# Patient Record
Sex: Female | Born: 1960 | Race: Black or African American | Hispanic: No | Smoking: Never smoker
Health system: Southern US, Community
[De-identification: ages and names within clinical notes are randomized; demographics above are authoritative.]

## PROBLEM LIST (undated history)

## (undated) DIAGNOSIS — Z8489 Family history of other specified conditions: Secondary | ICD-10-CM

## (undated) DIAGNOSIS — D649 Anemia, unspecified: Secondary | ICD-10-CM

## (undated) DIAGNOSIS — M5430 Sciatica, unspecified side: Secondary | ICD-10-CM

## (undated) DIAGNOSIS — M1990 Unspecified osteoarthritis, unspecified site: Secondary | ICD-10-CM

## (undated) DIAGNOSIS — R519 Headache, unspecified: Secondary | ICD-10-CM

## (undated) DIAGNOSIS — E119 Type 2 diabetes mellitus without complications: Secondary | ICD-10-CM

## (undated) DIAGNOSIS — K219 Gastro-esophageal reflux disease without esophagitis: Secondary | ICD-10-CM

## (undated) DIAGNOSIS — M5416 Radiculopathy, lumbar region: Secondary | ICD-10-CM

## (undated) DIAGNOSIS — G894 Chronic pain syndrome: Secondary | ICD-10-CM

## (undated) HISTORY — PX: OTHER SURGICAL HISTORY: SHX169

## (undated) HISTORY — DX: Sciatica, unspecified side: M54.30

## (undated) HISTORY — DX: Radiculopathy, lumbar region: M54.16

---

## 2011-04-17 HISTORY — PX: COLONOSCOPY: SHX174

## 2014-09-20 ENCOUNTER — Encounter: Payer: Self-pay | Admitting: General Practice

## 2014-09-20 ENCOUNTER — Emergency Department
Admission: EM | Admit: 2014-09-20 | Discharge: 2014-09-20 | Disposition: A | Discharge status: Home / Self Care | Payer: Worker's Compensation | Attending: Emergency Medicine | Admitting: Emergency Medicine

## 2014-09-20 DIAGNOSIS — M5442 Lumbago with sciatica, left side: Secondary | ICD-10-CM | POA: Insufficient documentation

## 2014-09-20 DIAGNOSIS — G8929 Other chronic pain: Secondary | ICD-10-CM | POA: Diagnosis not present

## 2014-09-20 DIAGNOSIS — E119 Type 2 diabetes mellitus without complications: Secondary | ICD-10-CM | POA: Diagnosis not present

## 2014-09-20 DIAGNOSIS — M545 Low back pain: Secondary | ICD-10-CM | POA: Diagnosis present

## 2014-09-20 DIAGNOSIS — M549 Dorsalgia, unspecified: Secondary | ICD-10-CM

## 2014-09-20 HISTORY — DX: Type 2 diabetes mellitus without complications: E11.9

## 2014-09-20 MED ORDER — KETOROLAC TROMETHAMINE 60 MG/2ML IM SOLN
INTRAMUSCULAR | Status: AC
  Filled 2014-09-20: qty 2

## 2014-09-20 MED ORDER — ORPHENADRINE CITRATE 30 MG/ML IJ SOLN
INTRAMUSCULAR | Status: AC
  Filled 2014-09-20: qty 2

## 2014-09-20 MED ORDER — KETOROLAC TROMETHAMINE 60 MG/2ML IM SOLN
60.0000 mg | Freq: Once | INTRAMUSCULAR | Status: AC
Start: 2014-09-20 — End: 2014-09-20
  Administered 2014-09-20: 60 mg via INTRAMUSCULAR

## 2014-09-20 MED ORDER — ORPHENADRINE CITRATE 30 MG/ML IJ SOLN
60.0000 mg | Freq: Two times a day (BID) | INTRAMUSCULAR | Status: DC
  Administered 2014-09-20: 60 mg via INTRAMUSCULAR

## 2014-09-20 MED ORDER — CYCLOBENZAPRINE HCL 10 MG PO TABS: 10.0000 mg | ORAL_TABLET | Freq: Three times a day (TID) | ORAL | Status: DC | PRN

## 2014-09-20 MED ORDER — MELOXICAM 15 MG PO TABS: 15.0000 mg | ORAL_TABLET | Freq: Every day | ORAL | Status: DC | PRN

## 2014-09-20 NOTE — ED Notes (Signed)
Pt states that she has back pain since she got injured at the job while lifting heavy stuff. It started in jan 2016 . She  also states today her left leg has a lot of pain with numbness and tingling.  She states that she probably needs a walker.  Pt has seen a chiropractor who told her that she needs an MRI.  Patient  voice comes and goes and states that it started when the accident happened.

## 2014-09-20 NOTE — ED Provider Notes (Signed)
[facility] Emergency Department Provider Note  ____________________________________________  Time seen: Approximately 2:54 PM  I have reviewed the triage vital signs and the nursing notes.   HISTORY  Chief Complaint Back Pain and Sciatica   HPI Meghan Vasquez is a 54 y.o. female presents to ER for complaints of low back pain with intermittent radiation down left lower leg for one day.Patient states history of chronic low back pain which intermittently flares up once or twice per month. Patient states the flareups are usually caused by sitting to long, twisting an awkward position, or too much activity. Patient states that the pain she is currently having onset was this morning. Patient states that while rolling to get out of bed she twisted her leg and lower back which causes increase of pain. Denies fall or direct trauma.  States in January of this year she was at work and lifting a heavy object and had onset of low back pain. Patient states that since that time she has had intermittent flareups of low back pain with intermittent radiation down left leg. Denies other fall or injury. States that this is related to a [employer] injury. Patient states that she has been followed by her primary care physician and chiropractor in [place] but recently moved to [facility] and has not yet established a primary care.  Patient states that she normally takes baclofen and diclofenac for pain however out of his medications.  Patient states pain is primarily with movement. Very mild at rest. Denies chest pain, abdominal pain or shortness of breath. Denies numbness or tingling. Denies urinary incontinence or retention, bowel changes, decreased sensation. Reports continues to ambulate well.   Past Medical History  Diagnosis Date  . Diabetes mellitus without complication     There are no active problems to display for this patient.   History reviewed. No pertinent past  surgical history.  Current Outpatient Rx  Name  Route  Sig  Dispense  Refill  . cyclobenzaprine (FLEXERIL) 10 MG tablet   Oral   Take 1 tablet (10 mg total) by mouth every 8 (eight) hours as needed for muscle spasms (PRN pain. Do not drive or operate heavy machinery while taking as can cause drowsiness.).   12 tablet   0   . meloxicam (MOBIC) 15 MG tablet   Oral   Take 1 tablet (15 mg total) by mouth daily as needed for pain.   10 tablet   0     Allergies Review of patient's allergies indicates no known allergies.  No family history on file.  Social History History  Substance Use Topics  . Smoking status: Never Smoker   . Smokeless tobacco: Not on file  . Alcohol Use: No    Review of Systems Constitutional: No fever/chills Eyes: No visual changes. ENT: No sore throat. Cardiovascular: Denies chest pain. Respiratory: Denies shortness of breath. Gastrointestinal: No abdominal pain.  No nausea, no vomiting.  No diarrhea.  No constipation. Genitourinary: Negative for dysuria. Musculoskeletal: positive for back pain. Skin: Negative for rash. Neurological: Negative for headaches, focal weakness or numbness.  10-point ROS otherwise negative.  ____________________________________________   PHYSICAL EXAM:  VITAL SIGNS: ED Triage Vitals  Enc Vitals Group     BP 09/20/14 1304 135/80 mmHg     Pulse Rate 09/20/14 1304 103     Resp 09/20/14 1304 18     Temp 09/20/14 1304 98.2 F (36.8 C)     Temp Source 09/20/14 1304 Oral  SpO2 09/20/14 1304 98 %     Weight 09/20/14 1304 215 lb (97.523 kg)     Height 09/20/14 1304 5\' 3"  (1.6 m)     Head Cir --      Peak Flow --      Pain Score 09/20/14 1304 6     Pain Loc --      Pain Edu? --      Excl. in [place]? --    Filed Vitals:   09/20/14 1531  BP: 123/88  Pulse: 81  Temp: 98.4 F (36.9 C)  Resp: 18     Constitutional: Alert and oriented. Well appearing and in no acute distress. Eyes: Conjunctivae are normal.  PERRL. EOMI. Head: Atraumatic. Nose: No congestion/rhinnorhea. Mouth/Throat: Mucous membranes are moist.  Oropharynx non-erythematous. Neck: No stridor.  No cervical spine tenderness to palpation. Hematological/Lymphatic/Immunilogical: No cervical lymphadenopathy. Cardiovascular: Normal rate, regular rhythm. Grossly normal heart sounds.  Good peripheral circulation. Respiratory: Normal respiratory effort.  No retractions. Lungs CTAB. Gastrointestinal: Soft and nontender. No distention. No abdominal bruits. No CVA tenderness. Musculoskeletal: No lower extremity tenderness nor edema.  No joint effusions. Bilateral pedal pulses equal and easily found.  No cervical or thoracic tenderness. No CVA tenderness. Mild to moderate mid lumbar and left paralumbar tenderness to palpation. No swelling or ecchymosis. Right straight leg test negative, left straight leg test positive for pain at approximately 30. Sensation to bilateral lower extremities intact and equal. No saddle anesthesia. Full range of motion to the lower extremities and back. Lower back pain increased with rotation and flexion. Ambulatory in room with steady gait.  Neurologic:  Normal speech and language. No gross focal neurologic deficits are appreciated. Speech is normal. No gait instability. Skin:  Skin is warm, dry and intact. No rash noted. Psychiatric: Mood and affect are normal. Speech and behavior are normal.   ____________________________________________   INITIAL IMPRESSION / ASSESSMENT AND PLAN / ED COURSE  Pertinent labs & imaging results that were available during my care of the patient were reviewed by me and considered in my medical decision making (see chart for details).  No acute distress. Very well-appearing patient. Presents to the ER for a complaint of acute on chronic low back pain. Patient states original injury in January of this year, after heavy lifting incident. Patient states intermittent pain since. States  flared up this morning when twisting to get out of bed. Denies fall or other injury. States pain is primarily with movement. Lower mid lumbar and left paralumbar tenderness. Steady gait in room. Neuro intact.  Discussed need for patient to establish primary care in this area and for close follow-up. Discussed follow-up with orthopedic as well as pain management. Discussed chronic pain policy from ER. Will treat IM Toradol and Norflex in ER. Patient agreed to plan. Discussed follow-up and return parameters. ____________________________________________   FINAL CLINICAL IMPRESSION(S) / ED DIAGNOSES  Final diagnoses:  Low back pain with left-sided sciatica, unspecified back pain laterality  Chronic back pain  Acute on chronic low back pain    Marylene Land, NP 09/20/14 1555  Lavonia Drafts, MD 09/20/14 [phone]

## 2014-09-20 NOTE — ED Notes (Signed)
Pt. Arrived to ed from home with reports of work injury back in January. Pt reports she has recently moved from [place] and need to follow up due to pain. Pt reports being told is sciatica pain. Pt. Reports lower back pain that radiates down both leg. Pt ambulatory.

## 2014-09-20 NOTE — Discharge Instructions (Signed)
Take medication as prescribed. Alternate heat and ice for comfort. Avoid strenuous activity or lifting. Always use proper body mechanics when lifting.  Establish a primary care physician for regular follow-up and follow-up next week. See above for follow-up next week.  Return to the ER for new or worsening concerns.  Back Pain, Adult Back pain is very common. The pain often gets better over time. The cause of back pain is usually not dangerous. Most people can learn to manage their back pain on their own.  HOME CARE   Stay active. Start with short walks on flat ground if you can. Try to walk farther each day.  Do not sit, drive, or stand in one place for more than 30 minutes. Do not stay in bed.  Do not avoid exercise or work. Activity can help your back heal faster.  Be careful when you bend or lift an object. Bend at your knees, keep the object close to you, and do not twist.  Sleep on a firm mattress. Lie on your side, and bend your knees. If you lie on your back, put a pillow under your knees.  Only take medicines as told by your doctor.  Put ice on the injured area.  Put ice in a plastic bag.  Place a towel between your skin and the bag.  Leave the ice on for 15-20 minutes, 03-04 times a day for the first 2 to 3 days. After that, you can switch between ice and heat packs.  Ask your doctor about back exercises or massage.  Avoid feeling anxious or stressed. Find good ways to deal with stress, such as exercise. GET HELP RIGHT AWAY IF:   Your pain does not go away with rest or medicine.  Your pain does not go away in 1 week.  You have new problems.  You do not feel well.  The pain spreads into your legs.  You cannot control when you poop (bowel movement) or pee (urinate).  Your arms or legs feel weak or lose feeling (numbness).  You feel sick to your stomach (nauseous) or throw up (vomit).  You have belly (abdominal) pain.  You feel like you may pass out  (faint). MAKE SURE YOU:   Understand these instructions.  Will watch your condition.  Will get help right away if you are not doing well or get worse. Document Released: 09/19/2007 Document Revised: 06/25/2011 Document Reviewed: 08/04/2013 [facility] Patient Information 2015 [place], [place]. This information is not intended to replace advice given to you by your health care provider. Make sure you discuss any questions you have with your health care provider.  Chronic Back Pain  When back pain lasts longer than 3 months, it is called chronic back pain.People with chronic back pain often go through certain periods that are more intense (flare-ups).  CAUSES Chronic back pain can be caused by wear and tear (degeneration) on different structures in your back. These structures include:  The bones of your spine (vertebrae) and the joints surrounding your spinal cord and nerve roots (facets).  The strong, fibrous tissues that connect your vertebrae (ligaments). Degeneration of these structures may result in pressure on your nerves. This can lead to constant pain. HOME CARE INSTRUCTIONS  Avoid bending, heavy lifting, prolonged sitting, and activities which make the problem worse.  Take brief periods of rest throughout the day to reduce your pain. Lying down or standing usually is better than sitting while you are resting.  Take over-the-counter or prescription medicines only  as directed by your caregiver. SEEK IMMEDIATE MEDICAL CARE IF:   You have weakness or numbness in one of your legs or feet.  You have trouble controlling your bladder or bowels.  You have nausea, vomiting, abdominal pain, shortness of breath, or fainting. Document Released: 05/10/2004 Document Revised: 06/25/2011 Document Reviewed: 03/17/2011 [facility] Patient Information 2015 [place], [place]. This information is not intended to replace advice given to you by your health care provider. Make sure you discuss any questions  you have with your health care provider.

## 2014-10-05 ENCOUNTER — Other Ambulatory Visit: Payer: Self-pay | Admitting: Family Medicine

## 2014-10-05 DIAGNOSIS — M5416 Radiculopathy, lumbar region: Secondary | ICD-10-CM

## 2014-10-09 ENCOUNTER — Ambulatory Visit: Payer: BLUE CROSS/BLUE SHIELD

## 2014-10-25 ENCOUNTER — Ambulatory Visit
Admission: RE | Admit: 2014-10-25 | Discharge: 2014-10-25 | Disposition: A | Discharge status: Home / Self Care | Payer: Worker's Compensation | Source: Ambulatory Visit | Attending: Family Medicine | Admitting: Family Medicine

## 2014-10-25 DIAGNOSIS — X58XXXA Exposure to other specified factors, initial encounter: Secondary | ICD-10-CM | POA: Diagnosis not present

## 2014-10-25 DIAGNOSIS — M47896 Other spondylosis, lumbar region: Secondary | ICD-10-CM | POA: Diagnosis not present

## 2014-10-25 DIAGNOSIS — T1490 Injury, unspecified: Secondary | ICD-10-CM | POA: Insufficient documentation

## 2014-10-25 DIAGNOSIS — R269 Unspecified abnormalities of gait and mobility: Secondary | ICD-10-CM | POA: Diagnosis not present

## 2014-10-25 DIAGNOSIS — M5416 Radiculopathy, lumbar region: Secondary | ICD-10-CM | POA: Insufficient documentation

## 2014-10-25 DIAGNOSIS — M545 Low back pain: Secondary | ICD-10-CM | POA: Diagnosis present

## 2015-01-31 ENCOUNTER — Ambulatory Visit (INDEPENDENT_AMBULATORY_CARE_PROVIDER_SITE_OTHER): Payer: 59 | Admitting: Family Medicine

## 2015-01-31 ENCOUNTER — Encounter: Payer: Self-pay | Admitting: Family Medicine

## 2015-01-31 ENCOUNTER — Telehealth: Payer: Self-pay | Admitting: Family Medicine

## 2015-01-31 VITALS — BP 124/77 | HR 88 | Temp 98.3°F | Ht 63.0 in | Wt 212.0 lb

## 2015-01-31 DIAGNOSIS — Z23 Encounter for immunization: Secondary | ICD-10-CM | POA: Diagnosis not present

## 2015-01-31 DIAGNOSIS — R110 Nausea: Secondary | ICD-10-CM

## 2015-01-31 DIAGNOSIS — N951 Menopausal and female climacteric states: Secondary | ICD-10-CM | POA: Diagnosis not present

## 2015-01-31 DIAGNOSIS — N971 Female infertility of tubal origin: Secondary | ICD-10-CM | POA: Diagnosis not present

## 2015-01-31 DIAGNOSIS — E1122 Type 2 diabetes mellitus with diabetic chronic kidney disease: Secondary | ICD-10-CM | POA: Insufficient documentation

## 2015-01-31 DIAGNOSIS — N912 Amenorrhea, unspecified: Secondary | ICD-10-CM | POA: Diagnosis not present

## 2015-01-31 DIAGNOSIS — E119 Type 2 diabetes mellitus without complications: Secondary | ICD-10-CM

## 2015-01-31 DIAGNOSIS — Z113 Encounter for screening for infections with a predominantly sexual mode of transmission: Secondary | ICD-10-CM

## 2015-01-31 LAB — MICROALBUMIN, URINE WAIVED
Creatinine, Urine Waived: 200 mg/dL (ref 10–300)
Microalb, Ur Waived: 10 mg/L (ref 0–19)
Microalb/Creat Ratio: <30 mg/g (ref <30)

## 2015-01-31 LAB — BAYER DCA HB A1C WAIVED: HB A1C (BAYER DCA - WAIVED): 6.3 % (ref <7.0)

## 2015-01-31 LAB — PREGNANCY, URINE: Preg Test, Ur: NEGATIVE

## 2015-01-31 NOTE — Patient Instructions (Signed)

## 2015-01-31 NOTE — Assessment & Plan Note (Signed)
Likely the cause of her nausea and symptoms. Information given today. Await results of blood work. Referral to GYN made today as she would like to discuss her chances of getting pregnant with them.

## 2015-01-31 NOTE — Progress Notes (Signed)
BP 124/77 mmHg  Pulse 88  Temp(Src) 98.3 F (36.8 C)  Ht 5\' 3"  (1.6 m)  Wt 212 lb (96.163 kg)  BMI 37.56 kg/m2  SpO2 97%  LMP 01/18/2015   Subjective:    Patient ID: Meghan Vasquez, female    DOB: 21-Mar-1961, 54 y.o.   MRN: [id number]  HPI: Meghan Vasquez is a 54 y.o. female who presents today to establish care. She moved from [place] in April. She sees someone for health screening but hasn't seen anyone else since then.   Chief Complaint  Patient presents with  . Nausea    Patient states that she has been having pregnancy symptoms. Cravings,nausea, all symptoms. Patient states that she had a very heavy period last week.    1 week has been having nausea and strange symptoms. Has been having issues with nausea and increased sense of smell. Has a history of ectopic pregnancy, only has 1 tube  Nausea Duration: About a month Nature: No pain Frequency: intermittent in the morning and at night Alleviating factors: nothing Aggravating factors: smells Treatments attempted: none Constipation: intermittent Diarrhea: yes Mucous in the stool: no Heartburn: yes Bloating:no Flatulence: yes Nausea: yes Vomiting: no Melena or hematochezia: no Rash: no Jaundice: no Fever: no Weight loss: no   ABNORMAL MENSTRUAL PERIODS Average interval between menses: 28-31 days Length of menses: 3-5 days Flow: light Dysmenorrhea: no Intermenstrual bleeding:no Postcoital bleeding: no Sexual activity: In a Monogamous Relationship History GYN procedures: yes Abnormal pap smears: no   Dyspareunia: no Vaginal discharge: yes Abdominal pain: no Galactorrhea: no Frequent bruising/mucosal bleeding: no Double vision:no Hot flashes: yes- every night  DIABETES Hypoglycemic episodes:no Polydipsia/polyuria: no Visual disturbance: no Chest pain: no Paresthesias: no Glucose Monitoring: yes  Accucheck frequency: BID Taking Insulin?: no Blood Pressure Monitoring: a few times a day Retinal  Examination: Up to Date- just got new glasses Foot Exam: Up to Date Diabetic Education: Completed Pneumovax: Not up to Date Influenza: Not up to Date Aspirin: no  Relevant past medical, surgical, family and social history reviewed and updated as indicated. Interim medical history since our last visit reviewed. Allergies and medications reviewed and updated.  Review of Systems  Constitutional: Negative.   HENT: Negative.   Respiratory: Negative.   Cardiovascular: Negative.   Gastrointestinal: Positive for nausea. Negative for vomiting, abdominal pain, diarrhea, constipation, blood in stool, abdominal distention, anal bleeding and rectal pain.  Genitourinary: Negative.   Psychiatric/Behavioral: Negative.     Per HPI unless specifically indicated above     Objective:    BP 124/77 mmHg  Pulse 88  Temp(Src) 98.3 F (36.8 C)  Ht 5\' 3"  (1.6 m)  Wt 212 lb (96.163 kg)  BMI 37.56 kg/m2  SpO2 97%  LMP 01/18/2015  Wt Readings from Last 3 Encounters:  01/31/15 212 lb (96.163 kg)  10/25/14 219 lb (99.338 kg)  09/20/14 215 lb (97.523 kg)    Physical Exam  Constitutional: She is oriented to person, place, and time. She appears well-developed and well-nourished. No distress.  HENT:  Head: Normocephalic and atraumatic.  Right Ear: Hearing normal.  Left Ear: Hearing normal.  Nose: Nose normal.  Eyes: Conjunctivae and lids are normal. Right eye exhibits no discharge. Left eye exhibits no discharge. No scleral icterus.  Cardiovascular: Normal rate, regular rhythm, normal heart sounds and intact distal pulses.  Exam reveals no gallop and no friction rub.   No murmur heard. Pulmonary/Chest: Effort normal and breath sounds normal. No respiratory distress. She has no wheezes.  She has no rales. She exhibits no tenderness.  Abdominal: Soft. Bowel sounds are normal. She exhibits no distension and no mass. There is no tenderness. There is no rebound and no guarding.  Musculoskeletal: Normal  range of motion.  Neurological: She is alert and oriented to person, place, and time.  Skin: Skin is warm, dry and intact. No rash noted. No erythema. No pallor.  Psychiatric: She has a normal mood and affect. Her speech is normal and behavior is normal. Judgment and thought content normal. Cognition and memory are normal.  Nursing note and vitals reviewed.   Results for orders placed or performed in visit on 01/31/15  Pregnancy, urine  Result Value Ref Range   Preg Test, Ur Negative Negative  Bayer DCA Hb A1c Waived  Result Value Ref Range   Bayer DCA Hb A1c Waived 6.3 <7.0 %      Assessment & Plan:   Problem List Items Addressed This Visit      Endocrine   Diabetes mellitus without complication (HCC)    W6F 6.3 today. Under great control. Continue to monitor. Continue current regimen.       Relevant Medications   metFORMIN (GLUCOPHAGE) 500 MG tablet   Other Relevant Orders   Microalbumin, Urine Waived     Genitourinary   Infertility of tubal origin    Had tube removed due to ectopic in 1989. Would like to get pregnant, but knows at 51 it is unlikely. Would like to speak to GYN regarding her options.       Relevant Orders   Ambulatory referral to Gynecology     Other   Perimenopause    Likely the cause of her nausea and symptoms. Information given today. Await results of blood work. Referral to GYN made today as she would like to discuss her chances of getting pregnant with them.        Other Visit Diagnoses    Amenorrhea    -  Primary    Pregnancy negative. Likely due to perimenopause.     Relevant Orders    Pregnancy, urine (Completed)    TSH    CBC with Differential/Platelet    Comprehensive metabolic panel    Bayer DCA Hb A1c Waived    LH    FSH    Prolactin    Immunization due        Relevant Orders    Flu Vaccine QUAD 36+ mos PF IM (Fluarix & Fluzone Quad PF)    Nausea        Possibly picked up a bug, possibly hormonal. Gone now. Monitor. Call if not  getting better or getting worse.         Follow up plan: Return in about 3 months (around 05/03/2015) for Physical.

## 2015-01-31 NOTE — Telephone Encounter (Signed)
Patient came back on her own. Shots given.

## 2015-01-31 NOTE — Telephone Encounter (Signed)
She left before she got her shots- can we get her back in just for a shot visit?

## 2015-01-31 NOTE — Assessment & Plan Note (Signed)
Had tube removed due to ectopic in 1989. Would like to get pregnant, but knows at 32 it is unlikely. Would like to speak to GYN regarding her options.

## 2015-01-31 NOTE — Assessment & Plan Note (Signed)
A1c 6.3 today. Under great control. Continue to monitor. Continue current regimen.

## 2015-02-01 ENCOUNTER — Telehealth: Payer: Self-pay | Admitting: Family Medicine

## 2015-02-01 DIAGNOSIS — D649 Anemia, unspecified: Secondary | ICD-10-CM

## 2015-02-01 LAB — CBC WITH DIFFERENTIAL/PLATELET
BASOS: 0 %
Basophils Absolute: 0 10*3/uL (ref 0.0–0.2)
EOS (ABSOLUTE): 0.1 10*3/uL (ref 0.0–0.4)
Eos: 1 %
HEMOGLOBIN: 10.4 g/dL — AB (ref 11.1–15.9)
Hematocrit: 32.1 % — ABNORMAL LOW (ref 34.0–46.6)
IMMATURE GRANS (ABS): 0 10*3/uL (ref 0.0–0.1)
Immature Granulocytes: 0 %
LYMPHS ABS: 2.5 10*3/uL (ref 0.7–3.1)
Lymphs: 32 %
MCH: 29.5 pg (ref 26.6–33.0)
MCHC: 32.4 g/dL (ref 31.5–35.7)
MCV: 91 fL (ref 79–97)
Monocytes Absolute: 0.3 10*3/uL (ref 0.1–0.9)
Monocytes: 4 %
NEUTROS ABS: 4.9 10*3/uL (ref 1.4–7.0)
Neutrophils: 63 %
PLATELETS: 322 10*3/uL (ref 150–379)
RBC: 3.53 x10E6/uL — ABNORMAL LOW (ref 3.77–5.28)
RDW: 14.5 % (ref 12.3–15.4)
WBC: 7.8 10*3/uL (ref 3.4–10.8)

## 2015-02-01 LAB — COMPREHENSIVE METABOLIC PANEL
A/G RATIO: 1.7 (ref 1.1–2.5)
ALBUMIN: 4.3 g/dL (ref 3.5–5.5)
ALK PHOS: 60 IU/L (ref 39–117)
ALT: 15 IU/L (ref 0–32)
AST: 14 IU/L (ref 0–40)
BILIRUBIN TOTAL: 0.3 mg/dL (ref 0.0–1.2)
BUN / CREAT RATIO: 11 (ref 9–23)
BUN: 15 mg/dL (ref 6–24)
CHLORIDE: 102 mmol/L (ref 97–106)
CO2: 26 mmol/L (ref 18–29)
Calcium: 9.6 mg/dL (ref 8.7–10.2)
Creatinine, Ser: 1.33 mg/dL — ABNORMAL HIGH (ref 0.57–1.00)
GFR calc non Af Amer: 46 mL/min/{1.73_m2} — ABNORMAL LOW (ref >59)
GFR, EST AFRICAN AMERICAN: 53 mL/min/{1.73_m2} — AB (ref >59)
GLUCOSE: 88 mg/dL (ref 65–99)
Globulin, Total: 2.6 g/dL (ref 1.5–4.5)
POTASSIUM: 4.1 mmol/L (ref 3.5–5.2)
Sodium: 141 mmol/L (ref 136–144)
TOTAL PROTEIN: 6.9 g/dL (ref 6.0–8.5)

## 2015-02-01 LAB — PROLACTIN: Prolactin: 12.8 ng/mL (ref 4.8–23.3)

## 2015-02-01 LAB — FOLLICLE STIMULATING HORMONE: FSH: 45.7 m[IU]/mL

## 2015-02-01 LAB — HIV ANTIBODY (ROUTINE TESTING W REFLEX): HIV Screen 4th Generation wRfx: NONREACTIVE

## 2015-02-01 LAB — TSH: TSH: 0.736 u[IU]/mL (ref 0.450–4.500)

## 2015-02-01 LAB — LUTEINIZING HORMONE: LH: 14 m[IU]/mL

## 2015-02-01 LAB — HEPATITIS C ANTIBODY: Hep C Virus Ab: <0.1 s/co ratio (ref 0.0–0.9)

## 2015-02-01 MED ORDER — FERROUS SULFATE 325 (65 FE) MG PO TBEC: 325.0000 mg | DELAYED_RELEASE_TABLET | Freq: Every day | ORAL | Status: DC

## 2015-02-01 NOTE — Telephone Encounter (Signed)
Called and discussed results with Dekisha. Slightly anemic- had to take iron previously. Rx for iron sent to her pharmacy. Elevated creatinine. Will get records from [place] to see what her baseline is. Will work on increasing fluids and recheck next visit. Other labs normal. Perimenopausal.

## 2015-02-14 ENCOUNTER — Encounter: Payer: Self-pay | Admitting: Family Medicine

## 2015-02-14 ENCOUNTER — Ambulatory Visit (INDEPENDENT_AMBULATORY_CARE_PROVIDER_SITE_OTHER): Payer: 59 | Admitting: Family Medicine

## 2015-02-14 VITALS — BP 122/83 | HR 88 | Temp 98.6°F | Ht 63.0 in | Wt 213.6 lb

## 2015-02-14 DIAGNOSIS — L918 Other hypertrophic disorders of the skin: Secondary | ICD-10-CM

## 2015-02-14 NOTE — Progress Notes (Signed)
BP 122/83 mmHg  Pulse 88  Temp(Src) 98.6 F (37 C)  Ht 5\' 3"  (1.6 m)  Wt 213 lb 9.6 oz (96.888 kg)  BMI 37.85 kg/m2  SpO2 100%  LMP 01/31/2015 (Exact Date)   Subjective:    Patient ID: Meghan Vasquez, female    DOB: 1960/10/12, 54 y.o.   MRN: [id number]  HPI: Meghan Vasquez is a 54 y.o. female  Chief Complaint  Patient presents with  . Nevus    left inner thigh   SKIN LESION Duration: chronic, many years Location: inner thighs, bother her when bathing Painful: no Itching: no Onset: gradual Context: bigger Associated signs and symptoms: none History of skin cancer: no History of precancerous skin lesions: no Family history of skin cancer: no  Relevant past medical, surgical, family and social history reviewed and updated as indicated. Interim medical history since our last visit reviewed. Allergies and medications reviewed and updated.  Review of Systems  Constitutional: Negative.   Respiratory: Negative.   Cardiovascular: Negative.   Musculoskeletal: Negative.   Skin: Negative.   Psychiatric/Behavioral: Negative.     Per HPI unless specifically indicated above     Objective:    BP 122/83 mmHg  Pulse 88  Temp(Src) 98.6 F (37 C)  Ht 5\' 3"  (1.6 m)  Wt 213 lb 9.6 oz (96.888 kg)  BMI 37.85 kg/m2  SpO2 100%  LMP 01/31/2015 (Exact Date)  Wt Readings from Last 3 Encounters:  02/14/15 213 lb 9.6 oz (96.888 kg)  01/31/15 212 lb (96.163 kg)  10/25/14 219 lb (99.338 kg)    Physical Exam  Constitutional: She is oriented to person, place, and time. She appears well-developed and well-nourished. No distress.  HENT:  Head: Normocephalic and atraumatic.  Right Ear: Hearing normal.  Left Ear: Hearing normal.  Nose: Nose normal.  Eyes: Conjunctivae and lids are normal. Right eye exhibits no discharge. Left eye exhibits no discharge. No scleral icterus.  Pulmonary/Chest: Effort normal. No respiratory distress.  Musculoskeletal: Normal range of motion.  Neurological:  She is alert and oriented to person, place, and time.  Skin: Skin is warm, dry and intact. No rash noted. No erythema. No pallor.  2 large skin tags on inner L thigh  Psychiatric: She has a normal mood and affect. Her speech is normal and behavior is normal. Judgment and thought content normal. Cognition and memory are normal.    Results for orders placed or performed in visit on 01/31/15  Pregnancy, urine  Result Value Ref Range   Preg Test, Ur Negative Negative  TSH  Result Value Ref Range   TSH 0.736 0.450 - 4.500 uIU/mL  CBC with Differential/Platelet  Result Value Ref Range   WBC 7.8 3.4 - 10.8 x10E3/uL   RBC 3.53 (L) 3.77 - 5.28 x10E6/uL   Hemoglobin 10.4 (L) 11.1 - 15.9 g/dL   Hematocrit 32.1 (L) 34.0 - 46.6 %   MCV 91 79 - 97 fL   MCH 29.5 26.6 - 33.0 pg   MCHC 32.4 31.5 - 35.7 g/dL   RDW 14.5 12.3 - 15.4 %   Platelets 322 150 - 379 x10E3/uL   Neutrophils 63 %   Lymphs 32 %   Monocytes 4 %   Eos 1 %   Basos 0 %   Neutrophils Absolute 4.9 1.4 - 7.0 x10E3/uL   Lymphocytes Absolute 2.5 0.7 - 3.1 x10E3/uL   Monocytes Absolute 0.3 0.1 - 0.9 x10E3/uL   EOS (ABSOLUTE) 0.1 0.0 - 0.4 x10E3/uL   Basophils Absolute  0.0 0.0 - 0.2 x10E3/uL   Immature Granulocytes 0 %   Immature Grans (Abs) 0.0 0.0 - 0.1 x10E3/uL  Comprehensive metabolic panel  Result Value Ref Range   Glucose 88 65 - 99 mg/dL   BUN 15 6 - 24 mg/dL   Creatinine, Ser 1.33 (H) 0.57 - 1.00 mg/dL   GFR calc non Af Amer 46 (L) >59 mL/min/1.73   GFR calc Af Amer 53 (L) >59 mL/min/1.73   BUN/Creatinine Ratio 11 9 - 23   Sodium 141 136 - 144 mmol/L   Potassium 4.1 3.5 - 5.2 mmol/L   Chloride 102 97 - 106 mmol/L   CO2 26 18 - 29 mmol/L   Calcium 9.6 8.7 - 10.2 mg/dL   Total Protein 6.9 6.0 - 8.5 g/dL   Albumin 4.3 3.5 - 5.5 g/dL   Globulin, Total 2.6 1.5 - 4.5 g/dL   Albumin/Globulin Ratio 1.7 1.1 - 2.5   Bilirubin Total 0.3 0.0 - 1.2 mg/dL   Alkaline Phosphatase 60 39 - 117 IU/L   AST 14 0 - 40 IU/L   ALT 15  0 - 32 IU/L  LH  Result Value Ref Range   LH 14.0 mIU/mL  FSH  Result Value Ref Range   FSH 45.7 mIU/mL  Prolactin  Result Value Ref Range   Prolactin 12.8 4.8 - 23.3 ng/mL  Microalbumin, Urine Waived  Result Value Ref Range   Microalb, Ur Waived 10 0 - 19 mg/L   Creatinine, Urine Waived 200 10 - 300 mg/dL   Microalb/Creat Ratio <30 <30 mg/g  Bayer DCA Hb A1c Waived  Result Value Ref Range   Bayer DCA Hb A1c Waived 6.3 <7.0 %  HIV antibody  Result Value Ref Range   HIV Screen 4th Generation wRfx Non Reactive Non Reactive  Hepatitis C antibody  Result Value Ref Range   Hep C Virus Ab <0.1 0.0 - 0.9 s/co ratio      Assessment & Plan:   Problem List Items Addressed This Visit    None    Visit Diagnoses    Cutaneous skin tags    -  Primary    Irritating her. Will remove today.       Skin Procedure  Procedure: Informed consent given.  Sterile prep of the area.  Area infiltrated with lidocaine with epinephrine.  Using a surgical blade, part of the upper dermis shaved off and sent  for pathology.  Area cauterized. Pt ed on scarring.     Diagnosis:   ICD-9-CM ICD-10-CM   1. Cutaneous skin tags 701.9 L91.8    Irritating her. Will remove today.    Lesion Location/Size: 3cm 6 inches down from labia, 1.5 cm 2 inches down from labia, both on L inner thigh Physician: MJ Consent:  Risks, benefits, and alternative treatments discussed and all questions were answered.  Patient elected to proceed and verbal consent obtained.  Description: Area prepped and draped using semi-sterile technique. Areas locally anesthetized using 4.5 cc's of lidocaine 2% with epi. Skin tags removed with scissors.  Adequate hemostastis achieved using Silver Nitrate.  Post Procedure Instructions: Wound care instructions discussed and patient was instructed to keep area clean and dry.  Signs and symptoms of infection discussed, patient agrees to contact the office ASAP should they occur.  Dressing change  recommended daily.   Follow up plan: Return As scheduled.

## 2015-02-14 NOTE — Patient Instructions (Signed)
Skin Biopsy WHY AM I HAVING THIS TEST? This test examines skin tissue under a microscope. Your health care provider may perform this test to identify the source of a skin rash or lesion if an allergy is suspected. In this test, a tissue sample (biopsy) is taken to look at your skin's anatomy and to see if certain proteins and antibodies are present. WHAT KIND OF SAMPLE IS TAKEN? A small sample of skin is required for this test. It is usually collected by numbing an area of skin and removing a small circle of skin. HOW DO I PREPARE FOR THE TEST? There is no preparation required for this test. HOW ARE THE TEST RESULTS REPORTED? Your results may be reported in different ways and are dependent upon the kind of test that is performed. It is your responsibility to obtain your test results. Ask the lab or department performing the test when and how you will get your results. It is most common for your results to be reported as:  Normal skin anatomy (histology).  No evidence of IgG, IgA, or IgM antibody, complement C3, or fibrinogen. WHAT DO THE RESULTS MEAN? Abnormal findings may indicate certain autoimmune diseases. This test can produce many different results. It is important to talk with your health care provider to discuss your results, treatment options, and if necessary, the need for more tests. Talk with your health care provider if you have any questions about your results.   This information is not intended to replace advice given to you by your health care provider. Make sure you discuss any questions you have with your health care provider.   Document Released: 05/04/2004 Document Revised: 04/23/2014 Document Reviewed: 06/30/2014 Elsevier Interactive Patient Education [employer].

## 2015-03-14 ENCOUNTER — Encounter: Payer: Self-pay | Admitting: Family Medicine

## 2015-03-14 ENCOUNTER — Ambulatory Visit (INDEPENDENT_AMBULATORY_CARE_PROVIDER_SITE_OTHER): Payer: 59 | Admitting: Family Medicine

## 2015-03-14 VITALS — BP 120/82 | HR 85 | Temp 98.4°F | Ht 63.0 in | Wt 212.0 lb

## 2015-03-14 DIAGNOSIS — L720 Epidermal cyst: Secondary | ICD-10-CM | POA: Diagnosis not present

## 2015-03-14 DIAGNOSIS — Z1239 Encounter for other screening for malignant neoplasm of breast: Secondary | ICD-10-CM | POA: Diagnosis not present

## 2015-03-14 DIAGNOSIS — E162 Hypoglycemia, unspecified: Secondary | ICD-10-CM

## 2015-03-14 LAB — GLUCOSE HEMOCUE WAIVED: GLU HEMOCUE WAIVED: 120 mg/dL — AB (ref 65–99)

## 2015-03-14 NOTE — Patient Instructions (Signed)
Hypoglycemia Low blood sugar (hypoglycemia) means that the level of sugar in your blood is lower than it should be. Signs of low blood sugar include:  Getting sweaty.  Feeling hungry.  Feeling dizzy or weak.  Feeling sleepier than normal.  Feeling nervous.  Headaches.  Having a fast heartbeat. Low blood sugar can happen fast and can be an emergency. Your doctor can do tests to check your blood sugar level. You can have low blood sugar and not have diabetes. HOME CARE  Check your blood sugar as told by your doctor. If it is less than 70 mg/dl or as told by your doctor, take 1 of the following:  3 to 4 glucose tablets.   cup clear juice.   cup soda pop, not diet.  1 cup milk.  5 to 6 hard candies.  Recheck blood sugar after 15 minutes. Repeat until it is at the right level.  Eat a snack if it is more than 1 hour until the next meal.  Only take medicine as told by your doctor.  Do not skip meals. Eat on time.  Do not drink alcohol except with meals.  Check your blood glucose before driving.  Check your blood glucose before and after exercise.  Always carry treatment with you, such as glucose pills.  Always wear a medical alert bracelet if you have diabetes. GET HELP RIGHT AWAY IF:   Your blood glucose goes below 70 mg/dl or as told by your doctor, and you:  Are confused.  Are not able to swallow.  Pass out (faint).  You cannot treat yourself. You may need someone to help you.  You have low blood sugar problems often.  You have problems from your medicines.  You are not feeling better after 3 to 4 days.  You have vision changes. MAKE SURE YOU:   Understand these instructions.  Will watch this condition.  Will get help right away if you are not doing well or get worse.   This information is not intended to replace advice given to you by your health care provider. Make sure you discuss any questions you have with your health care provider.     Document Released: 06/27/2009 Document Revised: 04/23/2014 Document Reviewed: 12/07/2014 Elsevier Interactive Patient Education 2016 Elsevier Inc.  

## 2015-03-14 NOTE — Progress Notes (Signed)
BP 120/82 mmHg  Pulse 85  Temp(Src) 98.4 F (36.9 C)  Ht 5\' 3"  (1.6 m)  Wt 212 lb (96.163 kg)  BMI 37.56 kg/m2  SpO2 98%  LMP 01/31/2015 (Exact Date)   Subjective:    Patient ID: Meghan Vasquez, female    DOB: 09/23/1960, 54 y.o.   MRN: [id number]  HPI: Meghan Vasquez is a 54 y.o. female  Chief Complaint  Patient presents with  . Diabetes    Patient states that her blood sugar has been up and down, it has gotten as low as 59, she states that she has had some lightheadness and just has not felt well.   . skin lesion    left side of chest, patient states that she thought that it was a black head and has still not went away.   DIABETES- still feeling nauseous, off and on. Gets sweats and light headed. Checked her sugar and it was 59. Highest sugar she got was 130. Happens every day. Usually in the morning around 10PM. Eats breakfast between 8-10. Has oatmeal for breakfast at that time. Has been going on a couple of months. Has vomited a couple of times Hypoglycemic episodes:yes Polydipsia/polyuria: no Visual disturbance: no Chest pain: no Paresthesias: no Glucose Monitoring: yes  Accucheck frequency: Daily Blood Pressure Monitoring: daily  Relevant past medical, surgical, family and social history reviewed and updated as indicated. Interim medical history since our last visit reviewed. Allergies and medications reviewed and updated.  Review of Systems  Constitutional: Negative.   Respiratory: Negative.   Cardiovascular: Negative.   Neurological: Positive for dizziness and light-headedness. Negative for tremors, seizures, syncope, facial asymmetry, speech difficulty, weakness, numbness and headaches.  Psychiatric/Behavioral: Negative.     Per HPI unless specifically indicated above     Objective:    BP 120/82 mmHg  Pulse 85  Temp(Src) 98.4 F (36.9 C)  Ht 5\' 3"  (1.6 m)  Wt 212 lb (96.163 kg)  BMI 37.56 kg/m2  SpO2 98%  LMP 01/31/2015 (Exact Date)  Wt Readings from  Last 3 Encounters:  03/14/15 212 lb (96.163 kg)  02/14/15 213 lb 9.6 oz (96.888 kg)  01/31/15 212 lb (96.163 kg)    Physical Exam  Constitutional: She is oriented to person, place, and time. She appears well-developed and well-nourished. No distress.  HENT:  Head: Normocephalic and atraumatic.  Right Ear: Hearing normal.  Left Ear: Hearing normal.  Nose: Nose normal.  Eyes: Conjunctivae, EOM and lids are normal. Pupils are equal, round, and reactive to light. Right eye exhibits no discharge. Left eye exhibits no discharge. No scleral icterus. Right eye exhibits no nystagmus. Left eye exhibits no nystagmus.  Cardiovascular: Normal rate, regular rhythm, normal heart sounds and intact distal pulses.  Exam reveals no gallop and no friction rub.   No murmur heard. Pulmonary/Chest: Effort normal and breath sounds normal. No respiratory distress. She has no wheezes. She has no rales. She exhibits no tenderness.  Musculoskeletal: Normal range of motion. She exhibits no edema or tenderness.  Neurological: She is alert and oriented to person, place, and time. She has normal reflexes. She displays normal reflexes. No cranial nerve deficit. She exhibits normal muscle tone. Coordination normal.  Skin: Skin is warm, dry and intact. No rash noted. No erythema. No pallor.  Small inclusion cyst on R chest wall 3inches below sternoclavicular joint, 0.3 cm soft and round  Psychiatric: She has a normal mood and affect. Her speech is normal and behavior is normal. Judgment and thought  content normal. Cognition and memory are normal.  Nursing note and vitals reviewed.   Results for orders placed or performed in visit on 01/31/15  Pregnancy, urine  Result Value Ref Range   Preg Test, Ur Negative Negative  TSH  Result Value Ref Range   TSH 0.736 0.450 - 4.500 uIU/mL  CBC with Differential/Platelet  Result Value Ref Range   WBC 7.8 3.4 - 10.8 x10E3/uL   RBC 3.53 (L) 3.77 - 5.28 x10E6/uL   Hemoglobin 10.4  (L) 11.1 - 15.9 g/dL   Hematocrit 32.1 (L) 34.0 - 46.6 %   MCV 91 79 - 97 fL   MCH 29.5 26.6 - 33.0 pg   MCHC 32.4 31.5 - 35.7 g/dL   RDW 14.5 12.3 - 15.4 %   Platelets 322 150 - 379 x10E3/uL   Neutrophils 63 %   Lymphs 32 %   Monocytes 4 %   Eos 1 %   Basos 0 %   Neutrophils Absolute 4.9 1.4 - 7.0 x10E3/uL   Lymphocytes Absolute 2.5 0.7 - 3.1 x10E3/uL   Monocytes Absolute 0.3 0.1 - 0.9 x10E3/uL   EOS (ABSOLUTE) 0.1 0.0 - 0.4 x10E3/uL   Basophils Absolute 0.0 0.0 - 0.2 x10E3/uL   Immature Granulocytes 0 %   Immature Grans (Abs) 0.0 0.0 - 0.1 x10E3/uL  Comprehensive metabolic panel  Result Value Ref Range   Glucose 88 65 - 99 mg/dL   BUN 15 6 - 24 mg/dL   Creatinine, Ser 1.33 (H) 0.57 - 1.00 mg/dL   GFR calc non Af Amer 46 (L) >59 mL/min/1.73   GFR calc Af Amer 53 (L) >59 mL/min/1.73   BUN/Creatinine Ratio 11 9 - 23   Sodium 141 136 - 144 mmol/L   Potassium 4.1 3.5 - 5.2 mmol/L   Chloride 102 97 - 106 mmol/L   CO2 26 18 - 29 mmol/L   Calcium 9.6 8.7 - 10.2 mg/dL   Total Protein 6.9 6.0 - 8.5 g/dL   Albumin 4.3 3.5 - 5.5 g/dL   Globulin, Total 2.6 1.5 - 4.5 g/dL   Albumin/Globulin Ratio 1.7 1.1 - 2.5   Bilirubin Total 0.3 0.0 - 1.2 mg/dL   Alkaline Phosphatase 60 39 - 117 IU/L   AST 14 0 - 40 IU/L   ALT 15 0 - 32 IU/L  LH  Result Value Ref Range   LH 14.0 mIU/mL  FSH  Result Value Ref Range   FSH 45.7 mIU/mL  Prolactin  Result Value Ref Range   Prolactin 12.8 4.8 - 23.3 ng/mL  Microalbumin, Urine Waived  Result Value Ref Range   Microalb, Ur Waived 10 0 - 19 mg/L   Creatinine, Urine Waived 200 10 - 300 mg/dL   Microalb/Creat Ratio <30 <30 mg/g  Bayer DCA Hb A1c Waived  Result Value Ref Range   Bayer DCA Hb A1c Waived 6.3 <7.0 %  HIV antibody  Result Value Ref Range   HIV Screen 4th Generation wRfx Non Reactive Non Reactive  Hepatitis C antibody  Result Value Ref Range   Hep C Virus Ab <0.1 0.0 - 0.9 s/co ratio      Assessment & Plan:   Problem List  Items Addressed This Visit    None    Visit Diagnoses    Hypoglycemia    -  Primary    Will decrease her metformin to 500mg  daily and recheck glucose in 2 weeks. If still feeling this way, will stop metformin and monitor. Labs normal last visit.  Relevant Orders    Glucose Hemocue Waived    Screening for breast cancer        Mammogram ordered today.    Relevant Orders    MM DIGITAL SCREENING BILATERAL    Inclusion cyst        Reassured patient. Continue to monitor. She will let us know if getting bigger or worse.         Follow up plan: Return Before 12/11.

## 2015-03-24 ENCOUNTER — Ambulatory Visit (INDEPENDENT_AMBULATORY_CARE_PROVIDER_SITE_OTHER): Payer: 59 | Admitting: Family Medicine

## 2015-03-24 ENCOUNTER — Encounter: Payer: Self-pay | Admitting: Family Medicine

## 2015-03-24 VITALS — BP 125/84 | HR 81 | Temp 98.4°F | Ht 64.4 in | Wt 215.0 lb

## 2015-03-24 DIAGNOSIS — E162 Hypoglycemia, unspecified: Secondary | ICD-10-CM | POA: Diagnosis not present

## 2015-03-24 DIAGNOSIS — R42 Dizziness and giddiness: Secondary | ICD-10-CM

## 2015-03-24 LAB — GLUCOSE HEMOCUE WAIVED: Glu Hemocue Waived: 124 mg/dL — ABNORMAL HIGH (ref 65–99)

## 2015-03-24 MED ORDER — MECLIZINE HCL 12.5 MG PO TABS: ORAL_TABLET | ORAL | Status: DC

## 2015-03-24 NOTE — Patient Instructions (Signed)
Vertigo Vertigo means that you feel like you are moving when you are not. Vertigo can also make you feel like things around you are moving when they are not. This feeling can come and go at any time. Vertigo often goes away on its own. HOME CARE  Avoid making fast movements.  Avoid driving.  Avoid using heavy machinery.  Avoid doing any task or activity that might cause danger to you or other people if you would have a vertigo attack while you are doing it.  Sit down right away if you feel dizzy or have trouble with your balance.  Take over-the-counter and prescription medicines only as told by your doctor.  Follow instructions from your doctor about which positions or movements you should avoid.  Drink enough fluid to keep your pee (urine) clear or pale yellow.  Keep all follow-up visits as told by your doctor. This is important. GET HELP IF:  Medicine does not help your vertigo.  You have a fever.  Your problems get worse or you have new symptoms.  Your family or friends see changes in your behavior.  You feel sick to your stomach (nauseous) or you throw up (vomit).  You have a "pins and needles" feeling or you are numb in part of your body. GET HELP RIGHT AWAY IF:  You have trouble moving or talking.  You are always dizzy.  You pass out (faint).  You get very bad headaches.  You feel weak or have trouble using your hands, arms, or legs.  You have changes in your hearing.  You have changes in your seeing (vision).  You get a stiff neck.  Bright light starts to bother you.   This information is not intended to replace advice given to you by your health care provider. Make sure you discuss any questions you have with your health care provider.   Document Released: 01/10/2008 Document Revised: 12/22/2014 Document Reviewed: 07/26/2014 Elsevier Interactive Patient Education 2016 Elsevier Inc.  

## 2015-03-24 NOTE — Progress Notes (Signed)
BP 125/84 mmHg  Pulse 81  Temp(Src) 98.4 F (36.9 C)  Ht 5' 4.4" (1.636 m)  Wt 215 lb (97.523 kg)  BMI 36.44 kg/m2  SpO2 96%  LMP  (LMP Unknown)   Subjective:    Patient ID: Meghan Vasquez, female    DOB: 1960/07/15, 54 y.o.   MRN: [id number]  HPI: Meghan Vasquez is a 54 y.o. female  Chief Complaint  Patient presents with  . Hypoglycemia   DIABETES- Has been still feeling light headed and not better with eating Hypoglycemic episodes:yes Polydipsia/polyuria: no Visual disturbance: no Chest pain: no Paresthesias: yes in her legs occasionally  Glucose Monitoring: yes  Accucheck frequency: TID  Post prandial: 134-145  Before meals: 99, 97 Taking Insulin?: no Blood Pressure Monitoring: not checking  DIZZINESS Duration: daily, doesn't seem to go away with eating Description of symptoms: lightheaded, still getting hot flashes Duration of episode: minutes Dizziness frequency: 3+ times a day Provoking factors: none Aggravating factors:  none Triggered by rolling over in bed: no Triggered by bending over: no Aggravated by head movement: no Aggravated by exertion, coughing, loud noises: no Recent head injury: no Recent or current viral symptoms: no History of vasovagal episodes: no Nausea: yes Vomiting: no Tinnitus: yes Hearing loss: yes Aural fullness: no Headache: no Photophobia/phonophobia: no Unsteady gait: yes Postural instability: yes Diplopia, dysarthria, dysphagia or weakness: no Related to exertion: no Pallor: no Diaphoresis: no Dyspnea: no Chest pain: no  Relevant past medical, surgical, family and social history reviewed and updated as indicated. Interim medical history since our last visit reviewed. Allergies and medications reviewed and updated.  Review of Systems  Constitutional: Negative.   Respiratory: Negative.   Cardiovascular: Negative.   Neurological: Negative.   Psychiatric/Behavioral: Negative.    Per HPI unless specifically indicated  above     Objective:    BP 125/84 mmHg  Pulse 81  Temp(Src) 98.4 F (36.9 C)  Ht 5' 4.4" (1.636 m)  Wt 215 lb (97.523 kg)  BMI 36.44 kg/m2  SpO2 96%  LMP  (LMP Unknown)  Wt Readings from Last 3 Encounters:  03/24/15 215 lb (97.523 kg)  03/14/15 212 lb (96.163 kg)  02/14/15 213 lb 9.6 oz (96.888 kg)    Physical Exam  Constitutional: She is oriented to person, place, and time. She appears well-developed and well-nourished. No distress.  HENT:  Head: Normocephalic and atraumatic.  Right Ear: Hearing and external ear normal.  Left Ear: Hearing and external ear normal.  Nose: Nose normal.  Mouth/Throat: Oropharynx is clear and moist. No oropharyngeal exudate.  Eyes: Conjunctivae, EOM and lids are normal. Pupils are equal, round, and reactive to light. Right eye exhibits no discharge. Left eye exhibits no discharge. No scleral icterus. Right eye exhibits normal extraocular motion and no nystagmus. Left eye exhibits normal extraocular motion and no nystagmus.  Neck: Normal range of motion. Neck supple. No JVD present. No tracheal deviation present. No thyromegaly present.  Pulmonary/Chest: Effort normal. No stridor. No respiratory distress.  Musculoskeletal: Normal range of motion.  Lymphadenopathy:    She has no cervical adenopathy.  Neurological: She is alert and oriented to person, place, and time. She has normal reflexes. She displays normal reflexes. No cranial nerve deficit. She exhibits normal muscle tone. Coordination normal.  Skin: Skin is warm, dry and intact. No rash noted. She is not diaphoretic. No erythema. No pallor.  Psychiatric: She has a normal mood and affect. Her speech is normal and behavior is normal. Judgment and thought content  normal. Cognition and memory are normal.  Nursing note and vitals reviewed.   Results for orders placed or performed in visit on 03/24/15  Glucose Hemocue Waived  Result Value Ref Range   Glu Hemocue Waived 124 (H) 65 - 99 mg/dL       Assessment & Plan:   Problem List Items Addressed This Visit    None    Visit Diagnoses    Vertigo    -  Primary    Possibly the cause of her dizziness. Will start meclizine and epley's manuver's given. Call if not getting better or getting worse.    Hypoglycemia        Does not seem to be the cause of her dizziness at this time. Continue current dose of metformin and continue to monitor.     Relevant Orders    Glucose Piccolo, Waived    Glucose Hemocue Waived        Follow up plan: Return When she's back from [place].

## 2015-03-30 ENCOUNTER — Encounter: Payer: 59 | Admitting: Obstetrics and Gynecology

## 2015-05-03 ENCOUNTER — Encounter: Payer: 59 | Admitting: Obstetrics and Gynecology

## 2015-05-03 ENCOUNTER — Encounter: Payer: 59 | Admitting: Family Medicine

## 2015-05-13 ENCOUNTER — Encounter: Payer: Self-pay | Admitting: Family Medicine

## 2015-07-01 ENCOUNTER — Encounter: Payer: Self-pay | Admitting: Family Medicine

## 2015-07-01 ENCOUNTER — Other Ambulatory Visit: Payer: Self-pay | Admitting: Family Medicine

## 2015-07-01 ENCOUNTER — Ambulatory Visit (INDEPENDENT_AMBULATORY_CARE_PROVIDER_SITE_OTHER): Payer: BLUE CROSS/BLUE SHIELD | Admitting: Family Medicine

## 2015-07-01 VITALS — BP 129/83 | HR 81 | Temp 98.5°F | Ht 63.2 in | Wt 214.0 lb

## 2015-07-01 DIAGNOSIS — N183 Chronic kidney disease, stage 3 (moderate): Secondary | ICD-10-CM

## 2015-07-01 DIAGNOSIS — E1122 Type 2 diabetes mellitus with diabetic chronic kidney disease: Secondary | ICD-10-CM

## 2015-07-01 DIAGNOSIS — N760 Acute vaginitis: Secondary | ICD-10-CM

## 2015-07-01 DIAGNOSIS — Z13220 Encounter for screening for lipoid disorders: Secondary | ICD-10-CM

## 2015-07-01 DIAGNOSIS — N951 Menopausal and female climacteric states: Secondary | ICD-10-CM | POA: Diagnosis not present

## 2015-07-01 DIAGNOSIS — Z124 Encounter for screening for malignant neoplasm of cervix: Secondary | ICD-10-CM

## 2015-07-01 DIAGNOSIS — Z23 Encounter for immunization: Secondary | ICD-10-CM | POA: Diagnosis not present

## 2015-07-01 DIAGNOSIS — N898 Other specified noninflammatory disorders of vagina: Secondary | ICD-10-CM | POA: Diagnosis not present

## 2015-07-01 DIAGNOSIS — D649 Anemia, unspecified: Secondary | ICD-10-CM

## 2015-07-01 DIAGNOSIS — A499 Bacterial infection, unspecified: Secondary | ICD-10-CM | POA: Diagnosis not present

## 2015-07-01 DIAGNOSIS — Z0000 Encounter for general adult medical examination without abnormal findings: Secondary | ICD-10-CM

## 2015-07-01 DIAGNOSIS — B9689 Other specified bacterial agents as the cause of diseases classified elsewhere: Secondary | ICD-10-CM

## 2015-07-01 DIAGNOSIS — M5431 Sciatica, right side: Secondary | ICD-10-CM | POA: Diagnosis not present

## 2015-07-01 LAB — UA/M W/RFLX CULTURE, ROUTINE
BILIRUBIN UA: NEGATIVE
GLUCOSE, UA: NEGATIVE
KETONES UA: NEGATIVE
Leukocytes, UA: NEGATIVE
NITRITE UA: NEGATIVE
Protein, UA: NEGATIVE
Specific Gravity, UA: 1.02 (ref 1.005–1.030)
UUROB: 0.2 mg/dL (ref 0.2–1.0)
pH, UA: 5.5 (ref 5.0–7.5)

## 2015-07-01 LAB — MICROALBUMIN, URINE WAIVED
Creatinine, Urine Waived: 200 mg/dL (ref 10–300)
Microalb, Ur Waived: 30 mg/L — ABNORMAL HIGH (ref 0–19)

## 2015-07-01 LAB — MICROSCOPIC EXAMINATION

## 2015-07-01 LAB — HM PAP SMEAR: HM Pap smear: NEGATIVE

## 2015-07-01 LAB — WET PREP FOR TRICH, YEAST, CLUE
Clue Cell Exam: POSITIVE — AB
Trichomonas Exam: NEGATIVE
Yeast Exam: NEGATIVE

## 2015-07-01 LAB — BAYER DCA HB A1C WAIVED: HB A1C (BAYER DCA - WAIVED): 6.3 % (ref <7.0)

## 2015-07-01 MED ORDER — METRONIDAZOLE 500 MG PO TABS: 500.0000 mg | ORAL_TABLET | Freq: Two times a day (BID) | ORAL | Status: DC

## 2015-07-01 MED ORDER — FERROUS SULFATE 325 (65 FE) MG PO TBEC: 325.0000 mg | DELAYED_RELEASE_TABLET | Freq: Every day | ORAL | Status: DC

## 2015-07-01 NOTE — Assessment & Plan Note (Signed)
A1c back to 6.3 today. Continue diet and exercise. Will hold on metformin. Continue to monitor.

## 2015-07-01 NOTE — Assessment & Plan Note (Signed)
Discussed nature of perimenopause with patient today. Continue to monitor.

## 2015-07-01 NOTE — Assessment & Plan Note (Signed)
Checking labs again today. Await results.  

## 2015-07-01 NOTE — Assessment & Plan Note (Addendum)
Continue to follow with WC. Call with any concerns. Consider cymbalta. Information given today.

## 2015-07-01 NOTE — Patient Instructions (Signed)
Duloxetine delayed-release capsules  What is this medicine?  DULOXETINE (doo LOX e teen) is used to treat depression, anxiety, and different types of chronic pain.  This medicine may be used for other purposes; ask your health care provider or pharmacist if you have questions.  What should I tell my health care provider before I take this medicine?  They need to know if you have any of these conditions:  -bipolar disorder or a family history of bipolar disorder  -glaucoma  -kidney disease  -liver disease  -suicidal thoughts or a previous suicide attempt  -taken medicines called MAOIs like Carbex, Eldepryl, Marplan, Nardil, and Parnate within 14 days  -an unusual reaction to duloxetine, other medicines, foods, dyes, or preservatives  -pregnant or trying to get pregnant  -breast-feeding  How should I use this medicine?  Take this medicine by mouth with a glass of water. Follow the directions on the prescription label. Do not cut, crush or chew this medicine. You can take this medicine with or without food. Take your medicine at regular intervals. Do not take your medicine more often than directed. Do not stop taking this medicine suddenly except upon the advice of your doctor. Stopping this medicine too quickly may cause serious side effects or your condition may worsen.  A special MedGuide will be given to you by the pharmacist with each prescription and refill. Be sure to read this information carefully each time.  Talk to your pediatrician regarding the use of this medicine in children. While this drug may be prescribed for children as young as 7 years of age for selected conditions, precautions do apply.  Overdosage: If you think you have taken too much of this medicine contact a poison control center or emergency room at once.  NOTE: This medicine is only for you. Do not share this medicine with others.  What if I miss a dose?  If you miss a dose, take it as soon as you can. If it is almost time for your next  dose, take only that dose. Do not take double or extra doses.  What may interact with this medicine?  Do not take this medicine with any of the following medications:  -certain diet drugs like dexfenfluramine, fenfluramine  -desvenlafaxine  -linezolid  -MAOIs like Azilect, Carbex, Eldepryl, Marplan, Nardil, and Parnate  -methylene blue (intravenous)  -milnacipran  -thioridazine  -venlafaxine  This medicine may also interact with the following medications:  -alcohol  -aspirin and aspirin-like medicines  -certain antibiotics like ciprofloxacin and enoxacin  -certain medicines for blood pressure, heart disease, irregular heart beat  -certain medicines for depression, anxiety, or psychotic disturbances  -certain medicines for migraine headache like almotriptan, eletriptan, frovatriptan, naratriptan, rizatriptan, sumatriptan, zolmitriptan  -certain medicines that treat or prevent blood clots like warfarin, enoxaparin, and dalteparin  -cimetidine  -fentanyl  -lithium  -NSAIDS, medicines for pain and inflammation, like ibuprofen or naproxen  -phentermine  -procarbazine  -sibutramine  -St. John's wort  -theophylline  -tramadol  -tryptophan  This list may not describe all possible interactions. Give your health care provider a list of all the medicines, herbs, non-prescription drugs, or dietary supplements you use. Also tell them if you smoke, drink alcohol, or use illegal drugs. Some items may interact with your medicine.  What should I watch for while using this medicine?  Tell your doctor if your symptoms do not get better or if they get worse. Visit your doctor or health care professional for regular checks on your progress.   Because it may take several weeks to see the full effects of this medicine, it is important to continue your treatment as prescribed by your doctor.  Patients and their families should watch out for new or worsening thoughts of suicide or depression. Also watch out for sudden changes in feelings such  as feeling anxious, agitated, panicky, irritable, hostile, aggressive, impulsive, severely restless, overly excited and hyperactive, or not being able to sleep. If this happens, especially at the beginning of treatment or after a change in dose, call your health care professional.  You may get drowsy or dizzy. Do not drive, use machinery, or do anything that needs mental alertness until you know how this medicine affects you. Do not stand or sit up quickly, especially if you are an older patient. This reduces the risk of dizzy or fainting spells. Alcohol may interfere with the effect of this medicine. Avoid alcoholic drinks.  This medicine can cause an increase in blood pressure. This medicine can also cause a sudden drop in your blood pressure, which may make you feel faint and increase the chance of a fall. These effects are most common when you first start the medicine or when the dose is increased, or during use of other medicines that can cause a sudden drop in blood pressure. Check with your doctor for instructions on monitoring your blood pressure while taking this medicine.  Your mouth may get dry. Chewing sugarless gum or sucking hard candy, and drinking plenty of water may help. Contact your doctor if the problem does not go away or is severe.  What side effects may I notice from receiving this medicine?  Side effects that you should report to your doctor or health care professional as soon as possible:  -allergic reactions like skin rash, itching or hives, swelling of the face, lips, or tongue  -changes in blood pressure  -confusion  -dark urine  -dizziness  -fast talking and excited feelings or actions that are out of control  -fast, irregular heartbeat  -fever  -general ill feeling or flu-like symptoms  -hallucination, loss of contact with reality  -light-colored stools  -loss of balance or coordination  -redness, blistering, peeling or loosening of the skin, including inside the mouth  -right upper  belly pain  -seizures  -suicidal thoughts or other mood changes  -trouble concentrating  -trouble passing urine or change in the amount of urine  -unusual bleeding or bruising  -unusually weak or tired  -yellowing of the eyes or skin  Side effects that usually do not require medical attention (report to your doctor or health care professional if they continue or are bothersome):  -blurred vision  -change in appetite  -change in sex drive or performance  -headache  -increased sweating  -nausea  This list may not describe all possible side effects. Call your doctor for medical advice about side effects. You may report side effects to FDA at 1-800-FDA-1088.  Where should I keep my medicine?  Keep out of the reach of children.  Store at room temperature between 20 and 25 degrees C (68 to 77 degrees F). Throw away any unused medicine after the expiration date.  NOTE: This sheet is a summary. It may not cover all possible information. If you have questions about this medicine, talk to your doctor, pharmacist, or health care provider.     © 2016, Elsevier/[employer] Standard. (2013-03-24 16:28:32)

## 2015-07-01 NOTE — Progress Notes (Signed)
BP 129/83 mmHg  Pulse 81  Temp(Src) 98.5 F (36.9 C)  Ht 5' 3.2" (1.605 m)  Wt 214 lb (97.07 kg)  BMI 37.68 kg/m2  SpO2 98%  LMP 05/03/2015   Subjective:    Patient ID: Meghan Vasquez, female    DOB: 1960/07/10, 55 y.o.   MRN: [id number]  HPI: [name] is a 55 y.o. female presenting on 07/01/2015 for comprehensive medical examination. Current medical complaints include:  DIABETES Hypoglycemic episodes:no Polydipsia/polyuria: no Visual disturbance: no Chest pain: no Paresthesias: no Glucose Monitoring: yes  Accucheck frequency: Daily Taking Insulin?: no Blood Pressure Monitoring: not checking Retinal Examination: Not up to Date Foot Exam: Up to Date Diabetic Education: Completed Pneumovax: Given today Influenza: Up to Date Aspirin: no  Menopausal Symptoms: yes  Depression Screen done today and results listed below:  Depression screen [facility] 2/9 07/01/2015  Decreased Interest 2  Down, Depressed, Hopeless 3  PHQ - 2 Score 5  Altered sleeping 0  Tired, decreased energy 0  Change in appetite 0  Feeling bad or failure about yourself  2  Trouble concentrating 2  Moving slowly or fidgety/restless 1  Suicidal thoughts 0  PHQ-9 Score 10  Difficult doing work/chores Somewhat difficult     Past Medical History:  Past Medical History  Diagnosis Date  . Diabetes mellitus without complication ([place])   . Chronic sciatica   . Sciatica     Workers Comp    Surgical History:  Past Surgical History  Procedure Laterality Date  . Etopic pregnancy      Medications:  Current Outpatient Prescriptions on File Prior to Visit  Medication Sig  . amitriptyline (ELAVIL) 25 MG tablet Take 25 mg by mouth at bedtime.   . meclizine (ANTIVERT) 12.5 MG tablet 1-2 tabs TID PRN dizziness  . meloxicam (MOBIC) 15 MG tablet Take 1 tablet (15 mg total) by mouth daily as needed for pain.  . ONE TOUCH ULTRA TEST test strip   . ONETOUCH DELICA LANCETS [id number] MISC   . tiZANidine (ZANAFLEX) 4 MG  tablet Take 4 mg by mouth 3 (three) times daily.   . traMADol (ULTRAM) 50 MG tablet Take 100 mg by mouth every 6 (six) hours as needed for severe pain.   No current facility-administered medications on file prior to visit.    Allergies:  No Known Allergies  Social History:  Social History   Social History  . Marital Status: Single    Spouse Name: N/A  . Number of Children: N/A  . Years of Education: N/A   Occupational History  . Not on file.   Social History Main Topics  . Smoking status: Never Smoker   . Smokeless tobacco: Never Used  . Alcohol Use: No  . Drug Use: No  . Sexual Activity: Yes    Birth Control/ Protection: None   Other Topics Concern  . Not on file   Social History Narrative   History  Smoking status  . Never Smoker   Smokeless tobacco  . Never Used   History  Alcohol Use No    Family History:  Family History  Problem Relation Age of Onset  . Diabetes Mother   . Emphysema Brother   . Other Daughter     Scarcoidosis  . Aplastic anemia Daughter   . Cancer Daughter     Breast Bilateral Mastectom, kidney   . Kidney disease Daughter     Past medical history, surgical history, medications, allergies, family history and social history reviewed with  patient today and changes made to appropriate areas of the chart.   Review of Systems  Constitutional: Negative.   HENT: Negative.   Eyes: Negative.   Respiratory: Negative.   Cardiovascular: Negative.   Gastrointestinal: Negative.   Genitourinary: Negative.   Musculoskeletal: Positive for myalgias and back pain. Negative for joint pain, falls and neck pain.  Skin: Negative.   Neurological: Negative.   Endo/Heme/Allergies: Positive for environmental allergies. Negative for polydipsia. Does not bruise/bleed easily.  Psychiatric/Behavioral: Positive for depression. Negative for suicidal ideas, hallucinations, memory loss and substance abuse. The patient is not nervous/anxious and does not have  insomnia.     All other ROS negative except what is listed above and in the HPI.      Objective:    BP 129/83 mmHg  Pulse 81  Temp(Src) 98.5 F (36.9 C)  Ht 5' 3.2" (1.605 m)  Wt 214 lb (97.07 kg)  BMI 37.68 kg/m2  SpO2 98%  LMP 05/03/2015  Wt Readings from Last 3 Encounters:  07/01/15 214 lb (97.07 kg)  03/24/15 215 lb (97.523 kg)  03/14/15 212 lb (96.163 kg)    Physical Exam  Constitutional: She is oriented to person, place, and time. She appears well-developed and well-nourished. No distress.  HENT:  Head: Normocephalic and atraumatic.  Right Ear: Hearing, tympanic membrane, external ear and ear canal normal.  Left Ear: Hearing, tympanic membrane, external ear and ear canal normal.  Nose: Nose normal.  Mouth/Throat: Uvula is midline, oropharynx is clear and moist and mucous membranes are normal. No oropharyngeal exudate.  Eyes: Conjunctivae, EOM and lids are normal. Pupils are equal, round, and reactive to light. Right eye exhibits no discharge. Left eye exhibits no discharge. No scleral icterus.  Neck: Normal range of motion. Neck supple. No JVD present. No tracheal deviation present. No thyromegaly present.  Cardiovascular: Normal rate, regular rhythm, normal heart sounds and intact distal pulses.  Exam reveals no gallop and no friction rub.   No murmur heard. Pulmonary/Chest: Effort normal. No stridor. No respiratory distress. She has no decreased breath sounds. She has no wheezes. She has no rhonchi. She has no rales. She exhibits no tenderness. Right breast exhibits no inverted nipple, no mass, no nipple discharge, no skin change and no tenderness. Left breast exhibits inverted nipple. Left breast exhibits no mass, no nipple discharge, no skin change and no tenderness. Breasts are symmetrical.  Abdominal: Soft. Bowel sounds are normal. She exhibits no distension and no mass. There is no tenderness. There is no rebound and no guarding. Hernia confirmed negative in the right  inguinal area and confirmed negative in the left inguinal area.  Genitourinary: Uterus normal. No labial fusion. There is no rash, tenderness, lesion or injury on the right labia. There is no rash, tenderness, lesion or injury on the left labia. Uterus is not deviated, not enlarged, not fixed and not tender. Cervix exhibits discharge and friability. Cervix exhibits no motion tenderness. Right adnexum displays no mass, no tenderness and no fullness. Left adnexum displays no mass, no tenderness and no fullness. No erythema, tenderness or bleeding in the vagina. No foreign body around the vagina. No signs of injury around the vagina. Vaginal discharge found.    Musculoskeletal: Normal range of motion. She exhibits no edema or tenderness.  Antalgic gait with cane due to chronic sciatica  Lymphadenopathy:    She has no cervical adenopathy.       Right: No inguinal adenopathy present.       Left: No  inguinal adenopathy present.  Neurological: She is alert and oriented to person, place, and time. She has normal reflexes. She displays normal reflexes. No cranial nerve deficit. She exhibits normal muscle tone. Coordination normal.  Skin: Skin is warm, dry and intact. No rash noted. She is not diaphoretic. No erythema. No pallor.  Psychiatric: She has a normal mood and affect. Her speech is normal and behavior is normal. Judgment and thought content normal. Cognition and memory are normal.  Nursing note and vitals reviewed.   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 Continue diet and exercise. Will hold on metformin. Continue to monitor.         Nervous and Auditory   Chronic sciatica    Continue to follow with WC.  Call with any concerns. Consider cymbalta. Information given today.        Other   Perimenopause    Discussed nature of perimenopause with patient today. Continue to monitor.       Anemia    Checking labs again today. Await results.       Relevant Medications   ferrous sulfate 325 (65 FE) MG EC tablet    Other Visit Diagnoses    Routine general medical examination at a health care facility    -  Primary    Up to date on vaccines. Screening labs checked today. states that her colonoscopy is up to date. Pap done today. Mammogram ordered today. Work on diet& exercise    Screening for cholesterol level        Labs checked today, await results.     Screening for cervical cancer        Pap checked today, await results.     Need for pneumococcal vaccination        Given today.    Relevant Orders    Pneumococcal polysaccharide vaccine 23-valent greater than or equal to 2yo subcutaneous/IM (Completed)    Vaginal discharge        Wet prep checked today, + for clue cells.     Relevant Orders    WET PREP FOR [place], YEAST, CLUE    BV (bacterial vaginosis)        Will treat for BV with metronidazole. Call with any concerns.     Relevant Medications    metroNIDAZOLE (FLAGYL) 500 MG tablet        Follow up plan: Return in about 6 months (around 01/01/2016) for DM follow up.   LABORATORY TESTING:  - Pap smear: pap done  IMMUNIZATIONS:   - Tdap: Tetanus vaccination status reviewed: last tetanus booster within 10 years. - Influenza: Up to date - Pneumovax: Administered today  SCREENING: -Mammogram: Ordered, but not done  - Colonoscopy: Up to date- will bring in records   PATIENT COUNSELING:   Advised to take 1 mg of folate supplement per day if capable of pregnancy.   Sexuality: Discussed sexually transmitted diseases, partner selection, use of condoms, avoidance of unintended pregnancy  and contraceptive alternatives.   Advised to avoid cigarette smoking.  I discussed with  the patient that most people either abstain from alcohol or drink within safe  limits (<=14/week and <=4 drinks/occasion for males, <=7/weeks and <= 3 drinks/occasion for females) and that the risk for alcohol disorders and other health effects rises proportionally with the number of drinks per week and how often a drinker exceeds daily limits.  Discussed cessation/primary prevention of drug use and availability of treatment for abuse.   Diet: Encouraged to adjust caloric intake to maintain  or achieve ideal body weight, to reduce intake of dietary saturated fat and total fat, to limit sodium intake by avoiding high sodium foods and not adding table salt, and to maintain adequate dietary potassium and calcium preferably from fresh fruits, vegetables, and low-fat dairy products.    stressed the importance of regular exercise  Injury prevention: Discussed safety belts, safety helmets, smoke detector, smoking near bedding or upholstery.   Dental health: Discussed importance of regular tooth brushing, flossing, and dental visits.    NEXT PREVENTATIVE PHYSICAL DUE IN 1 YEAR. Return in about 6 months (around 01/01/2016) for DM follow up.

## 2015-07-02 LAB — CBC WITH DIFFERENTIAL/PLATELET
BASOS ABS: 0 10*3/uL (ref 0.0–0.2)
BASOS: 0 %
EOS (ABSOLUTE): 0.2 10*3/uL (ref 0.0–0.4)
Eos: 2 %
Hematocrit: 33.8 % — ABNORMAL LOW (ref 34.0–46.6)
Hemoglobin: 11.4 g/dL (ref 11.1–15.9)
IMMATURE GRANS (ABS): 0 10*3/uL (ref 0.0–0.1)
IMMATURE GRANULOCYTES: 0 %
LYMPHS: 37 %
Lymphocytes Absolute: 2.6 10*3/uL (ref 0.7–3.1)
MCH: 29.7 pg (ref 26.6–33.0)
MCHC: 33.7 g/dL (ref 31.5–35.7)
MCV: 88 fL (ref 79–97)
MONOS ABS: 0.4 10*3/uL (ref 0.1–0.9)
Monocytes: 6 %
NEUTROS PCT: 55 %
Neutrophils Absolute: 3.8 10*3/uL (ref 1.4–7.0)
PLATELETS: 270 10*3/uL (ref 150–379)
RBC: 3.84 x10E6/uL (ref 3.77–5.28)
RDW: 14.4 % (ref 12.3–15.4)
WBC: 6.9 10*3/uL (ref 3.4–10.8)

## 2015-07-02 LAB — COMPREHENSIVE METABOLIC PANEL
A/G RATIO: 1.6 (ref 1.2–2.2)
ALT: 18 IU/L (ref 0–32)
AST: 18 IU/L (ref 0–40)
Albumin: 4.2 g/dL (ref 3.5–5.5)
Alkaline Phosphatase: 66 IU/L (ref 39–117)
BUN/Creatinine Ratio: 14 (ref 9–23)
BUN: 16 mg/dL (ref 6–24)
Bilirubin Total: 0.3 mg/dL (ref 0.0–1.2)
CALCIUM: 9.6 mg/dL (ref 8.7–10.2)
CO2: 26 mmol/L (ref 18–29)
CREATININE: 1.11 mg/dL — AB (ref 0.57–1.00)
Chloride: 100 mmol/L (ref 96–106)
GFR, EST AFRICAN AMERICAN: 66 mL/min/{1.73_m2} (ref >59)
GFR, EST NON AFRICAN AMERICAN: 57 mL/min/{1.73_m2} — AB (ref >59)
GLUCOSE: 103 mg/dL — AB (ref 65–99)
Globulin, Total: 2.6 g/dL (ref 1.5–4.5)
POTASSIUM: 4.3 mmol/L (ref 3.5–5.2)
Sodium: 139 mmol/L (ref 134–144)
TOTAL PROTEIN: 6.8 g/dL (ref 6.0–8.5)

## 2015-07-02 LAB — TSH: TSH: 0.796 u[IU]/mL (ref 0.450–4.500)

## 2015-07-02 LAB — LIPID PANEL PICCOLO, WAIVED

## 2015-07-04 ENCOUNTER — Telehealth: Payer: Self-pay | Admitting: Family Medicine

## 2015-07-04 ENCOUNTER — Encounter: Payer: Self-pay | Admitting: Family Medicine

## 2015-07-04 NOTE — Telephone Encounter (Signed)
Patient called wanting to know if she can get a letter for her job stating even though she stopped taking her metformin she is doing well and doesn't need it. If you have any further questions you can call her at [phone].

## 2015-07-05 LAB — IGP, APTIMA HPV, RFX 16/18,45
HPV APTIMA: NEGATIVE
PAP Smear Comment: 0

## 2015-07-05 NOTE — Telephone Encounter (Signed)
Letter written and up front for her to pick up

## 2015-07-05 NOTE — Telephone Encounter (Signed)
Dr.Johnson, is this something that we can do?

## 2015-07-05 NOTE — Telephone Encounter (Signed)
Called and let patient know that the letter is ready to be picked up.

## 2015-07-12 ENCOUNTER — Ambulatory Visit: Payer: Self-pay | Admitting: Family Medicine

## 2015-07-12 ENCOUNTER — Ambulatory Visit
Admission: RE | Admit: 2015-07-12 | Discharge: 2015-07-12 | Disposition: A | Discharge status: Home / Self Care | Payer: BLUE CROSS/BLUE SHIELD | Source: Ambulatory Visit | Attending: Family Medicine | Admitting: Family Medicine

## 2015-07-12 DIAGNOSIS — Z1231 Encounter for screening mammogram for malignant neoplasm of breast: Secondary | ICD-10-CM | POA: Insufficient documentation

## 2015-07-12 DIAGNOSIS — Z1239 Encounter for other screening for malignant neoplasm of breast: Secondary | ICD-10-CM

## 2015-07-26 ENCOUNTER — Other Ambulatory Visit: Payer: Self-pay | Admitting: Family Medicine

## 2015-07-26 DIAGNOSIS — R928 Other abnormal and inconclusive findings on diagnostic imaging of breast: Secondary | ICD-10-CM

## 2015-07-27 ENCOUNTER — Other Ambulatory Visit: Payer: Self-pay | Admitting: Family Medicine

## 2015-07-27 DIAGNOSIS — R928 Other abnormal and inconclusive findings on diagnostic imaging of breast: Secondary | ICD-10-CM

## 2015-07-28 ENCOUNTER — Encounter: Payer: Self-pay | Admitting: Obstetrics and Gynecology

## 2015-08-05 ENCOUNTER — Ambulatory Visit
Admission: RE | Admit: 2015-08-05 | Discharge: 2015-08-05 | Disposition: A | Discharge status: Home / Self Care | Payer: BLUE CROSS/BLUE SHIELD | Source: Ambulatory Visit | Attending: Family Medicine | Admitting: Family Medicine

## 2015-08-05 ENCOUNTER — Encounter: Payer: Self-pay | Admitting: Family Medicine

## 2015-08-05 DIAGNOSIS — N6001 Solitary cyst of right breast: Secondary | ICD-10-CM | POA: Insufficient documentation

## 2015-08-05 DIAGNOSIS — R928 Other abnormal and inconclusive findings on diagnostic imaging of breast: Secondary | ICD-10-CM

## 2015-08-08 ENCOUNTER — Emergency Department
Admission: EM | Admit: 2015-08-08 | Discharge: 2015-08-08 | Disposition: A | Discharge status: Home / Self Care | Payer: BLUE CROSS/BLUE SHIELD | Attending: Emergency Medicine | Admitting: Emergency Medicine

## 2015-08-08 DIAGNOSIS — Z79899 Other long term (current) drug therapy: Secondary | ICD-10-CM | POA: Insufficient documentation

## 2015-08-08 DIAGNOSIS — M5441 Lumbago with sciatica, right side: Secondary | ICD-10-CM | POA: Insufficient documentation

## 2015-08-08 DIAGNOSIS — M5431 Sciatica, right side: Secondary | ICD-10-CM

## 2015-08-08 DIAGNOSIS — E1122 Type 2 diabetes mellitus with diabetic chronic kidney disease: Secondary | ICD-10-CM | POA: Insufficient documentation

## 2015-08-08 DIAGNOSIS — M545 Low back pain: Secondary | ICD-10-CM

## 2015-08-08 DIAGNOSIS — G8929 Other chronic pain: Secondary | ICD-10-CM | POA: Insufficient documentation

## 2015-08-08 DIAGNOSIS — N189 Chronic kidney disease, unspecified: Secondary | ICD-10-CM | POA: Insufficient documentation

## 2015-08-08 MED ORDER — KETOROLAC TROMETHAMINE 30 MG/ML IJ SOLN
30.0000 mg | Freq: Once | INTRAMUSCULAR | Status: AC
  Administered 2015-08-08: 30 mg via INTRAMUSCULAR
  Filled 2015-08-08: qty 1

## 2015-08-08 NOTE — ED Notes (Addendum)
Pt appears too sleepy to drive.  Pt sts that she took home medication prior to coming to ED including Zanaflex.  Pt instructed to call family/friends to see if someone can pick her up.

## 2015-08-08 NOTE — ED Notes (Signed)
Pt A/Ox4.  No sleepiness noted, pt talking on phone and no deficit noted.

## 2015-08-08 NOTE — Discharge Instructions (Signed)
Chronic Back Pain  When back pain lasts longer than 3 months, it is called chronic back pain.People with chronic back pain often go through certain periods that are more intense (flare-ups).  CAUSES Chronic back pain can be caused by wear and tear (degeneration) on different structures in your back. These structures include:  The bones of your spine (vertebrae) and the joints surrounding your spinal cord and nerve roots (facets).  The strong, fibrous tissues that connect your vertebrae (ligaments). Degeneration of these structures may result in pressure on your nerves. This can lead to constant pain. HOME CARE INSTRUCTIONS  Avoid bending, heavy lifting, prolonged sitting, and activities which make the problem worse.  Take brief periods of rest throughout the day to reduce your pain. Lying down or standing usually is better than sitting while you are resting.  Take over-the-counter or prescription medicines only as directed by your caregiver. SEEK IMMEDIATE MEDICAL CARE IF:   You have weakness or numbness in one of your legs or feet.  You have trouble controlling your bladder or bowels.  You have nausea, vomiting, abdominal pain, shortness of breath, or fainting.   This information is not intended to replace advice given to you by your health care provider. Make sure you discuss any questions you have with your health care provider.   Document Released: 05/10/2004 Document Revised: 06/25/2011 Document Reviewed: 09/20/2014 Elsevier Interactive Patient Education 2016 [employer]  Sciatica Sciatica is pain, weakness, numbness, or tingling along your sciatic nerve. The nerve starts in the lower back and runs down the back of each leg. Nerve damage or certain conditions pinch or put pressure on the sciatic nerve. This causes the pain, weakness, and other discomforts of sciatica. HOME CARE   Only take medicine as told by your doctor.  Apply ice to the affected area for 20 minutes. Do  this 3-4 times a day for the first 48-72 hours. Then try heat in the same way.  Exercise, stretch, or do your usual activities if these do not make your pain worse.  Go to physical therapy as told by your doctor.  Keep all doctor visits as told.  Do not wear high heels or shoes that are not supportive.  Get a firm mattress if your mattress is too soft to lessen pain and discomfort. GET HELP RIGHT AWAY IF:   You cannot control when you poop (bowel movement) or pee (urinate).  You have more weakness in your lower back, lower belly (pelvis), butt (buttocks), or legs.  You have redness or puffiness (swelling) of your back.  You have a burning feeling when you pee.  You have pain that gets worse when you lie down.  You have pain that wakes you from your sleep.  Your pain is worse than past pain.  Your pain lasts longer than 4 weeks.  You are suddenly losing weight without reason. MAKE SURE YOU:   Understand these instructions.  Will watch this condition.  Will get help right away if you are not doing well or get worse.   This information is not intended to replace advice given to you by your health care provider. Make sure you discuss any questions you have with your health care provider.   Document Released: 01/10/2008 Document Revised: 12/22/2014 Document Reviewed: 08/12/2011 Elsevier Interactive Patient Education [employer].

## 2015-08-08 NOTE — ED Provider Notes (Signed)
[facility] Emergency Department Provider Note  ____________________________________________  Time seen: Approximately 12:43 PM  I have reviewed the triage vital signs and the nursing notes.   HISTORY  Chief Complaint Back Pain    HPI Meghan Vasquez is a 55 y.o. female, NAD, presents to the emergency department with one day history of worsening lower back pain. She states that over a year ago she injured her back at work and the pain has been constant since then. Yesterday the pain began to radiate down her right leg. She states the pain is a 10/10 and she is unable to do her normal routine. She has taken mobic and zanaflex for the pain with no relief. Is treated for chronic pain by Dr Clide Deutscher in which the patient sees on a monthly basis. She denies saddle anesthesia, dysuria, or paresthesias. Denies any new injury. No new injuries, traumas, falls. No loss of bowel or bladder function.    Past Medical History  Diagnosis Date  . Diabetes mellitus without complication ([place])   . Chronic sciatica   . Sciatica     Workers Comp    Patient Active Problem List   Diagnosis Date Noted  . Anemia 02/01/2015  . 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 Perimenopause 01/31/2015  . 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 Chronic sciatica     Past Surgical History  Procedure Laterality Date  . Etopic pregnancy      Current Outpatient Rx  Name  Route  Sig  Dispense  Refill  . amitriptyline (ELAVIL) 25 MG tablet   Oral   Take 25 mg by mouth at bedtime.       0   . ferrous sulfate 325 (65 FE) MG EC tablet   Oral   Take 1 tablet (325 mg total) by mouth daily with breakfast.   30 tablet   6   . meclizine (ANTIVERT) 12.5 MG tablet      1-2 tabs TID PRN dizziness   90 tablet   0   . meloxicam (MOBIC) 15 MG tablet   Oral   Take 1 tablet (15 mg total) by mouth daily as needed for pain.   10 tablet   0   . metroNIDAZOLE (FLAGYL)  500 MG tablet   Oral   Take 1 tablet (500 mg total) by mouth 2 (two) times daily.   14 tablet   0   . ONE TOUCH ULTRA TEST test strip            1     Dispense as written.   Glory Rosebush DELICA LANCETS [id number] MISC            1   . tiZANidine (ZANAFLEX) 4 MG tablet   Oral   Take 4 mg by mouth 3 (three) times daily.       0   . traMADol (ULTRAM) 50 MG tablet   Oral   Take 100 mg by mouth every 6 (six) hours as needed for severe pain.           Allergies Review of patient's allergies indicates no known allergies.  Family History  Problem Relation Age of Onset  . Diabetes Mother   . Emphysema Brother   . Other Daughter     Scarcoidosis  . Aplastic anemia Daughter   . Kidney disease Daughter   . Cancer Daughter     Breast Bilateral Mastectom, kidney     Social History Social  History  Substance Use Topics  . Smoking status: Never Smoker   . Smokeless tobacco: Never Used  . Alcohol Use: No     Review of Systems  Constitutional: No fever/chills Eyes: No visual changes. No discharge ENT: No sore throat. Cardiovascular: No chest pain. Respiratory: No cough. No shortness of breath. No wheezing.  Gastrointestinal: No abdominal pain.  No nausea, vomiting.  No diarrhea.  No constipation. Genitourinary: Negative for dysuria. No hematuria. No urinary hesitancy, urgency or increased frequency. Musculoskeletal: Positive for back pain that radiates down the right leg. Skin: Negative for rash. Neurological: Negative for headaches, focal weakness or numbness. 10-point ROS otherwise negative.  ____________________________________________   PHYSICAL EXAM:  VITAL SIGNS: ED Triage Vitals  Enc Vitals Group     BP 08/08/15 1121 129/74 mmHg     Pulse Rate 08/08/15 1121 83     Resp 08/08/15 1121 18     Temp 08/08/15 1121 98 F (36.7 C)     Temp Source 08/08/15 1121 Oral     SpO2 08/08/15 1121 98 %     Weight 08/08/15 1121 215 lb (97.523 kg)     Height 08/08/15  1121 5\' 3"  (1.6 m)     Head Cir --      Peak Flow --      Pain Score 08/08/15 1123 6     Pain Loc --      Pain Edu? --      Excl. in [place]? --     Constitutional: Alert and oriented. Well appearing and in no acute distress. Eyes: Conjunctivae are normal. PERRLA. EOMI without pain.  Head: Atraumatic. Neck: No cervical spine tenderness to palpation. Supple with full range of motion. Hematological/Lymphatic/Immunilogical: No cervical lymphadenopathy. Cardiovascular: Regular rate and rhythm. Good S1, S2. No murmurs, rubs, gallops. Good peripheral circulation. Respiratory: Normal respiratory effort without tachypnea or retractions. Lungs CTAB with breath sounds noted in all lung fields. Musculoskeletal: Moderate low back pain that intensifies with movement and palpation diffusely. Pain > on the right as compared to left. Moderate right sided SI joint tenderness. Positive right sided straight leg raise. No lower extremity tenderness nor edema.  No joint effusions. Neurologic:  Normal speech and language. No gross focal neurologic deficits are appreciated.  Skin:  Skin is warm, dry and intact. No rash noted. Psychiatric: Mood and affect are normal. Speech and behavior are normal. Patient exhibits appropriate insight and judgement.    RADIOLOGY  None ____________________________________________    PROCEDURES  Procedure(s) performed: None   Medications  ketorolac (TORADOL) 30 MG/ML injection 30 mg (30 mg Intramuscular Given 08/08/15 1246)   ----------------------------------------- 1:19 PM on 08/08/2015 -----------------------------------------  I entered the exam room to check on the patient since her Toradol injection, patient was slightly sedated and fatigued. She has been able to recount her medical history without hesitation and showed mean medications of which she is treated for chronic pain. In her pocket she had prescriptions for tizanidine, Motrin, and amitriptyline. When asked  if she took any of these medications the patient were applied no. Patient informed that she would need someone to pick her up or stay in the ED until she was deemed safe to drive. Patient notes everyone is at work so she will need to wait.    ----------------------------------------- 2:07 PM on 08/08/2015 -----------------------------------------  I have rechecked on the patient and she is bright eyed, smiling and walking around the exam room. She is noting that she feels decreased pain since being given  Toradol and is ready for discharge. ____________________________________________   INITIAL IMPRESSION / ASSESSMENT AND PLAN / ED COURSE  Patient's diagnosis is consistent with chronic lower back pain with acute sciatica of the right side. Patient will be discharged home with instructions to follow-up with her primary care provider or Dr. Clide Deutscher for further evaluation and treatment of her chronic pain.  Patient is given ED precautions to return to the ED for any worsening or new symptoms.    ____________________________________________  FINAL CLINICAL IMPRESSION(S) / ED DIAGNOSES  Final diagnoses:  Chronic lower back pain  Sciatica of right side      NEW MEDICATIONS STARTED DURING THIS VISIT:  New Prescriptions   No medications on file         Braxton Feathers, PA-C 08/08/15 [place], MD 08/08/15 [phone]

## 2015-08-08 NOTE — ED Notes (Signed)
Chronic back pain from work related injury X 1 year ago; pt having lower back pain that radiates down bilateral legs since last night. PT takes meloxicam for pain, states it is not helping anymore. PT comes with cane, only uses cane when back is hurting. Pt alert and oriented X4, active, cooperative, pt in NAD. RR even and unlabored, color WNL.

## 2015-08-16 ENCOUNTER — Encounter: Payer: Self-pay | Admitting: Family Medicine

## 2015-08-16 ENCOUNTER — Ambulatory Visit (INDEPENDENT_AMBULATORY_CARE_PROVIDER_SITE_OTHER): Payer: BLUE CROSS/BLUE SHIELD | Admitting: Family Medicine

## 2015-08-16 VITALS — BP 115/78 | HR 86 | Temp 98.5°F | Ht 63.2 in | Wt 220.0 lb

## 2015-08-16 DIAGNOSIS — N6001 Solitary cyst of right breast: Secondary | ICD-10-CM | POA: Diagnosis not present

## 2015-08-16 MED ORDER — AMOXICILLIN 500 MG PO CAPS: 500.0000 mg | ORAL_CAPSULE | Freq: Two times a day (BID) | ORAL | Status: DC

## 2015-08-16 NOTE — Progress Notes (Signed)
BP 115/78 mmHg  Pulse 86  Temp(Src) 98.5 F (36.9 C)  Ht 5' 3.2" (1.605 m)  Wt 220 lb (99.791 kg)  BMI 38.74 kg/m2  SpO2 97%  LMP 08/12/2015 (Exact Date)   Subjective:    Patient ID: Meghan Vasquez, female    DOB: July 18, 1960, 55 y.o.   MRN: [id number]  HPI: Meghan Vasquez is a 55 y.o. female  Chief Complaint  Patient presents with  . Mass    Patient has a lump on her right upper chest, it is getting larger   BREAST PAIN/SKIN ISSUE- has been more irritated since having having ultrasound Duration : 1.5 weeks Location: right Onset: sudden Severity: mild Quality: tender Frequency: constant Redness: yes Swelling: yes Trauma: no trauma Breastfeeding: no Associated with menstral cycle: no Nipple discharge: no Breast lump: no Status: worse Treatments attempted: none Previous mammogram: yes with ultrasound 4/21  Relevant past medical, surgical, family and social history reviewed and updated as indicated. Interim medical history since our last visit reviewed. Allergies and medications reviewed and updated.  Review of Systems  Constitutional: Negative.   Respiratory: Negative.   Cardiovascular: Negative.   Skin: Negative.   Psychiatric/Behavioral: Negative.     Per HPI unless specifically indicated above     Objective:    BP 115/78 mmHg  Pulse 86  Temp(Src) 98.5 F (36.9 C)  Ht 5' 3.2" (1.605 m)  Wt 220 lb (99.791 kg)  BMI 38.74 kg/m2  SpO2 97%  LMP 08/12/2015 (Exact Date)  Wt Readings from Last 3 Encounters:  08/16/15 220 lb (99.791 kg)  08/08/15 215 lb (97.523 kg)  07/01/15 214 lb (97.07 kg)    Physical Exam  Constitutional: She is oriented to person, place, and time. She appears well-developed and well-nourished. No distress.  HENT:  Head: Normocephalic and atraumatic.  Right Ear: Hearing normal.  Left Ear: Hearing normal.  Nose: Nose normal.  Eyes: Conjunctivae and lids are normal. Right eye exhibits no discharge. Left eye exhibits no discharge. No  scleral icterus.  Pulmonary/Chest: Effort normal. No respiratory distress.  Musculoskeletal: Normal range of motion.  Neurological: She is alert and oriented to person, place, and time.  Skin: Skin is warm, dry and intact. No rash noted. No erythema. No pallor.  Cystic 2cm structure in the upper inner quadrant of her R breast. Slightly erythematous and warm. No fluctuance.   Psychiatric: She has a normal mood and affect. Her speech is normal and behavior is normal. Judgment and thought content normal. Cognition and memory are normal.  Nursing note and vitals reviewed.   Results for orders placed or performed in visit on 07/04/15  HM PAP SMEAR  Result Value Ref Range   HM Pap smear Negative with negative HPV       Assessment & Plan:   Problem List Items Addressed This Visit    None    Visit Diagnoses    Breast cyst, right    -  Primary    Will treat with amoxicillin for mild infection. If not better in 1 week, will refer to surgery for removal.         Follow up plan: Return As scheduled.

## 2015-08-16 NOTE — Patient Instructions (Signed)
Breast Cyst A breast cyst is a sac in the breast that is filled with fluid. Breast cysts are common in women. Women can have one or many cysts. When the breasts contain many cysts, it is usually due to a noncancerous (benign) condition called fibrocystic change. These lumps form under the influence of female hormones (estrogen and progesterone). The lumps are most often located in the upper, outer portion of the breast. They are often more swollen, painful, and tender before your period starts. They usually disappear after menopause, unless you are on hormone therapy.  There are several types of cysts:  Macrocyst. This is a cyst that is about 2 in. (5.1 cm) in diameter.   Microcyst. This is a tiny cyst that you cannot feel but can be seen with a mammogram or an ultrasound.   Galactocele. This is a cyst containing milk that may develop if you suddenly stop breastfeeding.   Sebaceous cyst of the skin. This type of cyst is not in the breast tissue itself. Breast cysts do not increase your risk of breast cancer. However, they must be monitored closely because they can be cancerous.  CAUSES  It is not known exactly what causes a breast cyst to form. Possible causes include:  An overgrowth of milk glands and connective tissue in the breast can block the milk glands, causing them to fill with fluid.   Scar tissue in the breast from previous surgery may block the glands, causing a cyst.  RISK FACTORS Estrogen may influence the development of a breast cyst.  SIGNS AND SYMPTOMS   Feeling a smooth, round, soft lump (like a grape) in the breast that is easily moveable.   Breast discomfort or pain.  Increase in size of the lump before your menstrual period and decrease in its size after your menstrual period.  DIAGNOSIS  A cyst can be felt during a physical exam by your health care provider. A breast X-ray exam (mammogram) and ultrasonography will be done to confirm the diagnosis. Fluid may  be removed from the cyst with a needle (fine needle aspiration) to make sure the cyst is not cancerous.  TREATMENT  Treatment may not be necessary. Your health care provider may monitor the cyst to see if it goes away on its own. If treatment is needed, it may include:  Hormone treatment.   Needle aspiration. There is a chance of the cyst coming back after aspiration.   Surgery to remove the whole cyst.  HOME CARE INSTRUCTIONS   Keep all follow-up appointments with your health care provider.  See your health care provider regularly:  Get a yearly exam by your health care provider.  Have a clinical breast exam by a health care provider every 1-3 years if you are 20-40 years of age. After age 40 years, you should have the exam every year.   Get mammogram tests as directed by your health care provider.   Understand the normal appearance and feel of your breasts and perform breast self-exams.   Only take over-the-counter or prescription medicines as directed by your health care provider.   Wear a supportive bra, especially when exercising.   Avoid caffeine.   Reduce your salt intake, especially before your menstrual period. Too much salt can cause fluid retention, breast swelling, and discomfort.  SEEK MEDICAL CARE IF:   You feel, or think you feel, a lump in your breast.   You notice that both breasts look or feel different than usual.   Your   breast is still causing pain after your menstrual period is over.   You need medicine for breast pain and swelling that occurs with your menstrual period.  SEEK IMMEDIATE MEDICAL CARE IF:   You have severe pain, tenderness, redness, or warmth in your breast.   You have nipple discharge or bleeding.   Your breast lump becomes hard and painful.   You find new lumps or bumps that were not there before.   You feel lumps in your armpit (axilla).   You notice dimpling or wrinkling of the breast or nipple.   You  have a fever.  MAKE SURE YOU:  Understand these instructions.  Will watch your condition.  Will get help right away if you are not doing well or get worse.   This information is not intended to replace advice given to you by your health care provider. Make sure you discuss any questions you have with your health care provider.   Document Released: 04/02/2005 Document Revised: 12/03/2012 Document Reviewed: 10/30/2012 Elsevier Interactive Patient Education 2016 [employer]vier Inc.  

## 2015-08-19 ENCOUNTER — Telehealth: Payer: Self-pay | Admitting: Family Medicine

## 2015-08-19 NOTE — Telephone Encounter (Signed)
Dr.Johnson, should I just advise patient to use warm compresses, and that if she experiences warmth or redness she will need to be seen?

## 2015-08-19 NOTE — Telephone Encounter (Signed)
Please have Dr. Wynetta Emery call pt ASAP, pt stated the cyst on her right breast has started to leak clear/yellowish puss. Please call pt ASAP. Thanks.

## 2015-08-19 NOTE — Telephone Encounter (Signed)
Patient notified

## 2015-08-19 NOTE — Telephone Encounter (Signed)
Yes.  Thank you.

## 2015-08-22 ENCOUNTER — Telehealth: Payer: Self-pay | Admitting: Family Medicine

## 2015-08-22 NOTE — Telephone Encounter (Signed)
Pt called would like to have the biopsy done on her right foot. Please give pt a call back concerning this. Thanks.

## 2015-08-22 NOTE — Telephone Encounter (Signed)
Patient will need an appointment to come in and discuss this, please get patient scheduled.

## 2015-08-23 NOTE — Telephone Encounter (Signed)
Called pt Meghan Vasquez stated Meghan Vasquez and Dr. Wynetta Emery discussed this at her last appt and Dr. Wynetta Emery instructed her to call back if Meghan Vasquez wanted to have the biopsy done and Dr. Wynetta Emery would complete the referral for this. Pt does not understand why Meghan Vasquez would need an appt to discuss this again. Please advise. Thanks.

## 2015-08-29 ENCOUNTER — Telehealth: Payer: Self-pay | Admitting: Family Medicine

## 2015-08-29 DIAGNOSIS — N63 Unspecified lump in unspecified breast: Secondary | ICD-10-CM

## 2015-08-29 NOTE — Telephone Encounter (Signed)
Pt called demands to be seen this week. Stated she wants a biopsy done on her breast. Her daughter has breast cancer and she is concerned. Pt stated she has called several times. Pt informed that we do not have any appointments available this week. Pt offered next available appt on 09/05/15. Pt became angry stated that if she can not get in to see Dr. Wynetta Emery this week she is going to find another provider as she will not sit at home and wait any longer for this biopsy. Pt stated Dr. Wynetta Emery informed her that if anything came back abnormal she could have a biopsy done.Pt stated she does not understand why she needs to be seen again, refuses to see any other provider. Please have Dr. Wynetta Emery call pt ASAP once she returns. Thanks.

## 2015-08-29 NOTE — Telephone Encounter (Signed)
Can I help with this?  Maybe get her referred to a breast surgeon?

## 2015-08-29 NOTE — Telephone Encounter (Signed)
Routing to Dr. Johnson

## 2015-08-30 ENCOUNTER — Encounter: Payer: Self-pay | Admitting: *Deleted

## 2015-08-30 NOTE — Telephone Encounter (Signed)
Patient notified

## 2015-08-30 NOTE — Telephone Encounter (Signed)
Patient is scheduled for Monday at 2:00 arrive at 1:30.  The ultrasound and mammogram was a category 2, and that was the soonest they could get her in without it being a category 4.  Tried calling patient to notify her. She did not answer. Left message for patient to return my call.

## 2015-08-30 NOTE — Telephone Encounter (Signed)
Her mammogram and ultrasound came back normal, and that the nodule was a cyst. To follow-up in one year for another mammogram.  I don't think she knows the results of it.  Maybe a provider could call her first with the results and reassure her?

## 2015-08-30 NOTE — Telephone Encounter (Signed)
She needs to be referred to surgery per Dr. Durenda Age last note.  I will put the order in.  Please call and let her know.  She does not need an appointment with [place]

## 2015-09-05 ENCOUNTER — Encounter: Payer: Self-pay | Admitting: General Surgery

## 2015-09-05 ENCOUNTER — Ambulatory Visit (INDEPENDENT_AMBULATORY_CARE_PROVIDER_SITE_OTHER): Payer: BLUE CROSS/BLUE SHIELD | Admitting: General Surgery

## 2015-09-05 VITALS — BP 140/80 | HR 76 | Resp 16 | Ht 63.2 in | Wt 220.0 lb

## 2015-09-05 DIAGNOSIS — N6001 Solitary cyst of right breast: Secondary | ICD-10-CM

## 2015-09-05 DIAGNOSIS — L729 Follicular cyst of the skin and subcutaneous tissue, unspecified: Secondary | ICD-10-CM

## 2015-09-05 DIAGNOSIS — N6009 Solitary cyst of unspecified breast: Secondary | ICD-10-CM | POA: Insufficient documentation

## 2015-09-05 NOTE — Patient Instructions (Signed)
Return in one week nurse 

## 2015-09-05 NOTE — Progress Notes (Signed)
Patient ID: Meghan Vasquez, female   DOB: 1960-07-23, 55 y.o.   MRN: [id number]  Chief Complaint  Patient presents with  . Other    right breast mass    HPI Meghan Vasquez is a 55 y.o. female here for evaluation of a right breast mass. Her most recent mammogram was on 07/12/15 with added views and ultrasound on 08/05/15. Patient states she felt a lump in her right breast about a year ago. The area has got bigger in the last four to five month.   There was no improvement with a recent course of oral antibiotics.  The patient's family is from [place], and she removed to [place] to be closer to her youngest daughter who had several health issues last year.  I personally reviewed the patient's history. HPI  Past Medical History  Diagnosis Date  . Diabetes mellitus without complication ([place])   . Chronic sciatica   . Sciatica     Workers Comp  . Lumbar radiculopathy     Past Surgical History  Procedure Laterality Date  . Etopic pregnancy    . Colonoscopy  2013    Family History  Problem Relation Age of Onset  . Diabetes Mother   . Emphysema Brother   . Other Daughter     Scarcoidosis  . Aplastic anemia Daughter   . Kidney disease Daughter   . Cancer Daughter     Breast Bilateral Mastectomy age 8, kidney   . Breast cancer Cousin     Social History Social History  Substance Use Topics  . Smoking status: Never Smoker   . Smokeless tobacco: Never Used  . Alcohol Use: No    No Known Allergies  Current Outpatient Prescriptions  Medication Sig Dispense Refill  . amitriptyline (ELAVIL) 25 MG tablet Take 25 mg by mouth at bedtime.   0  . ketorolac (TORADOL) 15 MG/ML injection Inject 15 mg into the vein every 30 (thirty) days.    . meloxicam (MOBIC) 7.5 MG tablet take 1 tablet by mouth twice a day if needed for pain  0  . tiZANidine (ZANAFLEX) 4 MG tablet Take 4 mg by mouth 3 (three) times daily.   0  . ferrous sulfate 325 (65 FE) MG EC tablet Take 1 tablet (325  mg total) by mouth daily with breakfast. (Patient not taking: Reported on 09/05/2015) 30 tablet 6  . meclizine (ANTIVERT) 12.5 MG tablet 1-2 tabs TID PRN dizziness (Patient not taking: Reported on 09/05/2015) 90 tablet 0   No current facility-administered medications for this visit.    Review of Systems Review of Systems  Constitutional: Negative.   Respiratory: Negative.   Cardiovascular: Negative.     Blood pressure 140/80, pulse 76, resp. rate 16, height 5' 3.2" (1.605 m), weight 220 lb (99.791 kg), last menstrual period 08/12/2015.  Physical Exam Physical Exam  Constitutional: She is oriented to person, place, and time. She appears well-developed and well-nourished.  Eyes: Conjunctivae are normal. No scleral icterus.  Neck: Neck supple.  Cardiovascular: Normal rate, regular rhythm and normal heart sounds.   Pulmonary/Chest: Effort normal and breath sounds normal. Right breast exhibits no inverted nipple, no mass, no nipple discharge, no skin change and no tenderness. Left breast exhibits no inverted nipple, no mass, no nipple discharge, no skin change and no tenderness.    Lymphadenopathy:    She has no cervical adenopathy.    She has no axillary adenopathy.  Neurological: She is alert and oriented to person, place, and  time.  Skin: Skin is warm and dry.  Psychiatric: She has a normal mood and affect.    Data Reviewed Mammograms and ultrasound dated 07/12/2015 were reviewed. Ultrasound showed a simple cyst at the 11:00 position, 5 cm from the nipple in the right breast.  Assessment    Cutaneous abscess of the right breast skin, unimproved on oral antibiotics.  Asymptomatic right breast cyst.    Plan    Excision of the abscess was reviewed and amenable to the patient. The area was cleansed with ChloraPrep followed by 10 mL of 0.5% Xylocaine with 0.25% Marcaine with 1-200,000 epinephrine. The area was excised through elliptical incision. The specimen was sent for routine  histology. The deep tissue was approximated with a single 3-0 Vicryl figure-of-eight suture take tension off the skin due to the large weight of the underlying breast. The skin was approximated with interrupted 4-0 long horizontal mattress sutures as well as simple sutures. Telfa and Tegaderm dressing applied. The procedure was well tolerated. Ice pack provided. Wound care reviewed.  The patient reported during the minimal procedure that she is leaving for [place] tomorrow. She was asked to call the office as soon as she returns for suture removal. Return in one week to see the nurse for suture removal. Patient to call upon her return from [place] to schedule the appointment.     PCP: Park Liter This has been scribed by Lesly Rubenstein LPN

## 2015-09-07 ENCOUNTER — Telehealth: Payer: Self-pay

## 2015-09-07 NOTE — Telephone Encounter (Signed)
-----   Message from Robert Bellow, MD sent at 09/07/2015  2:53 PM EDT ----- Please notify the patient that the pathology report was as expected, a ruptured skin cyst. Follow-up as previously scheduled.  ----- Message -----    From: Lab in [facility] Interface    Sent: 09/07/2015   2:41 PM      To: Robert Bellow, MD

## 2015-09-07 NOTE — Telephone Encounter (Signed)
Left message for the patient to call back for her results.

## 2015-10-06 ENCOUNTER — Ambulatory Visit (INDEPENDENT_AMBULATORY_CARE_PROVIDER_SITE_OTHER): Payer: BLUE CROSS/BLUE SHIELD | Admitting: *Deleted

## 2015-10-06 DIAGNOSIS — N6001 Solitary cyst of right breast: Secondary | ICD-10-CM

## 2015-10-06 NOTE — Progress Notes (Signed)
Patient came in today for a wound check.  The wound is clean, with no signs of infection noted. .Follow up as scheduled.  

## 2015-10-06 NOTE — Patient Instructions (Addendum)
The patient is aware to call back for any questions or concerns.  

## 2015-12-05 ENCOUNTER — Ambulatory Visit: Payer: BLUE CROSS/BLUE SHIELD | Admitting: Family Medicine

## 2015-12-14 ENCOUNTER — Encounter: Payer: Self-pay | Admitting: Family Medicine

## 2015-12-14 ENCOUNTER — Ambulatory Visit (INDEPENDENT_AMBULATORY_CARE_PROVIDER_SITE_OTHER): Payer: BLUE CROSS/BLUE SHIELD | Admitting: Family Medicine

## 2015-12-14 VITALS — BP 133/82 | HR 73 | Temp 99.0°F | Wt 216.0 lb

## 2015-12-14 DIAGNOSIS — R42 Dizziness and giddiness: Secondary | ICD-10-CM | POA: Diagnosis not present

## 2015-12-14 DIAGNOSIS — N183 Chronic kidney disease, stage 3 (moderate): Secondary | ICD-10-CM | POA: Diagnosis not present

## 2015-12-14 DIAGNOSIS — D649 Anemia, unspecified: Secondary | ICD-10-CM

## 2015-12-14 DIAGNOSIS — Z23 Encounter for immunization: Secondary | ICD-10-CM | POA: Diagnosis not present

## 2015-12-14 DIAGNOSIS — E1122 Type 2 diabetes mellitus with diabetic chronic kidney disease: Secondary | ICD-10-CM

## 2015-12-14 LAB — UA/M W/RFLX CULTURE, ROUTINE
Bilirubin, UA: NEGATIVE
Glucose, UA: NEGATIVE
Ketones, UA: NEGATIVE
NITRITE UA: NEGATIVE
PH UA: 5.5 (ref 5.0–7.5)
Protein, UA: NEGATIVE
RBC, UA: NEGATIVE
Specific Gravity, UA: 1.015 (ref 1.005–1.030)
Urobilinogen, Ur: 0.2 mg/dL (ref 0.2–1.0)

## 2015-12-14 LAB — CBC WITH DIFFERENTIAL/PLATELET
HEMOGLOBIN: 11.8 g/dL (ref 11.1–15.9)
Hematocrit: 35 % (ref 34.0–46.6)
Lymphocytes Absolute: 3.5 10*3/uL — ABNORMAL HIGH (ref 0.7–3.1)
Lymphs: 40 %
MCH: 30.7 pg (ref 26.6–33.0)
MCHC: 33.7 g/dL (ref 31.5–35.7)
MCV: 91 fL (ref 79–97)
MID (Absolute): 0.7 10*3/uL (ref 0.1–1.6)
MID: 8 %
NEUTROS ABS: 4.6 10*3/uL (ref 1.4–7.0)
NEUTROS PCT: 53 %
PLATELETS: 249 10*3/uL (ref 150–379)
RBC: 3.84 x10E6/uL (ref 3.77–5.28)
RDW: 14.1 % (ref 12.3–15.4)
WBC: 8.8 10*3/uL (ref 3.4–10.8)

## 2015-12-14 LAB — MICROSCOPIC EXAMINATION: RBC MICROSCOPIC, UA: NONE SEEN /HPF (ref 0–?)

## 2015-12-14 LAB — BAYER DCA HB A1C WAIVED: HB A1C: 6.6 % (ref <7.0)

## 2015-12-14 LAB — HM DIABETES EYE EXAM

## 2015-12-14 MED ORDER — MECLIZINE HCL 12.5 MG PO TABS: ORAL_TABLET | ORAL | 0 refills | Status: DC

## 2015-12-14 NOTE — Assessment & Plan Note (Signed)
CBC WNL today, continue multivitamin with iron. Will recheck in 6 months to 1 year

## 2015-12-14 NOTE — Assessment & Plan Note (Signed)
A1C slightly up from last visit, now 6.6. Discussed improving diet and exercise habits to avoid adding medicines. Patient agreeable and understanding of plan.

## 2015-12-14 NOTE — Progress Notes (Signed)
BP 133/82   Pulse 73   Temp 99 F (37.2 C)   Wt 216 lb (98 kg)   LMP 11/15/2015 (Approximate)   SpO2 100%   BMI 38.02 kg/m    Subjective:    Patient ID: Meghan Vasquez, female    DOB: 02-22-1961, 55 y.o.   MRN: [id number]  HPI: Meghan Vasquez is a 55 y.o. female  Chief Complaint  Patient presents with  . Headache    about a week, with dizziness and nausea. She has not tried anything for it. Only uses cold compresses and rests. Ate alot of junk food and wonders if that caused it.    Patient presents with headache and dizziness for about a week. Has been checking BP and BS daily and they have remained stable and WNL. HAs intermittently for 1 week, seems to clear up after eating a big meal or using cold compresses. Ate a fair amount of junk food the past few weeks while a friend was in town, feels like that may be causing sxs. Hx of vertigo but states this doesn't feel exactly the same. Also thinking that her eyeglass rx is out of date as these symptoms always come on when she is wearing them. Going to the eye doctor this afternoon to get checked out and hopefully get new glasses.  Also wanting to get her flu shot and labs while she is here. Due for A1C and wanting to recheck blood counts to see if she needs to get back on additional iron supplement (multivitamin has iron in it).   Relevant past medical, surgical, family and social history reviewed and updated as indicated. Interim medical history since our last visit reviewed. Allergies and medications reviewed and updated.  Review of Systems  Constitutional: Negative.  Negative for chills and fever.  Eyes: Positive for photophobia.  Respiratory: Negative.   Cardiovascular: Negative.   Gastrointestinal: Negative.   Musculoskeletal: Negative.   Neurological: Positive for dizziness and headaches.  Psychiatric/Behavioral: Negative.     Per HPI unless specifically indicated above     Objective:    BP 133/82   Pulse 73   Temp  99 F (37.2 C)   Wt 216 lb (98 kg)   LMP 11/15/2015 (Approximate)   SpO2 100%   BMI 38.02 kg/m   Wt Readings from Last 3 Encounters:  12/14/15 216 lb (98 kg)  09/05/15 220 lb (99.8 kg)  08/16/15 220 lb (99.8 kg)    Physical Exam  Constitutional: She is oriented to person, place, and time. She appears well-developed and well-nourished. No distress.  HENT:  Head: Atraumatic.  Eyes: Conjunctivae are normal. Pupils are equal, round, and reactive to light. No scleral icterus.  Neck: Normal range of motion. Neck supple.  Cardiovascular: Normal rate and normal heart sounds.   Pulmonary/Chest: Effort normal. No respiratory distress.  Musculoskeletal: Normal range of motion.  Lymphadenopathy:    She has no cervical adenopathy.  Neurological: She is alert and oriented to person, place, and time.  Skin: Skin is warm and dry.  Psychiatric: She has a normal mood and affect. Her behavior is normal.  Nursing note and vitals reviewed.     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 Discussed improving diet and exercise habits to avoid adding medicines. Patient agreeable and understanding of  plan.       Relevant Orders   Bayer DCA Hb A1c Waived   UA/M w/rflx Culture, Routine     Other   Anemia    CBC WNL today, continue multivitamin with iron. Will recheck in 6 months to 1 year      Relevant Orders   CBC With Differential/Platelet    Other Visit Diagnoses    Encounter for immunization       Relevant Orders   Flu Vaccine QUAD 36+ mos IM (Completed)   Need for immunization against influenza       Dizziness       Meclizine refill sent in case some vertigo component. Patient will follow up if new glasses do not resolve symptoms in the next week or so. Tylenol for headache       Follow up plan: Return if symptoms worsen or fail  to improve.

## 2015-12-14 NOTE — Patient Instructions (Signed)
Follow up as needed

## 2015-12-16 ENCOUNTER — Ambulatory Visit: Payer: BLUE CROSS/BLUE SHIELD | Admitting: Family Medicine

## 2015-12-20 ENCOUNTER — Ambulatory Visit: Payer: BLUE CROSS/BLUE SHIELD | Admitting: Family Medicine

## 2016-01-02 ENCOUNTER — Ambulatory Visit: Payer: BLUE CROSS/BLUE SHIELD | Admitting: Family Medicine

## 2016-05-14 ENCOUNTER — Telehealth: Payer: Self-pay | Admitting: Family Medicine

## 2016-05-14 DIAGNOSIS — N971 Female infertility of tubal origin: Secondary | ICD-10-CM

## 2016-05-15 NOTE — Telephone Encounter (Signed)
Happy to put it in- can we find out why she wants to go?

## 2016-05-15 NOTE — Telephone Encounter (Signed)
Routing to provider  

## 2016-05-15 NOTE — Telephone Encounter (Signed)
She wants to go to GYN because she's had some pelvic pain and has been trying to get pregnant for the last 2 years with no success.

## 2016-05-15 NOTE — Telephone Encounter (Signed)
Referral generated

## 2016-05-17 ENCOUNTER — Telehealth: Payer: Self-pay | Admitting: Family Medicine

## 2016-05-17 NOTE — Telephone Encounter (Signed)
Mickel Baas from [facility] needs to speak with you regarding a referral on patient.  Thank You Santiago Glad

## 2016-05-18 NOTE — Telephone Encounter (Signed)
Tried calling Mickel Baas back from [facility].  Was on hold 5+ minutes.  Will try again later.

## 2016-05-22 ENCOUNTER — Telehealth: Payer: Self-pay | Admitting: Family Medicine

## 2016-05-22 NOTE — Telephone Encounter (Signed)
Meghan Vasquez from [facility] called and stated that due to the pts age she would need to see a Reproductive endocrinologist and they suggested [facility] to see Dr Kerin Perna phone number [phone] fax number [phone] .

## 2016-05-23 NOTE — Telephone Encounter (Signed)
Noted in referral. Will send today.

## 2016-05-23 NOTE — Telephone Encounter (Signed)
See telephone note from 05/22/2016

## 2016-05-29 ENCOUNTER — Other Ambulatory Visit: Payer: Self-pay | Admitting: Family Medicine

## 2016-05-29 ENCOUNTER — Encounter: Payer: Self-pay | Admitting: Family Medicine

## 2016-05-29 NOTE — Telephone Encounter (Signed)
Order form filled out. Will get provider to approve and sign, then fax to pharmacy.

## 2016-05-29 NOTE — Telephone Encounter (Signed)
Order form faxed to pharmacy.

## 2016-05-29 NOTE — Telephone Encounter (Signed)
Pt would like refill for one touch ultra test strips sent to [facility].

## 2016-06-19 ENCOUNTER — Ambulatory Visit (INDEPENDENT_AMBULATORY_CARE_PROVIDER_SITE_OTHER): Payer: BLUE CROSS/BLUE SHIELD | Admitting: Unknown Physician Specialty

## 2016-06-19 ENCOUNTER — Encounter: Payer: Self-pay | Admitting: Unknown Physician Specialty

## 2016-06-19 VITALS — BP 133/83 | HR 91 | Temp 99.2°F | Wt 224.4 lb

## 2016-06-19 DIAGNOSIS — Z01419 Encounter for gynecological examination (general) (routine) without abnormal findings: Secondary | ICD-10-CM

## 2016-06-19 DIAGNOSIS — N912 Amenorrhea, unspecified: Secondary | ICD-10-CM

## 2016-06-19 DIAGNOSIS — Z3202 Encounter for pregnancy test, result negative: Secondary | ICD-10-CM

## 2016-06-19 LAB — PREGNANCY, URINE: PREG TEST UR: NEGATIVE

## 2016-06-19 NOTE — Patient Instructions (Signed)
[facility] to see Dr Kerin Perna phone number [phone].

## 2016-06-19 NOTE — Progress Notes (Signed)
Dictation #1 [id number]  [id number]   BP 133/83 (BP Location: Left Arm, Patient Position: Sitting, Cuff Size: Large)   Pulse 91   Temp 99.2 F (37.3 C)   Wt 224 lb 6.4 oz (101.8 kg)   LMP 04/16/2016 (Approximate)   SpO2 95%   BMI 39.50 kg/m    Subjective:    Patient ID: Meghan Vasquez, female    DOB: Feb 22, 1961, 56 y.o.   MRN: [id number]  HPI: Meghan Vasquez is a 56 y.o. female  Chief Complaint  Patient presents with  . Possible Pregnancy    pt states that she would like to be tested to see if she is pregnant. States she has been having cravings    Amenorrhea Patient presents to clinic for a pregnancy test following 2 missed periods as she has been trying to conceive for the last two years and states that she "has been having cravings". She has had extensive conversations with her PCP, Dr. Wynetta Emery, about her low chance of conceiving due to her age and perimenopausal hormone levels. Pt still wants to conceive and  has been referred to a reproductive endocrinologist per her request on 05/23/2016, but states she was not given their information. LMP 04/16/2016-04/21/2016. Last sexual intercourse was end of January. Denies abdominal pain, vaginal bleeding, fever/chills.  She has three children and six grandchildren.  Relevant past medical, surgical, family and social history reviewed and updated as indicated. Interim medical history since our last visit reviewed. Allergies and medications reviewed and updated.  Review of Systems  Per HPI unless specifically indicated above     Objective:    BP 133/83 (BP Location: Left Arm, Patient Position: Sitting, Cuff Size: Large)   Pulse 91   Temp 99.2 F (37.3 C)   Wt 224 lb 6.4 oz (101.8 kg)   LMP 04/16/2016 (Approximate)   SpO2 95%   BMI 39.50 kg/m   Wt Readings from Last 3 Encounters:  06/19/16 224 lb 6.4 oz (101.8 kg)  12/14/15 216 lb (98 kg)  09/05/15 220 lb (99.8 kg)    Physical Exam  Constitutional: She is oriented to  person, place, and time. She appears well-developed and well-nourished. No distress.  HENT:  Head: Normocephalic and atraumatic.  Eyes: Conjunctivae are normal. Right eye exhibits no discharge. Left eye exhibits no discharge. No scleral icterus.  Neck: Normal range of motion. Neck supple. No JVD present.  Cardiovascular: Normal rate, regular rhythm, normal heart sounds and intact distal pulses.   Pulmonary/Chest: Effort normal and breath sounds normal. No respiratory distress. She has no wheezes. She has no rales.  Abdominal: Soft. Bowel sounds are normal.  Musculoskeletal: Normal range of motion.  Neurological: She is alert and oriented to person, place, and time.  Skin: Skin is warm and dry.  Psychiatric: She has a normal mood and affect. Her behavior is normal. Judgment and thought content normal.      Assessment & Plan:   Problem List Items Addressed This Visit    None    Visit Diagnoses    Amenorrhea    -  Primary   Urine pregnancy test in clinic is negative.    Relevant Orders   Pregnancy, urine   Encounter for pregnancy test with result negative       Discussed with patient that she has extremely low chances of conceiving and she expressed understanding but insisted on reproductive endocrinologist referral   Encounter for gynecological examination without abnormal finding       Relevant Orders  Ambulatory referral to Gynecology      She would like to have a referral to gyn for routine gyn care.  That was placed.    Follow up plan: Return in about 4 weeks (around 07/17/2016) for annual physical exam with Dr. Wynetta Emery..  I personally evaluated the patient, supervised plan of care, ordered the diagnostic testing, evaluated results, and ordered appropriate treatments.    \

## 2016-07-02 ENCOUNTER — Encounter: Payer: Self-pay | Admitting: Family Medicine

## 2016-09-27 ENCOUNTER — Telehealth: Payer: Self-pay | Admitting: Family Medicine

## 2016-09-27 ENCOUNTER — Ambulatory Visit: Payer: Self-pay | Admitting: Family Medicine

## 2016-09-27 NOTE — Telephone Encounter (Signed)
Pt called and stated that her BP was 139/78 and she was having dizzy spells. Pt scheduled appt for in the morning.

## 2016-09-28 ENCOUNTER — Ambulatory Visit (INDEPENDENT_AMBULATORY_CARE_PROVIDER_SITE_OTHER): Payer: BLUE CROSS/BLUE SHIELD | Admitting: Family Medicine

## 2016-09-28 ENCOUNTER — Encounter: Payer: Self-pay | Admitting: Family Medicine

## 2016-09-28 ENCOUNTER — Telehealth: Payer: Self-pay | Admitting: Family Medicine

## 2016-09-28 VITALS — BP 135/87 | HR 73 | Temp 98.7°F | Wt 224.0 lb

## 2016-09-28 DIAGNOSIS — R42 Dizziness and giddiness: Secondary | ICD-10-CM | POA: Diagnosis not present

## 2016-09-28 MED ORDER — PROMETHAZINE HCL 25 MG PO TABS: 25.0000 mg | ORAL_TABLET | Freq: Three times a day (TID) | ORAL | 0 refills | Status: DC | PRN

## 2016-09-28 MED ORDER — MECLIZINE HCL 25 MG PO TABS: 25.0000 mg | ORAL_TABLET | Freq: Three times a day (TID) | ORAL | 0 refills | Status: DC | PRN

## 2016-09-28 MED ORDER — ONDANSETRON 4 MG PO TBDP: 4.0000 mg | ORAL_TABLET | Freq: Three times a day (TID) | ORAL | 0 refills | Status: DC | PRN

## 2016-09-28 NOTE — Telephone Encounter (Signed)
Patient would like to see if Meghan Vasquez could prescribe another medication with same effect as the one she prescribed today. She said it was over 200.00  Thanks  [phone]

## 2016-09-28 NOTE — Patient Instructions (Addendum)

## 2016-09-28 NOTE — Telephone Encounter (Signed)
Sent in phenergan instead

## 2016-09-28 NOTE — Progress Notes (Signed)
   BP 135/87   Pulse 73   Temp 98.7 F (37.1 C)   Wt 224 lb (101.6 kg)   LMP 08/20/2016 (Exact Date)   SpO2 96%   BMI 39.43 kg/m    Subjective:    Patient ID: Meghan Vasquez, female    DOB: 1960/04/17, 56 y.o.   MRN: [id number]  HPI: Meghan Vasquez is a 56 y.o. female  Chief Complaint  Patient presents with  . Dizziness    yesterday afternoon got really dizzy, couldn't drive home, every time she would lift her head up, everything was spinning. It's not as bad today.   Patient presents with 1 day hx of dizziness, nausea, and sweats. States the room started spinning as she was walking out of the bank yesterday. Checked BP and BS when she got home - BP was WNL, BS was 98. States every time she moves her head the episodes worsen. Has rested most of the day and feels slightly better but still having the dizziness. Denies CP, HA, visual disturbance, confusion, extremity weakness.   Relevant past medical, surgical, family and social history reviewed and updated as indicated. Interim medical history since our last visit reviewed. Allergies and medications reviewed and updated.  Review of Systems  Constitutional: Negative.   Respiratory: Negative.   Cardiovascular: Negative.   Gastrointestinal: Negative.   Genitourinary: Negative.   Musculoskeletal: Negative.   Neurological: Positive for dizziness.  Psychiatric/Behavioral: Negative.     Per HPI unless specifically indicated above     Objective:    BP 135/87   Pulse 73   Temp 98.7 F (37.1 C)   Wt 224 lb (101.6 kg)   LMP 08/20/2016 (Exact Date)   SpO2 96%   BMI 39.43 kg/m   Wt Readings from Last 3 Encounters:  09/28/16 224 lb (101.6 kg)  06/19/16 224 lb 6.4 oz (101.8 kg)  12/14/15 216 lb (98 kg)    Physical Exam  Constitutional: She is oriented to person, place, and time. She appears well-developed and well-nourished. No distress.  HENT:  Head: Atraumatic.  Right Ear: External ear normal.  Left Ear: External ear  normal.  Eyes: Conjunctivae are normal. Pupils are equal, round, and reactive to light.  Neck: Normal range of motion. Neck supple.  Cardiovascular: Normal rate, regular rhythm and normal heart sounds.   Pulmonary/Chest: Effort normal and breath sounds normal.  Musculoskeletal: Normal range of motion.  Neurological: She is alert and oriented to person, place, and time. No cranial nerve deficit.  Skin: Skin is warm and dry.  Psychiatric: She has a normal mood and affect. Her behavior is normal.  Nursing note and vitals reviewed.     Assessment & Plan:   Problem List Items Addressed This Visit    None    Visit Diagnoses    Vertigo    -  Primary   Sxs consistent with vertigo, meclinzine sent and instructions for epley maneuver given. F/u if worsening or no improvement.     Offered pt mutliple options with some more aggressive and pt opting to try medicine and th maneuvers before doing any further testing at this time. Will keep [place] posted on sxs.    Follow up plan: Return if symptoms worsen or fail to improve.

## 2016-09-28 NOTE — Telephone Encounter (Signed)
Routing to provider  

## 2016-09-28 NOTE — Telephone Encounter (Signed)
Spoke with pharmacy, they said the Zofran is too expensive. They recommend changing it to Phenergan since she doesn't have prescription coverage.

## 2016-10-07 ENCOUNTER — Emergency Department
Admission: EM | Admit: 2016-10-07 | Discharge: 2016-10-07 | Disposition: A | Discharge status: Home / Self Care | Payer: BLUE CROSS/BLUE SHIELD | Attending: Emergency Medicine | Admitting: Emergency Medicine

## 2016-10-07 ENCOUNTER — Encounter: Payer: Self-pay | Admitting: Emergency Medicine

## 2016-10-07 DIAGNOSIS — Z79899 Other long term (current) drug therapy: Secondary | ICD-10-CM | POA: Insufficient documentation

## 2016-10-07 DIAGNOSIS — Y9389 Activity, other specified: Secondary | ICD-10-CM | POA: Insufficient documentation

## 2016-10-07 DIAGNOSIS — S199XXA Unspecified injury of neck, initial encounter: Secondary | ICD-10-CM | POA: Diagnosis present

## 2016-10-07 DIAGNOSIS — N189 Chronic kidney disease, unspecified: Secondary | ICD-10-CM | POA: Diagnosis not present

## 2016-10-07 DIAGNOSIS — Y92410 Unspecified street and highway as the place of occurrence of the external cause: Secondary | ICD-10-CM | POA: Diagnosis not present

## 2016-10-07 DIAGNOSIS — E1122 Type 2 diabetes mellitus with diabetic chronic kidney disease: Secondary | ICD-10-CM | POA: Insufficient documentation

## 2016-10-07 DIAGNOSIS — Y999 Unspecified external cause status: Secondary | ICD-10-CM | POA: Diagnosis not present

## 2016-10-07 DIAGNOSIS — S161XXA Strain of muscle, fascia and tendon at neck level, initial encounter: Secondary | ICD-10-CM | POA: Diagnosis not present

## 2016-10-07 MED ORDER — CYCLOBENZAPRINE HCL 5 MG PO TABS: 5.0000 mg | ORAL_TABLET | Freq: Three times a day (TID) | ORAL | 0 refills | Status: DC | PRN

## 2016-10-07 MED ORDER — KETOROLAC TROMETHAMINE 30 MG/ML IJ SOLN
30.0000 mg | Freq: Once | INTRAMUSCULAR | Status: AC
  Administered 2016-10-07: 30 mg via INTRAMUSCULAR
  Filled 2016-10-07: qty 1

## 2016-10-07 MED ORDER — KETOROLAC TROMETHAMINE 10 MG PO TABS: 10.0000 mg | ORAL_TABLET | Freq: Four times a day (QID) | ORAL | 0 refills | Status: AC | PRN

## 2016-10-07 MED ORDER — CYCLOBENZAPRINE HCL 10 MG PO TABS
5.0000 mg | ORAL_TABLET | Freq: Once | ORAL | Status: AC
  Administered 2016-10-07: 5 mg via ORAL

## 2016-10-07 NOTE — ED Triage Notes (Signed)
Pt in [facility]va yesterday and today  Having neck, back and left knee pain. Pt ambulatory to triage with no difficulty.
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
## 2016-12-11 ENCOUNTER — Other Ambulatory Visit: Payer: Self-pay | Admitting: Obstetrics and Gynecology

## 2016-12-11 DIAGNOSIS — R102 Pelvic and perineal pain: Secondary | ICD-10-CM

## 2017-01-09 ENCOUNTER — Ambulatory Visit
Admission: RE | Admit: 2017-01-09 | Discharge: 2017-01-09 | Disposition: A | Discharge status: Home / Self Care | Payer: Medicare HMO | Source: Ambulatory Visit | Attending: Obstetrics and Gynecology | Admitting: Obstetrics and Gynecology

## 2017-01-09 ENCOUNTER — Encounter: Payer: Self-pay | Admitting: Obstetrics and Gynecology

## 2017-01-09 DIAGNOSIS — R938 Abnormal findings on diagnostic imaging of other specified body structures: Secondary | ICD-10-CM | POA: Diagnosis not present

## 2017-01-09 DIAGNOSIS — R102 Pelvic and perineal pain: Secondary | ICD-10-CM | POA: Diagnosis not present

## 2017-01-09 MED ORDER — IOPAMIDOL (ISOVUE-370) INJECTION 76%
25.0000 mL | Freq: Once | INTRAVENOUS | Status: AC | PRN
  Administered 2017-01-09: 25 mL

## 2017-01-09 NOTE — Progress Notes (Signed)
@  PATIENTID@[id number]   [id number] y.o. 01/09/17 Benjaman Kindler, MD  Hysterosalpingogram Procedure Note  Date of procedure: 01/09/2017   Pre-operative Diagnosis: Chronic pelvic pain, hx of hydrosalpingeosis   Post-operative Diagnosis: same, left tube normal, right tube blocked with apparent tissue intravasation at the cornual portion of the tube.   Procedure: Hysterosalpingogram  Surgeon: Angelina Pih, MD  Assistant(s):  Radiology assistant. The radiologist present for today read the imaging and agreed with findings below.  Anesthesia: None  Estimated Blood Loss:  None         Complications:  None; patient tolerated the procedure well.         Disposition: To home         Condition: stable  Findings: Left sided fill and spill of the tube and a normal endometrial contour was noted. Right sided block with tissue intravasation   Procedure Details  HSG procedure discussed with the patient.  Risks, complications, alternatives have been reviewed with her and she agrees to proceed.   The patient presented to the radiology lab and was identified as the correct patient and the procedure verified as an HSG. A verbal Time Out was held with all team members present and in agreement.  Speculum was inserted in to the vagina and the cervix visualized.  The cervix was cleaned with betadine solution. The HSG catheter was inserted and the balloon insufflated with approximately 1.5 ml of air.  Patient was then repositioned for fluoroscopy.  A total of 15 ml of contrast was used for the procedure. The patient tolerated the procedure well, no complications.   Results were reviewed with the patient at the time of the procedure. She verbalized understanding.  Referral to Dr. Sharlet Salina for physiatry for addressing bilateral sciatica, as pain from cramping today is not the pain that is bothering her.   Benjaman Kindler, MD 01/09/2017

## 2017-02-06 ENCOUNTER — Other Ambulatory Visit: Payer: Self-pay | Admitting: Family Medicine

## 2017-02-06 DIAGNOSIS — Z1231 Encounter for screening mammogram for malignant neoplasm of breast: Secondary | ICD-10-CM

## 2017-02-11 ENCOUNTER — Ambulatory Visit: Payer: Worker's Compensation

## 2017-02-13 ENCOUNTER — Ambulatory Visit: Payer: Worker's Compensation

## 2017-02-14 ENCOUNTER — Ambulatory Visit: Payer: Worker's Compensation

## 2017-02-18 ENCOUNTER — Ambulatory Visit: Payer: Worker's Compensation

## 2017-02-20 ENCOUNTER — Ambulatory Visit: Payer: Worker's Compensation | Admitting: Physical Therapy

## 2017-02-26 ENCOUNTER — Ambulatory Visit: Payer: Worker's Compensation | Admitting: Physical Therapy

## 2017-02-28 ENCOUNTER — Ambulatory Visit: Payer: Worker's Compensation | Admitting: Physical Therapy

## 2017-03-02 ENCOUNTER — Encounter: Payer: Self-pay | Admitting: Emergency Medicine

## 2017-03-02 ENCOUNTER — Other Ambulatory Visit: Payer: Self-pay

## 2017-03-02 ENCOUNTER — Emergency Department
Admission: EM | Admit: 2017-03-02 | Discharge: 2017-03-02 | Disposition: A | Discharge status: Home / Self Care | Payer: Worker's Compensation | Attending: Emergency Medicine | Admitting: Emergency Medicine

## 2017-03-02 DIAGNOSIS — G8929 Other chronic pain: Secondary | ICD-10-CM | POA: Insufficient documentation

## 2017-03-02 DIAGNOSIS — Z79899 Other long term (current) drug therapy: Secondary | ICD-10-CM | POA: Insufficient documentation

## 2017-03-02 DIAGNOSIS — N189 Chronic kidney disease, unspecified: Secondary | ICD-10-CM | POA: Insufficient documentation

## 2017-03-02 DIAGNOSIS — D649 Anemia, unspecified: Secondary | ICD-10-CM | POA: Insufficient documentation

## 2017-03-02 DIAGNOSIS — M545 Low back pain: Secondary | ICD-10-CM | POA: Diagnosis present

## 2017-03-02 DIAGNOSIS — E1122 Type 2 diabetes mellitus with diabetic chronic kidney disease: Secondary | ICD-10-CM | POA: Diagnosis not present

## 2017-03-02 DIAGNOSIS — M5441 Lumbago with sciatica, right side: Secondary | ICD-10-CM | POA: Diagnosis not present

## 2017-03-02 MED ORDER — HYDROMORPHONE HCL 1 MG/ML IJ SOLN
1.0000 mg | Freq: Once | INTRAMUSCULAR | Status: AC
  Administered 2017-03-02: 1 mg via INTRAMUSCULAR
  Filled 2017-03-02: qty 1

## 2017-03-02 MED ORDER — TRAMADOL HCL 50 MG PO TABS: 50.0000 mg | ORAL_TABLET | Freq: Four times a day (QID) | ORAL | 0 refills | Status: DC | PRN

## 2017-03-02 MED ORDER — CYCLOBENZAPRINE HCL 10 MG PO TABS: 10.0000 mg | ORAL_TABLET | Freq: Three times a day (TID) | ORAL | 0 refills | Status: DC | PRN

## 2017-03-02 MED ORDER — ORPHENADRINE CITRATE 30 MG/ML IJ SOLN
60.0000 mg | Freq: Two times a day (BID) | INTRAMUSCULAR | Status: DC
  Administered 2017-03-02: 60 mg via INTRAMUSCULAR
  Filled 2017-03-02: qty 2

## 2017-03-02 MED ORDER — MELOXICAM 7.5 MG PO TABS: 7.5000 mg | ORAL_TABLET | Freq: Every day | ORAL | 0 refills | Status: DC

## 2017-03-02 NOTE — ED Triage Notes (Signed)
Low back pain x 1 week. History of injury in July at work. No new injury since.

## 2017-03-02 NOTE — Discharge Instructions (Signed)
Advised to follow-up with pain management clinic for definitive treatment.

## 2017-03-02 NOTE — ED Notes (Signed)
Epad not working at time of discharge; pt expresses verbal understanding of paper work with no further questions at this time.

## 2017-03-02 NOTE — ED Provider Notes (Signed)
[facility] Emergency Department Provider Note   ____________________________________________   First MD Initiated Contact with Patient 03/02/17 1321     (approximate)  I have reviewed the triage vital signs and the nursing notes.   HISTORY  Chief Complaint Back Pain    HPI Genesis Novosad is a 56 y.o. female patient complaining of radicular low back pain for 1 week. Patient has a history of chronic back pain secondary [place]. Patient have back pain flares up every 2-3 months. Patient has bilateral bowel dysfunction. Patient has radicular component down the right leg. Patient states loss of strengthbut no loss of sensation. Patient family for support of a cane. Patient rates the pain as a 9/10. No Palliative measures for complaint. Patient MRI of the lumbar spine 2 years ago showing no significant changes and mild bulging lumbar disc.   Past Medical History:  Diagnosis Date  . Chronic sciatica   . Diabetes mellitus without complication ([place])   . Lumbar radiculopathy   . Sciatica    Workers Comp    Patient Active Problem List   Diagnosis Date Noted  . Skin cyst 09/05/2015  . Breast cyst 09/05/2015  . Anemia 02/01/2015  . 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 Perimenopause 01/31/2015  . 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 Chronic sciatica     Past Surgical History:  Procedure Laterality Date  . COLONOSCOPY  2013  . etopic pregnancy      Prior to Admission medications   Medication Sig Start Date End Date Taking? Authorizing Provider  amitriptyline (ELAVIL) 25 MG tablet Take 25 mg by mouth at bedtime.  01/25/15   [provider]  BAYER CONTOUR NEXT TEST test strip 2 (two) times daily. 05/31/16   [provider]  cyclobenzaprine (FLEXERIL) 10 MG tablet Take 1 tablet (10 mg total) 3 (three) times daily as needed by mouth. 03/02/17   Sable Feil,  PA-C  cyclobenzaprine (FLEXERIL) 5 MG tablet Take 1 tablet (5 mg total) by mouth 3 (three) times daily as needed. 10/07/16   Little, Traci M, PA-C  meclizine (ANTIVERT) 25 MG tablet Take 1 tablet (25 mg total) by mouth 3 (three) times daily as needed for dizziness. 09/28/16   Volney American, PA-C  meloxicam (MOBIC) 7.5 MG tablet take 1 tablet by mouth twice a day if needed for pain 05/31/15   [provider]  meloxicam (MOBIC) 7.5 MG tablet Take 1 tablet (7.5 mg total) daily by mouth. 03/02/17 03/02/18  Sable Feil, PA-C  promethazine (PHENERGAN) 25 MG tablet Take 1 tablet (25 mg total) by mouth every 8 (eight) hours as needed for nausea or vomiting. 09/28/16   Volney American, PA-C  tiZANidine (ZANAFLEX) 4 MG tablet Take 4 mg by mouth 3 (three) times daily.  01/25/15   [provider]  traMADol (ULTRAM) 50 MG tablet Take 1 tablet (50 mg total) every 6 (six) hours as needed by mouth. 03/02/17 03/02/18  Sable Feil, PA-C    Allergies Patient has no known allergies.  Family History  Problem Relation Age of Onset  . Diabetes Mother   . Emphysema Brother   . Other Daughter        Scarcoidosis  . Aplastic anemia Daughter   . Kidney disease Daughter   . Cancer Daughter        Breast Bilateral Mastectomy age 75, kidney   .  Breast cancer Cousin     Social History Social History   Tobacco Use  . Smoking status: Never Smoker  . Smokeless tobacco: Never Used  Substance Use Topics  . Alcohol use: No  . Drug use: No    Review of Systems  Constitutional: No fever/chills Eyes: No visual changes. ENT: No sore throat. Cardiovascular: Denies chest pain. Respiratory: Denies shortness of breath. Gastrointestinal: No abdominal pain.  No nausea, no vomiting.  No diarrhea.  No constipation. Genitourinary: Negative for dysuria. Musculoskeletal: Chronic back pain with sciatica.  Skin: Negative for rash. Neurological: Negative for headaches, focal weakness  or numbness. Endocrine:Diabetes. ____________________________________________   PHYSICAL EXAM:  VITAL SIGNS: ED Triage Vitals  Enc Vitals Group     BP 03/02/17 1258 (!) 149/50     Pulse Rate 03/02/17 1258 77     Resp 03/02/17 1258 20     Temp 03/02/17 1258 98.4 F (36.9 C)     Temp Source 03/02/17 1258 Oral     SpO2 03/02/17 1258 100 %     Weight 03/02/17 1259 220 lb (99.8 kg)     Height 03/02/17 1259 5\' 3"  (1.6 m)     Head Circumference --      Peak Flow --      Pain Score 03/02/17 1257 8     Pain Loc --      Pain Edu? --      Excl. in [place]? --    Constitutional: Alert and oriented. Well appearing and in no acute distress. Cardiovascular: Normal rate, regular rhythm. Grossly normal heart sounds.  Good peripheral circulation. Respiratory: Normal respiratory effort.  No retractions. Lungs CTAB. Gastrointestinal: Soft and nontender. No distention. No abdominal bruits. No CVA tenderness. Musculoskeletal: No lower extremity tenderness nor edema.  No joint effusions. Neurologic:  Normal speech and language. No gross focal neurologic deficits are appreciated. No gait instability. Skin:  Skin is warm, dry and intact. No rash noted. Psychiatric: Mood and affect are normal. Speech and behavior are normal.  ____________________________________________   LABS (all labs ordered are listed, but only abnormal results are displayed)  Labs Reviewed - No data to display ____________________________________________  EKG   ____________________________________________  RADIOLOGY  No results found.  ____________________________________________   PROCEDURES  Procedure(s) performed: None  Procedures  Critical Care performed: No  ____________________________________________   INITIAL IMPRESSION / ASSESSMENT AND PLAN / ED COURSE  As part of my medical decision making, I reviewed the following data within the electronic MEDICAL RECORD NUMBER    Chronic back pain with sciatica.  Patient given discharge care instructions. Patient advised follow pain management clinic for definitive treatment.      ____________________________________________   FINAL CLINICAL IMPRESSION(S) / ED DIAGNOSES  Final diagnoses:  Chronic right-sided low back pain with right-sided sciatica     ED Discharge Orders        Ordered    meloxicam (MOBIC) 7.5 MG tablet  Daily     03/02/17 1330    traMADol (ULTRAM) 50 MG tablet  Every 6 hours PRN     03/02/17 1330    cyclobenzaprine (FLEXERIL) 10 MG tablet  3 times daily PRN     03/02/17 1330       Note:  This document was prepared using Dragon voice recognition software and may include unintentional dictation errors.    Sable Feil, PA-C 03/02/17 1336    Lisa Roca, MD 03/02/17 [phone]

## 2017-03-05 ENCOUNTER — Encounter: Payer: Worker's Compensation | Admitting: Physical Therapy

## 2017-03-12 ENCOUNTER — Encounter: Payer: Worker's Compensation | Admitting: Physical Therapy

## 2017-03-14 ENCOUNTER — Encounter: Payer: Worker's Compensation | Admitting: Physical Therapy

## 2017-03-18 ENCOUNTER — Encounter: Payer: Worker's Compensation | Admitting: Physical Therapy

## 2017-03-21 ENCOUNTER — Encounter: Payer: Self-pay | Admitting: Physical Therapy

## 2017-03-22 ENCOUNTER — Ambulatory Visit
Admission: RE | Admit: 2017-03-22 | Discharge: 2017-03-22 | Disposition: A | Discharge status: Home / Self Care | Payer: Medicare HMO | Source: Ambulatory Visit | Attending: Family Medicine | Admitting: Family Medicine

## 2017-03-22 DIAGNOSIS — Z1231 Encounter for screening mammogram for malignant neoplasm of breast: Secondary | ICD-10-CM | POA: Diagnosis not present

## 2017-03-25 ENCOUNTER — Encounter: Payer: Self-pay | Admitting: Physical Therapy

## 2017-03-28 ENCOUNTER — Encounter: Payer: Self-pay | Admitting: Physical Therapy

## 2017-04-02 ENCOUNTER — Encounter: Payer: Self-pay | Admitting: Physical Therapy

## 2017-04-17 ENCOUNTER — Other Ambulatory Visit: Payer: Self-pay

## 2017-04-17 ENCOUNTER — Encounter (INDEPENDENT_AMBULATORY_CARE_PROVIDER_SITE_OTHER): Payer: Self-pay

## 2017-04-17 ENCOUNTER — Ambulatory Visit
Admission: RE | Admit: 2017-04-17 | Discharge: 2017-04-17 | Disposition: A | Discharge status: Home / Self Care | Payer: BLUE CROSS/BLUE SHIELD | Source: Ambulatory Visit | Attending: Nurse Practitioner | Admitting: Nurse Practitioner

## 2017-04-17 ENCOUNTER — Encounter: Payer: Self-pay | Admitting: Nurse Practitioner

## 2017-04-17 ENCOUNTER — Ambulatory Visit: Payer: BLUE CROSS/BLUE SHIELD | Attending: Nurse Practitioner | Admitting: Nurse Practitioner

## 2017-04-17 VITALS — BP 136/77 | HR 73 | Temp 98.3°F | Ht 63.0 in | Wt 220.0 lb

## 2017-04-17 DIAGNOSIS — M5442 Lumbago with sciatica, left side: Principal | ICD-10-CM

## 2017-04-17 DIAGNOSIS — N189 Chronic kidney disease, unspecified: Secondary | ICD-10-CM | POA: Insufficient documentation

## 2017-04-17 DIAGNOSIS — E119 Type 2 diabetes mellitus without complications: Secondary | ICD-10-CM | POA: Diagnosis not present

## 2017-04-17 DIAGNOSIS — M79605 Pain in left leg: Secondary | ICD-10-CM | POA: Diagnosis not present

## 2017-04-17 DIAGNOSIS — Z79899 Other long term (current) drug therapy: Secondary | ICD-10-CM | POA: Insufficient documentation

## 2017-04-17 DIAGNOSIS — M25562 Pain in left knee: Secondary | ICD-10-CM | POA: Insufficient documentation

## 2017-04-17 DIAGNOSIS — M5441 Lumbago with sciatica, right side: Principal | ICD-10-CM

## 2017-04-17 DIAGNOSIS — M899 Disorder of bone, unspecified: Secondary | ICD-10-CM | POA: Diagnosis not present

## 2017-04-17 DIAGNOSIS — M533 Sacrococcygeal disorders, not elsewhere classified: Secondary | ICD-10-CM

## 2017-04-17 DIAGNOSIS — G8929 Other chronic pain: Secondary | ICD-10-CM | POA: Insufficient documentation

## 2017-04-17 DIAGNOSIS — M5136 Other intervertebral disc degeneration, lumbar region: Secondary | ICD-10-CM | POA: Diagnosis not present

## 2017-04-17 DIAGNOSIS — M79604 Pain in right leg: Secondary | ICD-10-CM | POA: Diagnosis not present

## 2017-04-17 DIAGNOSIS — E1122 Type 2 diabetes mellitus with diabetic chronic kidney disease: Secondary | ICD-10-CM | POA: Insufficient documentation

## 2017-04-17 DIAGNOSIS — G894 Chronic pain syndrome: Secondary | ICD-10-CM | POA: Diagnosis not present

## 2017-04-17 DIAGNOSIS — M1712 Unilateral primary osteoarthritis, left knee: Secondary | ICD-10-CM | POA: Diagnosis not present

## 2017-04-17 DIAGNOSIS — Z789 Other specified health status: Secondary | ICD-10-CM

## 2017-04-17 NOTE — Progress Notes (Signed)
Patient's Name: Meghan Vasquez  MRN: [id number]  Referring Provider: Donnie Coffin, MD  DOB: 26-Nov-1960  PCP: Donnie Coffin, MD  DOS: 04/17/2017  Note by: Dionisio David NP  Service setting: Ambulatory outpatient  Specialty: Interventional Pain Management  Location: [facility] (AMB) Pain Management Facility    Patient type: New Patient    Primary Reason(s) for Visit: Initial Patient Evaluation CC: No chief complaint on file.  HPI  Meghan Vasquez is a 57 y.o. year old, female patient, who comes today for an initial evaluation. 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 Her primarily concern today is the No chief complaint on file.  Pain Assessment: Location: Lower Back Radiating: radiates down right leg Onset: More than a month ago Quality: Aching, Discomfort, Constant Severity: 5 /10 (self-reported pain score)  Note: Reported level is compatible with observation.                          Timing: Constant Modifying factors: ice, elevate feet  Onset and Duration: Present longer than 3 months Cause of pain: Work related accident or event Severity: No change since onset, NAS-11 at its worse: 10/10, NAS-11 at its best: 5/10, NAS-11 now: 5/10 and NAS-11 on the average: 6/10 Timing: Night, Not influenced by the time of the day, During activity or exercise and After activity or exercise Aggravating Factors: Bending, Bowel movements, Intercourse (sex), Kneeling, Lifiting, Motion, Prolonged sitting, Prolonged standing, Squatting and Stooping  Alleviating Factors: Cold  packs, Hot packs, Medications and Sleeping Associated Problems: Spasms and Pain that wakes patient up Quality of Pain: Aching, Cramping, Disabling, Exhausting, Horrible and Pressure-like Previous Examinations or Tests: MRI scan Previous Treatments: The patient denies treatments  The patient comes into the clinics today for the first time for a chronic pain management evaluation. According to the patient her primary area of pain is in her lower back. She admits that her right side is worse than the left. She admits that she suffered a work related injury in 2014 and 2016. She denies any previous surgery on her back. She admits that she did have a lumbar epidural steroid 2014 which was a little effective. She admits this was done in [place]. She has had past physical therapy which was effective. She denies any recent images.  Her second area of pain is in her lower extremities. She admits that the right side is greater than the left. The pain goes into her thigh then occasionally radiates down into her toes. She admits that she does try to do home exercises. She checked tries to tolerate the pain as much as she can without medication.  Her third area of pain is in her left knee. She denies any previous injury. She has some weakness and swelling. She denies any previous interventional therapy or recent images.  Today I took the time to provide the patient with information regarding this pain practice. The patient was informed that the practice is divided into two sections: an interventional pain management section, as well as a completely separate and distinct medication management section. I explained  that there are procedure days for interventional therapies, and evaluation days for follow-ups and medication management. Because of the amount of documentation required during both, they are kept separated. This means that there is the possibility that she may be scheduled for a procedure on one day, and  medication management the next. I have also informed her that because of staffing and facility limitations, this practice will no longer take patients for medication management only. To illustrate the reasons for this, I gave the patient the example of surgeons, and how inappropriate it would be to refer a patient to his/her care, just to write for the post-surgical antibiotics on a surgery done by a different surgeon.   Because interventional pain management is part of the board-certified specialty for the doctors, the patient was informed that joining this practice means that they are open to any and all interventional therapies. I made it clear that this does not mean that they will be forced to have any procedures done. What this means is that I believe interventional therapies to be essential part of the diagnosis and proper management of chronic pain conditions. Therefore, patients not interested in these interventional alternatives will be better served under the care of a different practitioner.  The patient was also made aware of my Comprehensive Pain Management Safety Guidelines where by joining this practice, they limit all of their nerve blocks and joint injections to those done by our practice, for as long as we are retained to manage their care. Historic Controlled Substance Pharmacotherapy Review  PMP and historical list of controlled substances: Tramadol 50 mg Highest opioid analgesic regimen found: Tramadol 50 mg twice daily (last filled date 03/02/2017) ( tramadol 100 mg per day) Most recent opioid analgesic:  Tramadol 50 mg twice daily (last filled date 03/02/2017) ( tramadol 100 mg per day) Current opioid analgesics: None Highest recorded MME/day: 100 mg/day MME/day: 0 mg/day Medications: The patient did not bring the medication(s) to the appointment, as requested in our "New Patient Package" Pharmacodynamics: Desired effects: Analgesia: The patient reports >50% benefit. Reported  improvement in function: The patient reports medication allows her to accomplish basic ADLs. Clinically meaningful improvement in function (CMIF): Sustained CMIF goals met Perceived effectiveness: Described as relatively effective, allowing for increase in activities of daily living (ADL) Undesirable effects: Side-effects or Adverse reactions: None reported Historical Monitoring: The patient  reports that she does not use drugs. List of all UDS Test(s): No results found for: MDMA, COCAINSCRNUR, PCPSCRNUR, PCPQUANT, CANNABQUANT, THCU, [place] List of all Serum Drug Screening Test(s):  No results found for: AMPHSCRSER, BARBSCRSER, BENZOSCRSER, COCAINSCRSER, PCPSCRSER, PCPQUANT, THCSCRSER, CANNABQUANT, OPIATESCRSER, OXYSCRSER, PROPOXSCRSER Historical Background Evaluation: [place] PDMP: Six (6) year initial data search conducted.             [place] Department of public safety, offender search: Editor, commissioning Information) Non-contributory Risk Assessment Profile: Aberrant behavior: None observed or detected today Risk factors for fatal opioid overdose: None identified today Fatal overdose hazard ratio (HR): Calculation deferred Non-fatal overdose hazard ratio (HR): Calculation deferred Risk of opioid abuse or dependence: 0.7-3.0% with doses ? 36 MME/day and 6.1-26% with doses ? 120 MME/day. Substance use disorder (SUD) risk level: Pending results of Medical Psychology Evaluation for SUD Opioid risk tool (ORT) (Total Score): 2  ORT Scoring interpretation table:  Score <3 = Low Risk for SUD  Score between 4-7 = Moderate Risk for SUD  Score >8 = High Risk for Opioid Abuse   PHQ-2 Depression Scale:  Total score: 0  PHQ-2 Scoring interpretation table: (Score and probability of major depressive disorder)  Score 0 = No depression  Score 1 = 15.4% Probability  Score 2 = 21.1% Probability  Score 3 = 38.4% Probability  Score 4 = 45.5% Probability  Score 5 = 56.4% Probability  Score 6 = 78.6% Probability   PHQ-9  Depression Scale:  Total score: 0  PHQ-9 Scoring interpretation table:  Score 0-4 = No depression  Score 5-9 = Mild depression  Score 10-14 = Moderate depression  Score 15-19 = Moderately severe depression  Score 20-27 = Severe depression (2.4 times higher risk of SUD and 2.89 times higher risk of overuse)   Pharmacologic Plan: Pending ordered tests and/or consults  Meds  The patient has a current medication list which includes the following prescription(s): amitriptyline, bayer contour next test, cyclobenzaprine, cyclobenzaprine, meclizine, meloxicam, meloxicam, promethazine, tizanidine, and tramadol.  Current Outpatient Medications on File Prior to Visit  Medication Sig  . amitriptyline (ELAVIL) 25 MG tablet Take 25 mg by mouth at bedtime.   Marland Kitchen BAYER CONTOUR NEXT TEST test strip 2 (two) times daily.  . cyclobenzaprine (FLEXERIL) 10 MG tablet Take 1 tablet (10 mg total) 3 (three) times daily as needed by mouth. (Patient not taking: Reported on 04/17/2017)  . cyclobenzaprine (FLEXERIL) 5 MG tablet Take 1 tablet (5 mg total) by mouth 3 (three) times daily as needed. (Patient not taking: Reported on 04/17/2017)  . meclizine (ANTIVERT) 25 MG tablet Take 1 tablet (25 mg total) by mouth 3 (three) times daily as needed for dizziness. (Patient not taking: Reported on 04/17/2017)  . meloxicam (MOBIC) 7.5 MG tablet take 1 tablet by mouth twice a day if needed for pain  . meloxicam (MOBIC) 7.5 MG tablet Take 1 tablet (7.5 mg total) daily by mouth. (Patient not taking: Reported on 04/17/2017)  . promethazine (PHENERGAN) 25 MG tablet Take 1 tablet (25 mg total) by mouth every 8 (eight) hours as needed for nausea or vomiting. (Patient not taking: Reported on 04/17/2017)  . tiZANidine (ZANAFLEX) 4 MG tablet Take 4 mg by mouth 3 (three) times daily.   . traMADol (ULTRAM) 50 MG tablet Take 1 tablet (50 mg total) every 6 (six) hours as needed by mouth. (Patient not taking: Reported on 04/17/2017)   No current  facility-administered medications on file prior to visit.    Imaging Review   Lumbosacral Imaging: Lumbar MR wo contrast:  Results for orders placed during the hospital encounter of 10/25/14  MR Lumbar Spine Wo Contrast   Narrative CLINICAL DATA:  57 year old female injured lifting January 2016 female with low back pain and bilateral buttock and leg pain extending to toes with spasms. Right upper leg numbness laterally. Initial encounter.  EXAM: MRI LUMBAR SPINE WITHOUT CONTRAST  TECHNIQUE: Multiplanar, multisequence MR imaging of the lumbar spine was performed. No intravenous contrast was administered.  COMPARISON:  None.  FINDINGS: Last fully open disk space is labeled L5-S1. Present examination incorporates from T11-12 disc space through the lower sacrum.  Conus L1 level.  Heterogeneous uterus suggestive of presence of fibroids. Additionally, loculated fluid appearing structure of left aspect of the uterus possibly representing hydro salpinx (versus fluid-filled small bowel). Pelvic sonogram recommended for further delineation.  T11-12: Minimal bulge.  Anterior osteophyte.  T12-L1:  Negative.  L1-2:  Negative.  L2-3:  Minimal bulge.  Minimal facet joint degenerative changes.  L3-4: Mild facet joint degenerative changes and ligamentum flavum hypertrophy.  L4-5: Moderate right-sided and mild-to-moderate left-sided facet joint degenerative changes. Ligamentum  flavum hypertrophy greater on the right. Minimal bulge greater to the right.  L5-S1: Marked left-sided and moderate right-sided facet joint degenerative changes. Minimal bulge.  IMPRESSION: Degenerative changes as noted above most notable involving facet joints without evidence of significant spinal stenosis, foraminal narrowing or nerve root compression.  Heterogeneous uterus suggestive of presence of fibroids. Additionally, loculated fluid appearing structure of left aspect of the uterus possibly  representing hydro salpinx (versus fluid-filled small bowel). Pelvic sonogram recommended for further delineation.   Electronically Signed   By: Genia Del M.D.   On: 10/25/2014 14:46    Note: Available results from prior imaging studies were reviewed.        ROS  Cardiovascular History: No reported cardiovascular signs or symptoms such as High blood pressure, coronary artery disease, abnormal heart rate or rhythm, heart attack, blood thinner therapy or heart weakness and/or failure Pulmonary or Respiratory History: Snoring  Neurological History: No reported neurological signs or symptoms such as seizures, abnormal skin sensations, urinary and/or fecal incontinence, being born with an abnormal open spine and/or a tethered spinal cord Review of Past Neurological Studies: No results found for this or any previous visit. Psychological-Psychiatric History: No reported psychological or psychiatric signs or symptoms such as difficulty sleeping, anxiety, depression, delusions or hallucinations (schizophrenial), mood swings (bipolar disorders) or suicidal ideations or attempts Gastrointestinal History: No reported gastrointestinal signs or symptoms such as vomiting or evacuating blood, reflux, heartburn, alternating episodes of diarrhea and constipation, inflamed or scarred liver, or pancreas or irrregular and/or infrequent bowel movements Genitourinary History: No reported renal or genitourinary signs or symptoms such as difficulty voiding or producing urine, peeing blood, non-functioning kidney, kidney stones, difficulty emptying the bladder, difficulty controlling the flow of urine, or chronic kidney disease Hematological History: No reported hematological signs or symptoms such as prolonged bleeding, low or poor functioning platelets, bruising or bleeding easily, hereditary bleeding problems, low energy levels due to low hemoglobin or being anemic Endocrine History: No reported endocrine signs or  symptoms such as high or low blood sugar, rapid heart rate due to high thyroid levels, obesity or weight gain due to slow thyroid or thyroid disease Rheumatologic History: No reported rheumatological signs and symptoms such as fatigue, joint pain, tenderness, swelling, redness, heat, stiffness, decreased range of motion, with or without associated rash Musculoskeletal History: Negative for myasthenia gravis, muscular dystrophy, multiple sclerosis or malignant hyperthermia Work History: Disabled  Allergies  Meghan Vasquez has No Known Allergies.  Laboratory Chemistry  Inflammation Markers No results found for: CRP, ESRSEDRATE (CRP: Acute Phase) (ESR: Chronic Phase) Renal Function Markers Lab Results  Component Value Date   BUN 16 07/01/2015   CREATININE 1.11 (H) 07/01/2015   GFRAA 66 07/01/2015   GFRNONAA 57 (L) 07/01/2015   Hepatic Function Markers Lab Results  Component Value Date   AST 18 07/01/2015   ALT 18 07/01/2015   ALBUMIN 4.2 07/01/2015   ALKPHOS 66 07/01/2015   Electrolytes Lab Results  Component Value Date   NA 139 07/01/2015   K 4.3 07/01/2015   CL 100 07/01/2015   CALCIUM 9.6 07/01/2015   Neuropathy Markers No results found for: [id number] Bone Pathology Markers Lab Results  Component Value Date   ALKPHOS 66 07/01/2015   CALCIUM 9.6 07/01/2015   Coagulation Parameters Lab Results  Component Value Date   PLT 249 12/14/2015   Cardiovascular Markers Lab Results  Component Value Date   HGB 11.8 12/14/2015   HCT 35.0 12/14/2015   Note: Lab results reviewed.  PFSH  Drug: Meghan Vasquez  reports that she does not use drugs. Alcohol:  reports that she does not drink alcohol. Tobacco:  reports that  has never smoked. she has never used smokeless tobacco. Medical:  has a past medical history of Chronic sciatica, Diabetes mellitus without complication ([place]), Lumbar radiculopathy, and Sciatica. Family: family history includes Aplastic anemia in her daughter;  Breast cancer in her cousin; Cancer in her daughter; Diabetes in her mother; Emphysema in her brother; Kidney disease in her daughter; Other in her daughter.  Past Surgical History:  Procedure Laterality Date  . COLONOSCOPY  2013  . etopic pregnancy     Active Ambulatory Problems    Diagnosis Date Noted  . 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 Chronic sciatica   . 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 Perimenopause 01/31/2015  . Anemia 02/01/2015  . Skin cyst 09/05/2015  . Breast cyst 09/05/2015  . Chronic low back pain (Primary Area of Pain) (B) (R>L) 04/17/2017  . Chronic pain of lower extremity (Secondary Area of Pain) (B) (R>L 04/17/2017  . Knee pain, chronic (Tertiary Area of Pain) (left) 04/17/2017  . Chronic pain syndrome 04/17/2017  . Disorder of bone, unspecified 04/17/2017  . Other specified health status 04/17/2017  . Other long term (current) drug therapy 04/17/2017   Resolved Ambulatory Problems    Diagnosis Date Noted  . No Resolved Ambulatory Problems   Past Medical History:  Diagnosis Date  . Chronic sciatica   . Diabetes mellitus without complication ([place])   . Lumbar radiculopathy   . Sciatica    Constitutional Exam  General appearance: Well nourished, well developed, and well hydrated. In no apparent acute distress Vitals:   04/17/17 1316 04/17/17 1317  BP:  136/77  Pulse:  73  Temp:  98.3 F (36.8 C)  SpO2:  100%  Weight: 220 lb (99.8 kg)   Height: 5' 3"  (1.6 m)    BMI Assessment: Estimated body mass index is 38.97 kg/m as calculated from the following:   Height as of this encounter: 5' 3"  (1.6 m).   Weight as of this encounter: 220 lb (99.8 kg).  BMI interpretation table: BMI level Category Range association with higher incidence of chronic pain  <18 kg/m2 Underweight   18.5-24.9 kg/m2 Ideal body weight   25-29.9 kg/m2 Overweight Increased incidence by 20%  30-34.9 kg/m2 Obese (Class I) Increased incidence  by 68%  35-39.9 kg/m2 Severe obesity (Class II) Increased incidence by 136%  >40 kg/m2 Extreme obesity (Class III) Increased incidence by 254%   BMI Readings from Last 4 Encounters:  04/17/17 38.97 kg/m  03/02/17 38.97 kg/m  10/07/16 39.43 kg/m  09/28/16 39.43 kg/m   Wt Readings from Last 4 Encounters:  04/17/17 220 lb (99.8 kg)  03/02/17 220 lb (99.8 kg)  10/07/16 224 lb (101.6 kg)  09/28/16 224 lb (101.6 kg)  Psych/Mental status: Alert, oriented x 3 (person, place, & time)       Eyes: PERLA Respiratory: No evidence of acute respiratory distress  Cervical Spine Exam  Inspection: No masses, redness, or swelling Alignment: Symmetrical Functional ROM: Unrestricted ROM      Stability: No instability detected Muscle strength & Tone: Functionally intact Sensory: Unimpaired Palpation: No palpable anomalies              Upper Extremity (UE) Exam    Side: Right upper extremity  Side: Left upper extremity  Inspection: No masses, redness, swelling, or asymmetry. No contractures  Inspection: No masses, redness, swelling, or asymmetry. No contractures  Functional ROM: Unrestricted ROM          Functional ROM: Unrestricted ROM          Muscle strength & Tone: Functionally intact  Muscle strength & Tone: Functionally intact  Sensory: Unimpaired  Sensory: Unimpaired  Palpation: No palpable anomalies              Palpation: No palpable anomalies              Specialized Test(s): Deferred         Specialized Test(s): Deferred          Thoracic Spine Exam  Inspection: No masses, redness, or swelling Alignment: Symmetrical Functional ROM: Unrestricted ROM Stability: No instability detected Sensory: Unimpaired Muscle strength & Tone: No palpable anomalies  Lumbar Spine Exam  Inspection: No masses, redness, or swelling Alignment: Symmetrical Functional ROM: Unrestricted ROM      Stability: No instability detected Muscle strength & Tone: Functionally intact Sensory:  Unimpaired Palpation: No palpable anomalies       Provocative Tests: Lumbar Hyperextension and rotation test: evaluation deferred today       Patrick's Maneuver: evaluation deferred today                    Gait & Posture Assessment  Ambulation: Unassisted Gait: Relatively normal for age and body habitus Posture: WNL   Lower Extremity Exam    Side: Right lower extremity  Side: Left lower extremity  Inspection: No masses, redness, swelling, or asymmetry. No contractures  Inspection: No masses, redness, swelling, or asymmetry. No contractures  Functional ROM: Unrestricted ROM          Functional ROM: Unrestricted ROM          Muscle strength & Tone: Functionally intact  Muscle strength & Tone: Functionally intact  Sensory: Unimpaired  Sensory: Unimpaired  Palpation: No palpable anomalies  Palpation: No palpable anomalies   Assessment  Primary Diagnosis & Pertinent Problem List: The primary encounter diagnosis was Chronic sacroiliac joint pain. Diagnoses of Chronic bilateral low back pain with bilateral sciatica, Chronic pain of both lower extremities, Chronic pain of left knee, Chronic pain syndrome, Disorder of bone, unspecified, Other specified health status, and Other long term (current) drug therapy were also pertinent to this visit.  Visit Diagnosis: 1. Chronic sacroiliac joint pain   2. Chronic bilateral low back pain with bilateral sciatica   3. Chronic pain of both lower extremities   4. Chronic pain of left knee   5. Chronic pain syndrome   6. Disorder of bone, unspecified   7. Other specified health status   8. Other long term (current) drug therapy    Plan of Care  Initial treatment plan:  Please be advised that as per protocol, today's visit has been an evaluation only. We have not taken over the patient's controlled substance management.  Problem-specific plan: No problem-specific Assessment & Plan notes found for this encounter.  Ordered Lab-work, Procedure(s),  Referral(s), & Consult(s): Orders Placed This Encounter  Procedures  . DG Lumbar Spine Complete W/Bend  . DG Si Joints  . DG Knee 1-2 Views Left  . Compliance Drug Analysis, Ur  . Comp. Metabolic Panel (12)  . Magnesium  . Vitamin B12  . Sedimentation rate  . 25-Hydroxyvitamin D Lcms D2+D3  . C-reactive protein  . Drug Screen 10 W/Conf, Serum  . Ambulatory referral to Psychology   Pharmacotherapy: Medications ordered:  No  orders of the defined types were placed in this encounter.  Medications administered during this visit: [name] had no medications administered during this visit.   Pharmacotherapy under consideration:  Opioid Analgesics: The patient was informed that there is no guarantee that she would be a candidate for opioid analgesics. The decision will be made following CDC guidelines. This decision will be based on the results of diagnostic studies, as well as Ms. Foster's risk profile.  Membrane stabilizer: To be determined at a later time Muscle relaxant: To be determined at a later time NSAID: To be determined at a later time Other analgesic(s): To be determined at a later time   Interventional therapies under consideration: Meghan Vasquez was informed that there is no guarantee that she would be a candidate for interventional therapies. The decision will be based on the results of diagnostic studies, as well as Ms. Foster's risk profile.  Possible procedure(s): Diagnostic bilateral LESI Diagnostic bilateral lumbar facet nerve block Possible bilateral lumbar facet RFA Diagnostic left-sided intra-articular knee injection Diagnostic left sided Hyalgan series Diagnostic left-sided genicular nerve block Possible left-sided genicular RFA    Provider-requested follow-up: Return for 2nd appt with Dr Lowella Dandy.  No future appointments.  Primary Care Physician: Donnie Coffin, MD Location: [facility] Note by:  Date: 04/17/2017; Time: 3:29  PM  Pain Score Disclaimer: We use the NRS-11 scale. This is a self-reported, subjective measurement of pain severity with only modest accuracy. It is used primarily to identify changes within a particular patient. It must be understood that outpatient pain scales are significantly less accurate that those used for research, where they can be applied under ideal controlled circumstances with minimal exposure to variables. In reality, the score is likely to be a combination of pain intensity and pain affect, where pain affect describes the degree of emotional arousal or changes in action readiness caused by the sensory experience of pain. Factors such as social and work situation, setting, emotional state, anxiety levels, expectation, and prior pain experience may influence pain perception and show large inter-individual differences that may also be affected by time variables.  Patient instructions provided during this appointment: There are no Patient Instructions on file for this visit.

## 2017-04-17 NOTE — Patient Instructions (Signed)

## 2017-04-18 NOTE — Progress Notes (Signed)
Results were reviewed and found to be: abnormal  Further testing may be useful  Review would suggest interventional pain management techniques may be of benefit

## 2017-04-18 NOTE — Progress Notes (Signed)
Results were reviewed and found to be: mildly abnormal  No acute injury or pathology identified  Review would suggest interventional pain management techniques may be of benefit 

## 2017-04-21 LAB — DRUG SCREEN 10 W/CONF, SERUM
Amphetamines, IA: NEGATIVE ng/mL
Barbiturates, IA: NEGATIVE ug/mL
Benzodiazepines, IA: NEGATIVE ng/mL
Cocaine & Metabolite, IA: NEGATIVE ng/mL
METHADONE, IA: NEGATIVE ng/mL
OXYCODONES, IA: NEGATIVE ng/mL
Opiates, IA: NEGATIVE ng/mL
PHENCYCLIDINE, IA: NEGATIVE ng/mL
PROPOXYPHENE, IA: NEGATIVE ng/mL
THC(Marijuana) Metabolite, IA: NEGATIVE ng/mL

## 2017-04-22 ENCOUNTER — Telehealth: Payer: Self-pay | Admitting: Pain Medicine

## 2017-04-22 NOTE — Telephone Encounter (Signed)
Patient called for 2nd visit, sched Jan 14, she would like to know results of xrays because she can barely walk

## 2017-04-22 NOTE — Telephone Encounter (Signed)
Attempted to call patient to inform her of x-ray results. Message left.

## 2017-04-22 NOTE — Telephone Encounter (Signed)
X-ray results read to patient. 

## 2017-04-23 LAB — COMP. METABOLIC PANEL (12)
ALK PHOS: 96 IU/L (ref 39–117)
AST: 14 IU/L (ref 0–40)
Albumin/Globulin Ratio: 1.6 (ref 1.2–2.2)
Albumin: 4.6 g/dL (ref 3.5–5.5)
BUN/Creatinine Ratio: 14 (ref 9–23)
BUN: 17 mg/dL (ref 6–24)
CREATININE: 1.23 mg/dL — AB (ref 0.57–1.00)
Calcium: 10 mg/dL (ref 8.7–10.2)
Chloride: 101 mmol/L (ref 96–106)
GFR calc Af Amer: 57 mL/min/{1.73_m2} — ABNORMAL LOW (ref >59)
GFR calc non Af Amer: 49 mL/min/{1.73_m2} — ABNORMAL LOW (ref >59)
Globulin, Total: 2.9 g/dL (ref 1.5–4.5)
Glucose: 83 mg/dL (ref 65–99)
Potassium: 4.4 mmol/L (ref 3.5–5.2)
SODIUM: 142 mmol/L (ref 134–144)
Total Protein: 7.5 g/dL (ref 6.0–8.5)

## 2017-04-23 LAB — SEDIMENTATION RATE

## 2017-04-23 LAB — VITAMIN B12: VITAMIN B 12: 739 pg/mL (ref 232–1245)

## 2017-04-23 LAB — 25-HYDROXY VITAMIN D LCMS D2+D3: 25-Hydroxy, Vitamin D-3: 39 ng/mL

## 2017-04-23 LAB — 25-HYDROXYVITAMIN D LCMS D2+D3: 25-HYDROXY, VITAMIN D: 39 ng/mL

## 2017-04-23 LAB — C-REACTIVE PROTEIN: CRP: 6.4 mg/L — ABNORMAL HIGH (ref 0.0–4.9)

## 2017-04-23 LAB — MAGNESIUM: MAGNESIUM: 1.9 mg/dL (ref 1.6–2.3)

## 2017-04-29 ENCOUNTER — Ambulatory Visit: Payer: BLUE CROSS/BLUE SHIELD | Attending: Pain Medicine | Admitting: Pain Medicine

## 2017-04-29 ENCOUNTER — Other Ambulatory Visit: Payer: Self-pay

## 2017-04-29 ENCOUNTER — Ambulatory Visit: Payer: Medicare HMO | Admitting: Pain Medicine

## 2017-04-29 ENCOUNTER — Encounter: Payer: Self-pay | Admitting: Pain Medicine

## 2017-04-29 VITALS — BP 150/73 | HR 77 | Temp 98.3°F | Resp 16 | Ht 63.0 in | Wt 224.0 lb

## 2017-04-29 DIAGNOSIS — M542 Cervicalgia: Secondary | ICD-10-CM | POA: Diagnosis not present

## 2017-04-29 DIAGNOSIS — M545 Low back pain: Secondary | ICD-10-CM | POA: Diagnosis present

## 2017-04-29 DIAGNOSIS — M5441 Lumbago with sciatica, right side: Secondary | ICD-10-CM | POA: Diagnosis not present

## 2017-04-29 DIAGNOSIS — M5116 Intervertebral disc disorders with radiculopathy, lumbar region: Secondary | ICD-10-CM | POA: Insufficient documentation

## 2017-04-29 DIAGNOSIS — Z79899 Other long term (current) drug therapy: Secondary | ICD-10-CM | POA: Insufficient documentation

## 2017-04-29 DIAGNOSIS — G894 Chronic pain syndrome: Secondary | ICD-10-CM | POA: Diagnosis not present

## 2017-04-29 DIAGNOSIS — M79604 Pain in right leg: Secondary | ICD-10-CM | POA: Diagnosis not present

## 2017-04-29 DIAGNOSIS — M25562 Pain in left knee: Secondary | ICD-10-CM | POA: Diagnosis not present

## 2017-04-29 DIAGNOSIS — M5416 Radiculopathy, lumbar region: Secondary | ICD-10-CM | POA: Diagnosis not present

## 2017-04-29 DIAGNOSIS — M47816 Spondylosis without myelopathy or radiculopathy, lumbar region: Secondary | ICD-10-CM | POA: Insufficient documentation

## 2017-04-29 DIAGNOSIS — G8929 Other chronic pain: Secondary | ICD-10-CM | POA: Diagnosis not present

## 2017-04-29 DIAGNOSIS — M5442 Lumbago with sciatica, left side: Secondary | ICD-10-CM

## 2017-04-29 DIAGNOSIS — M1712 Unilateral primary osteoarthritis, left knee: Secondary | ICD-10-CM | POA: Diagnosis not present

## 2017-04-29 DIAGNOSIS — M5136 Other intervertebral disc degeneration, lumbar region: Secondary | ICD-10-CM

## 2017-04-29 DIAGNOSIS — M79605 Pain in left leg: Secondary | ICD-10-CM

## 2017-04-29 MED ORDER — PREDNISONE 20 MG PO TABS: ORAL_TABLET | ORAL | 0 refills | Status: AC

## 2017-04-29 NOTE — Patient Instructions (Signed)

## 2017-04-29 NOTE — Progress Notes (Signed)
Patient's Name: Meghan Vasquez  MRN: [id number]  Referring Provider: Donnie Coffin, MD  DOB: [birth year]  PCP: Donnie Coffin, MD  DOS: 04/29/2017  Note by: Gaspar Cola, MD  Service setting: Ambulatory outpatient  Specialty: Interventional Pain Management  Location: [facility] (AMB) Pain Management Facility    Patient type: Established   Primary Reason(s) for Visit: Encounter for evaluation before starting new chronic pain management plan of care (Level of risk: moderate) CC: Back Pain (lower); Knee Pain (right); and Neck Pain (shoulders bilateral)  HPI  Meghan Vasquez is a 57 y.o. year old, female patient, who comes today for a follow-up evaluation to review the test results and decide on a treatment plan. 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[place]); Infertility of tubal origin; Perimenopause; Anemia; Skin cyst; Breast cyst; Chronic low back pain (Primary Area of Pain) (Bilateral) (R>L); Chronic lower extremity pain (Secondary Area of Pain) (Bilateral) (R>L); Chronic knee pain [facility] Area of Pain) (Left); Chronic pain syndrome; Disorder of bone, unspecified; Other specified health status; Other long term (current) drug therapy; Lumbar facet syndrome (Bilateral) (R>L); Osteoarthritis of knee (Left); Tricompartment osteoarthritis of knee (Left); DDD (degenerative disc disease), lumbar; Lumbar facet osteoarthritis; Osteoarthritis of  Lumbar spine; and Chronic lumbar radicular pain on their problem list. Her primarily concern today is the Back Pain (lower); Knee Pain (right); and Neck Pain (shoulders bilateral)  Pain Assessment: Location:   Back Radiating: down back of legs bilaterall to thigh, right is worse Onset: More than a month ago Duration: Chronic pain Quality: Aching, Constant, Discomfort Severity: 9 /10 (self-reported pain score)  Note: Reported level is inconsistent with clinical observations. Clinically the patient looks like a 2/10 A 2/10 is viewed as "Mild to  Moderate" and described as noticeable and distracting. Impossible to hide from other people. More frequent flare-ups. Still possible to adapt and function close to normal. It can be very annoying and may have occasional stronger flare-ups. With discipline, patients may get used to it and adapt. Information on the proper use of the pain scale provided to the patient today. When using our objective Pain Scale, levels between 6 and 10/10 are said to belong in an emergency room, as it progressively worsens from a 6/10, described as severely limiting, requiring emergency care not usually available at an outpatient pain management facility. At a 6/10 level, communication becomes difficult and requires great effort. Assistance to reach the emergency department may be required. Facial flushing and profuse sweating along with potentially dangerous increases in heart rate and blood pressure will be evident. Effect on ADL: prolonged walking, lifting, bathing, twistne, bending Timing: Constant Modifying factors: ice, elevate feet  Meghan Vasquez comes in today for a follow-up visit after her initial evaluation on 04/22/2017. Today we went over the results of her tests. These were explained in "Layman's terms". During today's appointment we went over my diagnostic impression, as well as the proposed treatment plan.  According to the patient her primary area of pain is in her lower back. She admits that her right side is worse than the left. She admits that she suffered a work related injury in 2014 and 2016. She denies any previous surgery on her back. She admits that she did have a lumbar epidural steroid 2014 which was a little effective. She admits this was done in [place]. She has had past physical therapy which was effective. She denies any recent images.  Her second area of pain is in her lower extremities.  She admits that the right side is greater than the left. The pain goes into her thigh then occasionally radiates  down into her toes. Top of feet and big toe. She admits that she does try to do home exercises. She checked tries to tolerate the pain as much as she can without medication.  Her third area of pain is in her left knee. She denies any previous injury. She has some weakness and swelling. She denies any previous interventional therapy or recent images.  In considering the treatment plan options, Meghan Vasquez was reminded that I no longer take patients for medication management only. I asked her to let me know if she had no intention of taking advantage of the interventional therapies, so that we could make arrangements to provide this space to someone interested. I also made it clear that undergoing interventional therapies for the purpose of getting pain medications is very inappropriate on the part of a patient, and it will not be tolerated in this practice. This type of behavior would suggest true addiction and therefore it requires referral to an addiction specialist.   Further details on both, my assessment(s), as well as the proposed treatment plan, please see below.  Controlled Substance Pharmacotherapy Assessment REMS (Risk Evaluation and Mitigation Strategy)  Analgesic: None Highest recorded MME/day: 100 mg/day MME/day: 0 mg/day Pill Count: None expected due to no prior prescriptions written by our practice. No notes on file Pharmacokinetics: Liberation and absorption (onset of action): WNL Distribution (time to peak effect): WNL Metabolism and excretion (duration of action): WNL         Pharmacodynamics: Desired effects: Analgesia: Meghan Vasquez reports >50% benefit. Functional ability: Patient reports that medication allows her to accomplish basic ADLs Clinically meaningful improvement in function (CMIF): Sustained CMIF goals met Perceived effectiveness: Described as relatively effective, allowing for increase in activities of daily living (ADL) Undesirable effects: Side-effects or  Adverse reactions: None reported Monitoring: [place] PMP: Online review of the past 25-monthperiod previously conducted. Not applicable at this point since we have not taken over the patient's medication management yet. List of other Serum/Urine Drug Screening Test(s):  Lab Results  Component Value Date   AMPHSCRSER Negative 04/17/2017   BARBSCRSER Negative 04/17/2017   BENZOSCRSER Negative 04/17/2017   COCAINSCRSER Negative 04/17/2017   PCPSCRSER Negative 04/17/2017   THCSCRSER Negative 04/17/2017   OPIATESCRSER Negative 04/17/2017   OXYSCRSER Negative 04/17/2017   PROPOXSCRSER Negative 04/17/2017   List of all UDS test(s) done:  No results found for: TOXASSSELUR, SUMMARY Last UDS on record: No results found for: TOXASSSELUR, SUMMARY UDS interpretation: Not applicable.          Medication Assessment Form: Not applicable. Treatment compliance: Not applicable Risk Assessment Profile: Aberrant behavior: See initial evaluations. None observed or detected today Comorbid factors increasing risk of overdose: See initial evaluation. No additional risks detected today Medical Psychology Evaluation: Low Risk Opioid Risk Tool - 04/29/17 1146      Family History of Substance Abuse   Alcohol  Negative    Illegal Drugs  Positive Female    Rx Drugs  Negative      Personal History of Substance Abuse   Alcohol  Negative    Illegal Drugs  Negative    Rx Drugs  Negative      Age   Age between 145-45years   No      History of Preadolescent Sexual Abuse   History of Preadolescent Sexual Abuse  Negative or Female  Psychological Disease   Psychological Disease  Negative    Depression  Negative      Total Score   Opioid Risk Tool Scoring  2    Opioid Risk Interpretation  Low Risk      ORT Scoring interpretation table:  Score <3 = Low Risk for SUD  Score between 4-7 = Moderate Risk for SUD  Score >8 = High Risk for Opioid Abuse   Risk Mitigation Strategies:  Patient opioid safety  counseling:  Not applicable. Patient-Prescriber Agreement (PPA): No agreement signed.  Controlled substance notification to other providers: Not applicable  Pharmacologic Plan: Meghan Vasquez has voiced her wishes to stay away from opioid analgesics.             Laboratory Chemistry  Inflammation Markers (CRP: Acute Phase) (ESR: Chronic Phase) Lab Results  Component Value Date   CRP 6.4 (H) 04/17/2017   ESRSEDRATE CANCELED 04/17/2017                 Renal Function Markers Lab Results  Component Value Date   BUN 17 04/17/2017   CREATININE 1.23 (H) 04/17/2017   GFRAA 57 (L) 04/17/2017   GFRNONAA 49 (L) 04/17/2017                 Hepatic Function Markers Lab Results  Component Value Date   AST 14 04/17/2017   ALT 18 07/01/2015   ALBUMIN 4.6 04/17/2017   ALKPHOS 96 04/17/2017                 Electrolytes Lab Results  Component Value Date   NA 142 04/17/2017   K 4.4 04/17/2017   CL 101 04/17/2017   CALCIUM 10.0 04/17/2017   MG 1.9 04/17/2017                 Neuropathy Markers Lab Results  Component Value Date   ZOXWRUEA54 098 04/17/2017   HIV Non Reactive 01/31/2015                 Bone Pathology Markers Lab Results  Component Value Date   25OHVITD1 39 04/17/2017   25OHVITD2 <1.0 04/17/2017   25OHVITD3 39 04/17/2017                 Coagulation Parameters Lab Results  Component Value Date   PLT 249 12/14/2015                 Cardiovascular Markers Lab Results  Component Value Date   HGB 11.8 12/14/2015   HCT 35.0 12/14/2015                 Note: Lab results reviewed.  Recent Diagnostic Imaging Review  Lumbosacral Imaging: Lumbar MR wo contrast:  Results for orders placed during the hospital encounter of 10/25/14  MR Lumbar Spine Wo Contrast   Narrative CLINICAL DATA:  57 year old female injured lifting January 2016 female with low back pain and bilateral buttock and leg pain extending to toes with spasms. Right upper leg numbness laterally.  Initial encounter.  EXAM: MRI LUMBAR SPINE WITHOUT CONTRAST  TECHNIQUE: Multiplanar, multisequence MR imaging of the lumbar spine was performed. No intravenous contrast was administered.  COMPARISON:  None.  FINDINGS: Last fully open disk space is labeled L5-S1. Present examination incorporates from T11-12 disc space through the lower sacrum.  Conus L1 level.  Heterogeneous uterus suggestive of presence of fibroids. Additionally, loculated fluid appearing structure of left aspect of the uterus possibly representing hydro salpinx (versus fluid-filled small  bowel). Pelvic sonogram recommended for further delineation.  T11-12: Minimal bulge.  Anterior osteophyte.  T12-L1:  Negative.  L1-2:  Negative.  L2-3:  Minimal bulge.  Minimal facet joint degenerative changes.  L3-4: Mild facet joint degenerative changes and ligamentum flavum hypertrophy.  L4-5: Moderate right-sided and mild-to-moderate left-sided facet joint degenerative changes. Ligamentum flavum hypertrophy greater on the right. Minimal bulge greater to the right.  L5-S1: Marked left-sided and moderate right-sided facet joint degenerative changes. Minimal bulge.  IMPRESSION: Degenerative changes as noted above most notable involving facet joints without evidence of significant spinal stenosis, foraminal narrowing or nerve root compression.  Heterogeneous uterus suggestive of presence of fibroids. Additionally, loculated fluid appearing structure of left aspect of the uterus possibly representing hydro salpinx (versus fluid-filled small bowel). Pelvic sonogram recommended for further delineation.   Electronically Signed   By: Genia Del M.D.   On: 10/25/2014 14:46    Lumbar DG Bending views:  Results for orders placed during the hospital encounter of 04/17/17  DG Lumbar Spine Complete W/Bend   Narrative CLINICAL DATA:  Chronic low back pain with bilateral sciatica for 3 years.  EXAM: LUMBAR SPINE  - COMPLETE WITH BENDING VIEWS  COMPARISON:  Lumbar spine MRI 10/25/2014  FINDINGS: There are 5 non rib-bearing lumbar type vertebrae. Vertebral alignment is normal. Range of motion appears extremely limited on flexion and extension radiographs. There is at most mild disc space narrowing at L3-4 and L5-S1. Mild anterior endplate spurring is noted at L3-4. Vertebral body heights are preserved without evidence of fracture. Facet arthrosis is likely moderate at L4-5 and L5-S1. The soft tissues are unremarkable.  IMPRESSION: Mild disc and moderate facet degeneration in the lumbar spine. No evidence of acute osseous abnormality.   Electronically Signed   By: Logan Bores M.D.   On: 04/17/2017 16:25    Sacroiliac Joint Imaging: Sacroiliac Joint DG:  Results for orders placed during the hospital encounter of 04/17/17  DG Si Joints   Narrative CLINICAL DATA:  Chronic low back pain and BILATERAL sciatica for 3 years, initial injury from lifting heavy objects, some relief with spine injections  EXAM: BILATERAL SACROILIAC JOINTS - 3+ VIEW  COMPARISON:  None.  FINDINGS: Osseous mineralization normal.  Hip joints and SI joints symmetric and preserved.  No sacral fracture or bone destruction.  No SI joint arthropathy changes seen.  IMPRESSION: Negative radiographs of the SI joints.   Electronically Signed   By: Lavonia Dana M.D.   On: 04/17/2017 16:19    Knee Imaging: Knee-L DG 1-2 views:  Results for orders placed during the hospital encounter of 04/17/17  DG Knee 1-2 Views Left   Narrative CLINICAL DATA:  LEFT knee pain for 1 month, no known injury  EXAM: LEFT KNEE - 1-2 VIEW  COMPARISON:  None  FINDINGS: Tricompartmental osteoarthritic changes with joint space narrowing and spur formation.  Osseous mineralization normal.  No acute fracture, dislocation, or bone destruction.  No knee joint effusion.  IMPRESSION: Tricompartmental osteoarthritic changes  LEFT knee.  No acute abnormalities.   Electronically Signed   By: Lavonia Dana M.D.   On: 04/17/2017 16:20    Complexity Note: Imaging results reviewed. Results shared with Ms. Foster, using [employer].                         Meds   Current Outpatient Medications:  .  predniSONE (DELTASONE) 20 MG tablet, Take 3 tab(s) in the morning x 3 days,  then 2 tab(s) x 3 days, followed by 1 tab x 3 days., Disp: 21 tablet, Rfl: 0  ROS  Constitutional: Denies any fever or chills Gastrointestinal: No reported hemesis, hematochezia, vomiting, or acute GI distress Musculoskeletal: Denies any acute onset joint swelling, redness, loss of ROM, or weakness Neurological: No reported episodes of acute onset apraxia, aphasia, dysarthria, agnosia, amnesia, paralysis, loss of coordination, or loss of consciousness  Allergies  Meghan Vasquez has No Known Allergies.  PFSH  Drug: Meghan Vasquez  reports that she does not use drugs. Alcohol:  reports that she does not drink alcohol. Tobacco:  reports that  has never smoked. she has never used smokeless tobacco. Medical:  has a past medical history of Chronic sciatica, Diabetes mellitus without complication ([place]), Lumbar radiculopathy, and Sciatica. Surgical: Meghan Vasquez  has a past surgical history that includes etopic pregnancy and Colonoscopy (2013). Family: family history includes Aplastic anemia in her daughter; Breast cancer in her cousin; Cancer in her daughter; Diabetes in her mother; Emphysema in her brother; Kidney disease in her daughter; Other in her daughter.  Constitutional Exam  General appearance: Well nourished, well developed, and well hydrated. In no apparent acute distress Vitals:   04/29/17 1140  BP: (!) 150/73  Pulse: 77  Resp: 16  Temp: 98.3 F (36.8 C)  SpO2: 100%  Weight: 224 lb (101.6 kg)  Height: 5' 3"  (1.6 m)   BMI Assessment: Estimated body mass index is 39.68 kg/m as calculated from the following:   Height as of this  encounter: 5' 3"  (1.6 m).   Weight as of this encounter: 224 lb (101.6 kg).  BMI interpretation table: BMI level Category Range association with higher incidence of chronic pain  <18 kg/m2 Underweight   18.5-24.9 kg/m2 Ideal body weight   25-29.9 kg/m2 Overweight Increased incidence by 20%  30-34.9 kg/m2 Obese (Class I) Increased incidence by 68%  35-39.9 kg/m2 Severe obesity (Class II) Increased incidence by 136%  >40 kg/m2 Extreme obesity (Class III) Increased incidence by 254%   BMI Readings from Last 4 Encounters:  04/29/17 39.68 kg/m  04/17/17 38.97 kg/m  03/02/17 38.97 kg/m  10/07/16 39.43 kg/m   Wt Readings from Last 4 Encounters:  04/29/17 224 lb (101.6 kg)  04/17/17 220 lb (99.8 kg)  03/02/17 220 lb (99.8 kg)  10/07/16 224 lb (101.6 kg)  Psych/Mental status: Alert, oriented x 3 (person, place, & time)       Eyes: PERLA Respiratory: No evidence of acute respiratory distress  Cervical Spine Area Exam  Skin & Axial Inspection: No masses, redness, edema, swelling, or associated skin lesions Alignment: Symmetrical Functional ROM: Unrestricted ROM      Stability: No instability detected Muscle Tone/Strength: Functionally intact. No obvious neuro-muscular anomalies detected. Sensory (Neurological): Unimpaired Palpation: No palpable anomalies              Upper Extremity (UE) Exam    Side: Right upper extremity  Side: Left upper extremity  Skin & Extremity Inspection: Skin color, temperature, and hair growth are WNL. No peripheral edema or cyanosis. No masses, redness, swelling, asymmetry, or associated skin lesions. No contractures.  Skin & Extremity Inspection: Skin color, temperature, and hair growth are WNL. No peripheral edema or cyanosis. No masses, redness, swelling, asymmetry, or associated skin lesions. No contractures.  Functional ROM: Unrestricted ROM          Functional ROM: Unrestricted ROM          Muscle Tone/Strength: Functionally intact. No obvious  neuro-muscular  anomalies detected.  Muscle Tone/Strength: Functionally intact. No obvious neuro-muscular anomalies detected.  Sensory (Neurological): Unimpaired          Sensory (Neurological): Unimpaired          Palpation: No palpable anomalies              Palpation: No palpable anomalies              Specialized Test(s): Deferred         Specialized Test(s): Deferred          Thoracic Spine Area Exam  Skin & Axial Inspection: No masses, redness, or swelling Alignment: Symmetrical Functional ROM: Unrestricted ROM Stability: No instability detected Muscle Tone/Strength: Functionally intact. No obvious neuro-muscular anomalies detected. Sensory (Neurological): Unimpaired Muscle strength & Tone: No palpable anomalies  Lumbar Spine Area Exam  Skin & Axial Inspection: No masses, redness, or swelling Alignment: Symmetrical Functional ROM: Minimal ROM      Stability: No instability detected Muscle Tone/Strength: Increased muscle tone over affected area Sensory (Neurological): Movement-associated pain Palpation: Complains of area being tender to palpation       Provocative Tests: Lumbar Hyperextension and rotation test: Positive bilaterally for facet joint pain. Lumbar Lateral bending test: evaluation deferred today       Patrick's Maneuver: evaluation deferred today                    Gait & Posture Assessment  Ambulation: Patient ambulates using a cane Gait: Modified gait pattern (slower gait speed, wider stride width, and longer stance duration) associated with morbid obesity Posture: Difficulty standing up straight, due to pain   Lower Extremity Exam    Side: Right lower extremity  Side: Left lower extremity  Skin & Extremity Inspection: Skin color, temperature, and hair growth are WNL. No peripheral edema or cyanosis. No masses, redness, swelling, asymmetry, or associated skin lesions. No contractures.  Skin & Extremity Inspection: Skin color, temperature, and hair growth are WNL.  No peripheral edema or cyanosis. No masses, redness, swelling, asymmetry, or associated skin lesions. No contractures.  Functional ROM: Unrestricted ROM          Functional ROM: Unrestricted ROM          Muscle Tone/Strength: Functionally intact. No obvious neuro-muscular anomalies detected.  Muscle Tone/Strength: Functionally intact. No obvious neuro-muscular anomalies detected.  Sensory (Neurological): Unimpaired  Sensory (Neurological): Unimpaired  Palpation: No palpable anomalies  Palpation: No palpable anomalies   Assessment & Plan  Primary Diagnosis & Pertinent Problem List: The primary encounter diagnosis was Chronic pain syndrome. Diagnoses of Chronic low back pain (Primary Area of Pain) (Bilateral) (R>L), Lumbar facet syndrome (Bilateral) (R>L), Lumbar facet osteoarthritis, Osteoarthritis of  Lumbar spine, DDD (degenerative disc disease), lumbar, Chronic lower extremity pain (Secondary Area of Pain) (Bilateral) (R>L), Chronic lumbar radicular pain, Chronic knee pain (Tertiary Area of Pain) (Left), Osteoarthritis of knee (Left), and Tricompartment osteoarthritis of knee (Left) were also pertinent to this visit.  Visit Diagnosis: 1. Chronic pain syndrome   2. Chronic low back pain (Primary Area of Pain) (Bilateral) (R>L)   3. Lumbar facet syndrome (Bilateral) (R>L)   4. Lumbar facet osteoarthritis   5. Osteoarthritis of  Lumbar spine   6. DDD (degenerative disc disease), lumbar   7. Chronic lower extremity pain (Secondary Area of Pain) (Bilateral) (R>L)   8. Chronic lumbar radicular pain   9. Chronic knee pain [facility] Area of Pain) (Left)   10. Osteoarthritis of knee (Left)   11. Tricompartment  osteoarthritis of knee (Left)    Problems updated and reviewed during this visit: Problem  Lumbar facet syndrome (Bilateral) (R>L)  Osteoarthritis of knee (Left)  Tricompartment osteoarthritis of knee (Left)  Ddd (Degenerative Disc Disease), Lumbar  Lumbar facet osteoarthritis   Osteoarthritis of  Lumbar spine  Chronic lumbar radicular pain  Chronic low back pain (Primary Area of Pain) (Bilateral) (R>L)  Chronic lower extremity pain (Secondary Area of Pain) (Bilateral) (R>L)  Chronic knee pain (Tertiary Area of Pain) (Left)  Chronic Pain Syndrome  Disorder of Bone, Unspecified  Other Specified Health Status  Other Long Term (Current) Drug Therapy  Controlled Diabetes Mellitus With Chronic Kidney Disease ([facility])  Skin Cyst  Breast Cyst  Anemia  Infertility of Tubal Origin  Perimenopause   Time Note: Greater than 50% of the 40 minute(s) of face-to-face time spent with Meghan Vasquez, was spent in counseling/coordination of care regarding: the appropriate use of the pain scale, Ms. Foster's primary cause of pain, the results of her recent test(s), the significance of each one oth the test(s) anomalies and it's corresponding characteristic pain pattern(s), the treatment plan, treatment alternatives, the risks and possible complications of proposed treatment and the need to collect and read the AVS material.  Plan of Care  Pharmacotherapy (Medications Ordered): Meds ordered this encounter  Medications  . predniSONE (DELTASONE) 20 MG tablet    Sig: Take 3 tab(s) in the morning x 3 days, then 2 tab(s) x 3 days, followed by 1 tab x 3 days.    Dispense:  21 tablet    Refill:  0    Do not add to the "Automatic Refill" notification system.   Procedure Orders    No procedure(s) ordered today   Lab Orders  No laboratory test(s) ordered today   Imaging Orders  No imaging studies ordered today   Referral Orders  No referral(s) requested today    Pharmacological management options:  Opioid Analgesics: Meghan Vasquez would like to stay away from opioids, if at all possible Membrane stabilizer: None prescribed at this time Muscle relaxant: The patient indicates that she did not like how she felt on the Flexeril since it made her very sleepy.  This happened at even the 5 mg  dose. NSAID: The patient did have a meloxicam 7.5 mg trial, but she indicates that she is no longer taking the medication.  She was not sure as to which one of the 2 was making her sleepy and therefore she stopped them both. Other analgesic(s): To be determined at a later time   Interventional management options: Planned, scheduled, and/or pending:    Steroid taper trial.  If the patient does not improve, then we will proceed with a diagnostic bilateral lumbar facet block under fluoroscopic guidance and IV sedation. The patient's last MRI was on 2016 and if she does not improve with this therapy, we will consider repeating her lumbar MRI.   Considering:   Diagnostic bilateral lumbar facet nerve block Possible bilateral lumbar facet RFA Diagnostic left-sided intra-articular knee injection Diagnostic left sided Hyalgan series Diagnostic left-sided genicular nerve block Possible left-sided genicular RFA    PRN Procedures:   None at this time   Provider-requested follow-up: Return in about 2 weeks (around 05/13/2017) for Med-Mgmt, w/ Dr. Dossie Arbour.  No future appointments.  Primary Care Physician: Donnie Coffin, MD Location: [facility] Note by: Gaspar Cola, MD Date: 04/29/2017; Time: 1:15 PM

## 2017-04-30 ENCOUNTER — Other Ambulatory Visit: Payer: Self-pay | Admitting: Neurosurgery

## 2017-04-30 DIAGNOSIS — M545 Low back pain: Secondary | ICD-10-CM

## 2017-04-30 DIAGNOSIS — G8929 Other chronic pain: Secondary | ICD-10-CM

## 2017-04-30 DIAGNOSIS — M542 Cervicalgia: Secondary | ICD-10-CM

## 2017-05-06 ENCOUNTER — Telehealth: Payer: Self-pay

## 2017-05-09 NOTE — Telephone Encounter (Signed)
JUST FYI

## 2017-05-13 ENCOUNTER — Ambulatory Visit: Payer: BLUE CROSS/BLUE SHIELD | Attending: Pain Medicine | Admitting: Pain Medicine

## 2017-05-13 ENCOUNTER — Encounter: Payer: Self-pay | Admitting: Pain Medicine

## 2017-05-13 ENCOUNTER — Other Ambulatory Visit: Payer: Self-pay

## 2017-05-13 VITALS — BP 172/89 | HR 86 | Temp 98.7°F | Resp 16 | Ht 63.0 in | Wt 224.0 lb

## 2017-05-13 DIAGNOSIS — M79604 Pain in right leg: Secondary | ICD-10-CM

## 2017-05-13 DIAGNOSIS — M1712 Unilateral primary osteoarthritis, left knee: Secondary | ICD-10-CM | POA: Diagnosis not present

## 2017-05-13 DIAGNOSIS — Z791 Long term (current) use of non-steroidal anti-inflammatories (NSAID): Secondary | ICD-10-CM | POA: Diagnosis not present

## 2017-05-13 DIAGNOSIS — G8929 Other chronic pain: Secondary | ICD-10-CM | POA: Diagnosis not present

## 2017-05-13 DIAGNOSIS — Z79891 Long term (current) use of opiate analgesic: Secondary | ICD-10-CM | POA: Insufficient documentation

## 2017-05-13 DIAGNOSIS — D649 Anemia, unspecified: Secondary | ICD-10-CM | POA: Insufficient documentation

## 2017-05-13 DIAGNOSIS — Z825 Family history of asthma and other chronic lower respiratory diseases: Secondary | ICD-10-CM | POA: Insufficient documentation

## 2017-05-13 DIAGNOSIS — E1122 Type 2 diabetes mellitus with diabetic chronic kidney disease: Secondary | ICD-10-CM | POA: Insufficient documentation

## 2017-05-13 DIAGNOSIS — N189 Chronic kidney disease, unspecified: Secondary | ICD-10-CM | POA: Diagnosis not present

## 2017-05-13 DIAGNOSIS — M47816 Spondylosis without myelopathy or radiculopathy, lumbar region: Secondary | ICD-10-CM

## 2017-05-13 DIAGNOSIS — M5441 Lumbago with sciatica, right side: Secondary | ICD-10-CM

## 2017-05-13 DIAGNOSIS — M79605 Pain in left leg: Secondary | ICD-10-CM

## 2017-05-13 DIAGNOSIS — M5116 Intervertebral disc disorders with radiculopathy, lumbar region: Secondary | ICD-10-CM | POA: Insufficient documentation

## 2017-05-13 DIAGNOSIS — Z9889 Other specified postprocedural states: Secondary | ICD-10-CM | POA: Insufficient documentation

## 2017-05-13 DIAGNOSIS — Z833 Family history of diabetes mellitus: Secondary | ICD-10-CM | POA: Diagnosis not present

## 2017-05-13 DIAGNOSIS — M5136 Other intervertebral disc degeneration, lumbar region: Secondary | ICD-10-CM

## 2017-05-13 DIAGNOSIS — Z803 Family history of malignant neoplasm of breast: Secondary | ICD-10-CM | POA: Insufficient documentation

## 2017-05-13 DIAGNOSIS — M5442 Lumbago with sciatica, left side: Secondary | ICD-10-CM | POA: Diagnosis not present

## 2017-05-13 DIAGNOSIS — M5416 Radiculopathy, lumbar region: Secondary | ICD-10-CM

## 2017-05-13 DIAGNOSIS — M25562 Pain in left knee: Secondary | ICD-10-CM | POA: Diagnosis not present

## 2017-05-13 DIAGNOSIS — Z79899 Other long term (current) drug therapy: Secondary | ICD-10-CM | POA: Diagnosis not present

## 2017-05-13 DIAGNOSIS — G894 Chronic pain syndrome: Secondary | ICD-10-CM | POA: Insufficient documentation

## 2017-05-13 MED ORDER — TRAMADOL HCL 50 MG PO TABS: 50.0000 mg | ORAL_TABLET | Freq: Four times a day (QID) | ORAL | 0 refills | Status: AC | PRN

## 2017-05-13 NOTE — Progress Notes (Signed)
Patient's Name: Meghan Vasquez  MRN: [id number]  Referring Provider: Donnie Coffin, MD  DOB: [birth year]  PCP: Donnie Coffin, MD  DOS: 05/13/2017  Note by: Gaspar Cola, MD  Service setting: Ambulatory outpatient  Specialty: Interventional Pain Management  Location: [facility] (AMB) Pain Management Facility    Patient type: Established   Primary Reason(s) for Visit: Evaluation of chronic illnesses with exacerbation, or progression (Level of risk: moderate) CC: Back Pain (lower)  HPI  [name] is a 57 y.o. year old, female patient, who comes today for a follow-up evaluation. 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[place]); Infertility of tubal origin; Perimenopause; Anemia; Skin cyst; Breast cyst; Chronic low back pain (Primary Area of Pain) (Bilateral) (R>L); Chronic lower extremity pain (Secondary Area of Pain) (Bilateral) (R>L); Chronic knee pain [facility] Area of Pain) (Left); Chronic pain syndrome; Disorder of bone, unspecified; Other specified health status; Other long term (current) drug therapy; Lumbar facet syndrome (Bilateral) (R>L); Osteoarthritis of knee (Left); Tricompartment osteoarthritis of knee (Left); DDD (degenerative disc disease), lumbar; Lumbar facet osteoarthritis; Osteoarthritis of  Lumbar spine; and Chronic lumbar radicular pain (R) (L5) on their problem list. [name] was last seen on 04/29/2017. Her primarily concern today is the Back Pain (lower)  Pain Assessment: Location: Right Back Radiating: buttocks/hips down back of leg to top foot, effects all toes Onset: More than a month ago Duration: Chronic pain Quality: Aching, Discomfort, Shooting, Cramping, Constant Severity: 7 /10 (self-reported pain score)  Note: Reported level is inconsistent with clinical observations. Clinically the patient looks like a 3/10 A 3/10 is viewed as "Moderate" and described as significantly interfering with activities of daily living (ADL). It becomes difficult to  feed, bathe, get dressed, get on and off the toilet or to perform personal hygiene functions. Difficult to get in and out of bed or a chair without assistance. Very distracting. With effort, it can be ignored when deeply involved in activities. Information on the proper use of the pain scale provided to the patient today. When using our objective Pain Scale, levels between 6 and 10/10 are said to belong in an emergency room, as it progressively worsens from a 6/10, described as severely limiting, requiring emergency care not usually available at an outpatient pain management facility. At a 6/10 level, communication becomes difficult and requires great effort. Assistance to reach the emergency department may be required. Facial flushing and profuse sweating along with potentially dangerous increases in heart rate and blood pressure will be evident. Effect on ADL: sleeping, prolonged walking, lifting, bathin, twisting and bending Timing: Constant Modifying factors: ice, elevate, heat, stretching  Further details on both, my assessment(s), as well as the proposed treatment plan, please see below.  Laboratory Chemistry  Inflammation Markers (CRP: Acute Phase) (ESR: Chronic Phase) Lab Results  Component Value Date   CRP 6.4 (H) 04/17/2017   ESRSEDRATE CANCELED 04/17/2017                 Renal Function Markers Lab Results  Component Value Date   BUN 17 04/17/2017   CREATININE 1.23 (H) 04/17/2017   GFRAA 57 (L) 04/17/2017   GFRNONAA 49 (L) 04/17/2017                 Hepatic Function Markers Lab Results  Component Value Date   AST 14 04/17/2017   ALT 18 07/01/2015   ALBUMIN 4.6 04/17/2017   ALKPHOS 96 04/17/2017  Electrolytes Lab Results  Component Value Date   NA 142 04/17/2017   K 4.4 04/17/2017   CL 101 04/17/2017   CALCIUM 10.0 04/17/2017   MG 1.9 04/17/2017                 Neuropathy Markers Lab Results  Component Value Date   MWUXLKGM01 027 04/17/2017    HIV Non Reactive 01/31/2015                 Bone Pathology Markers Lab Results  Component Value Date   25OHVITD1 39 04/17/2017   25OHVITD2 <1.0 04/17/2017   25OHVITD3 39 04/17/2017                 Coagulation Parameters Lab Results  Component Value Date   PLT 249 12/14/2015                 Cardiovascular Markers Lab Results  Component Value Date   HGB 11.8 12/14/2015   HCT 35.0 12/14/2015                 Note: Lab results reviewed.  Recent Diagnostic Imaging Review  Lumbosacral Imaging: Lumbar MR wo contrast:  Results for orders placed during the hospital encounter of 10/25/14  MR Lumbar Spine Wo Contrast   Narrative CLINICAL DATA:  57 year old female injured lifting January 2016 female with low back pain and bilateral buttock and leg pain extending to toes with spasms. Right upper leg numbness laterally. Initial encounter.  EXAM: MRI LUMBAR SPINE WITHOUT CONTRAST  TECHNIQUE: Multiplanar, multisequence MR imaging of the lumbar spine was performed. No intravenous contrast was administered.  COMPARISON:  None.  FINDINGS: Last fully open disk space is labeled L5-S1. Present examination incorporates from T11-12 disc space through the lower sacrum.  Conus L1 level.  Heterogeneous uterus suggestive of presence of fibroids. Additionally, loculated fluid appearing structure of left aspect of the uterus possibly representing hydro salpinx (versus fluid-filled small bowel). Pelvic sonogram recommended for further delineation.  T11-12: Minimal bulge.  Anterior osteophyte.  T12-L1:  Negative.  L1-2:  Negative.  L2-3:  Minimal bulge.  Minimal facet joint degenerative changes.  L3-4: Mild facet joint degenerative changes and ligamentum flavum hypertrophy.  L4-5: Moderate right-sided and mild-to-moderate left-sided facet joint degenerative changes. Ligamentum flavum hypertrophy greater on the right. Minimal bulge greater to the right.  L5-S1: Marked left-sided  and moderate right-sided facet joint degenerative changes. Minimal bulge.  IMPRESSION: Degenerative changes as noted above most notable involving facet joints without evidence of significant spinal stenosis, foraminal narrowing or nerve root compression.  Heterogeneous uterus suggestive of presence of fibroids. Additionally, loculated fluid appearing structure of left aspect of the uterus possibly representing hydro salpinx (versus fluid-filled small bowel). Pelvic sonogram recommended for further delineation.   Electronically Signed   By: Genia Del M.D.   On: 10/25/2014 14:46    Lumbar DG Bending views:  Results for orders placed during the hospital encounter of 04/17/17  DG Lumbar Spine Complete W/Bend   Narrative CLINICAL DATA:  Chronic low back pain with bilateral sciatica for 3 years.  EXAM: LUMBAR SPINE - COMPLETE WITH BENDING VIEWS  COMPARISON:  Lumbar spine MRI 10/25/2014  FINDINGS: There are 5 non rib-bearing lumbar type vertebrae. Vertebral alignment is normal. Range of motion appears extremely limited on flexion and extension radiographs. There is at most mild disc space narrowing at L3-4 and L5-S1. Mild anterior endplate spurring is noted at L3-4. Vertebral body heights are preserved without evidence of fracture. Facet  arthrosis is likely moderate at L4-5 and L5-S1. The soft tissues are unremarkable.  IMPRESSION: Mild disc and moderate facet degeneration in the lumbar spine. No evidence of acute osseous abnormality.   Electronically Signed   By: Logan Bores M.D.   On: 04/17/2017 16:25    Sacroiliac Joint Imaging: Sacroiliac Joint DG:  Results for orders placed during the hospital encounter of 04/17/17  DG Si Joints   Narrative CLINICAL DATA:  Chronic low back pain and BILATERAL sciatica for 3 years, initial injury from lifting heavy objects, some relief with spine injections  EXAM: BILATERAL SACROILIAC JOINTS - 3+ VIEW  COMPARISON:   None.  FINDINGS: Osseous mineralization normal.  Hip joints and SI joints symmetric and preserved.  No sacral fracture or bone destruction.  No SI joint arthropathy changes seen.  IMPRESSION: Negative radiographs of the SI joints.   Electronically Signed   By: Lavonia Dana M.D.   On: 04/17/2017 16:19    Knee Imaging: Knee-L DG 1-2 views:  Results for orders placed during the hospital encounter of 04/17/17  DG Knee 1-2 Views Left   Narrative CLINICAL DATA:  LEFT knee pain for 1 month, no known injury  EXAM: LEFT KNEE - 1-2 VIEW  COMPARISON:  None  FINDINGS: Tricompartmental osteoarthritic changes with joint space narrowing and spur formation.  Osseous mineralization normal.  No acute fracture, dislocation, or bone destruction.  No knee joint effusion.  IMPRESSION: Tricompartmental osteoarthritic changes LEFT knee.  No acute abnormalities.   Electronically Signed   By: Lavonia Dana M.D.   On: 04/17/2017 16:20    Complexity Note: Imaging results reviewed. Results shared with Ms. Foster, using [employer].                         Meds   Current Outpatient Medications:  .  ibuprofen (ADVIL,MOTRIN) 200 MG tablet, Take 200 mg by mouth every 6 (six) hours as needed., Disp: , Rfl:  .  traMADol (ULTRAM) 50 MG tablet, Take 1 tablet (50 mg total) by mouth every 6 (six) hours as needed for severe pain., Disp: 120 tablet, Rfl: 0  ROS  Constitutional: Denies any fever or chills Gastrointestinal: No reported hemesis, hematochezia, vomiting, or acute GI distress Musculoskeletal: Denies any acute onset joint swelling, redness, loss of ROM, or weakness Neurological: No reported episodes of acute onset apraxia, aphasia, dysarthria, agnosia, amnesia, paralysis, loss of coordination, or loss of consciousness  Allergies  [name] has No Known Allergies.  PFSH  Drug: [name]  reports that she does not use drugs. Alcohol:  reports that she does not drink  alcohol. Tobacco:  reports that  has never smoked. she has never used smokeless tobacco. Medical:  has a past medical history of Chronic sciatica, Diabetes mellitus without complication ([place]), Lumbar radiculopathy, and Sciatica. Surgical: [name]  has a past surgical history that includes etopic pregnancy and Colonoscopy (2013). Family: family history includes Aplastic anemia in her daughter; Breast cancer in her cousin; Cancer in her daughter; Diabetes in her mother; Emphysema in her brother; Kidney disease in her daughter; Other in her daughter.  Constitutional Exam  General appearance: Well nourished, well developed, and well hydrated. In no apparent acute distress Vitals:   05/13/17 1101  BP: (!) 172/89  Pulse: 86  Resp: 16  Temp: 98.7 F (37.1 C)  SpO2: 100%  Weight: 224 lb (101.6 kg)  Height: 5' 3"  (1.6 m)   BMI Assessment: Estimated body mass index  is 39.68 kg/m as calculated from the following:   Height as of this encounter: 5' 3"  (1.6 m).   Weight as of this encounter: 224 lb (101.6 kg).  BMI interpretation table: BMI level Category Range association with higher incidence of chronic pain  <18 kg/m2 Underweight   18.5-24.9 kg/m2 Ideal body weight   25-29.9 kg/m2 Overweight Increased incidence by 20%  30-34.9 kg/m2 Obese (Class I) Increased incidence by 68%  35-39.9 kg/m2 Severe obesity (Class II) Increased incidence by 136%  >40 kg/m2 Extreme obesity (Class III) Increased incidence by 254%   BMI Readings from Last 4 Encounters:  05/13/17 39.68 kg/m  04/29/17 39.68 kg/m  04/17/17 38.97 kg/m  03/02/17 38.97 kg/m   Wt Readings from Last 4 Encounters:  05/13/17 224 lb (101.6 kg)  04/29/17 224 lb (101.6 kg)  04/17/17 220 lb (99.8 kg)  03/02/17 220 lb (99.8 kg)  Psych/Mental status: Alert, oriented x 3 (person, place, & time)       Eyes: PERLA Respiratory: No evidence of acute respiratory distress  Cervical Spine Area Exam  Skin & Axial Inspection: No masses,  redness, edema, swelling, or associated skin lesions Alignment: Symmetrical Functional ROM: Unrestricted ROM      Stability: No instability detected Muscle Tone/Strength: Functionally intact. No obvious neuro-muscular anomalies detected. Sensory (Neurological): Unimpaired Palpation: No palpable anomalies              Upper Extremity (UE) Exam    Side: Right upper extremity  Side: Left upper extremity  Skin & Extremity Inspection: Skin color, temperature, and hair growth are WNL. No peripheral edema or cyanosis. No masses, redness, swelling, asymmetry, or associated skin lesions. No contractures.  Skin & Extremity Inspection: Skin color, temperature, and hair growth are WNL. No peripheral edema or cyanosis. No masses, redness, swelling, asymmetry, or associated skin lesions. No contractures.  Functional ROM: Unrestricted ROM          Functional ROM: Unrestricted ROM          Muscle Tone/Strength: Functionally intact. No obvious neuro-muscular anomalies detected.  Muscle Tone/Strength: Functionally intact. No obvious neuro-muscular anomalies detected.  Sensory (Neurological): Unimpaired          Sensory (Neurological): Unimpaired          Palpation: No palpable anomalies              Palpation: No palpable anomalies              Specialized Test(s): Deferred         Specialized Test(s): Deferred          Thoracic Spine Area Exam  Skin & Axial Inspection: No masses, redness, or swelling Alignment: Symmetrical Functional ROM: Unrestricted ROM Stability: No instability detected Muscle Tone/Strength: Functionally intact. No obvious neuro-muscular anomalies detected. Sensory (Neurological): Unimpaired Muscle strength & Tone: No palpable anomalies  Lumbar Spine Area Exam  Skin & Axial Inspection: No masses, redness, or swelling Alignment: Symmetrical Functional ROM: Unrestricted ROM      Stability: No instability detected Muscle Tone/Strength: Functionally intact. No obvious neuro-muscular  anomalies detected. Sensory (Neurological): Unimpaired Palpation: No palpable anomalies       Provocative Tests: Lumbar Hyperextension and rotation test: evaluation deferred today       Lumbar Lateral bending test: evaluation deferred today       Patrick's Maneuver: evaluation deferred today                    Gait & Posture Assessment  Ambulation:  Unassisted Gait: Relatively normal for age and body habitus Posture: WNL   Lower Extremity Exam    Side: Right lower extremity  Side: Left lower extremity  Skin & Extremity Inspection: Skin color, temperature, and hair growth are WNL. No peripheral edema or cyanosis. No masses, redness, swelling, asymmetry, or associated skin lesions. No contractures.  Skin & Extremity Inspection: Skin color, temperature, and hair growth are WNL. No peripheral edema or cyanosis. No masses, redness, swelling, asymmetry, or associated skin lesions. No contractures.  Functional ROM: Unrestricted ROM          Functional ROM: Unrestricted ROM          Muscle Tone/Strength: Functionally intact. No obvious neuro-muscular anomalies detected.  Muscle Tone/Strength: Functionally intact. No obvious neuro-muscular anomalies detected.  Sensory (Neurological): Unimpaired  Sensory (Neurological): Unimpaired  Palpation: No palpable anomalies  Palpation: No palpable anomalies   Assessment  Primary Diagnosis & Pertinent Problem List: The primary encounter diagnosis was Chronic low back pain (Primary Area of Pain) (Bilateral) (R>L). Diagnoses of Lumbar facet syndrome (Bilateral) (R>L), Lumbar facet osteoarthritis, Chronic lower extremity pain (Secondary Area of Pain) (Bilateral) (R>L), Chronic knee pain (Tertiary Area of Pain) (Left), Osteoarthritis of knee (Left), Tricompartment osteoarthritis of knee (Left), Chronic lumbar radicular pain (R) (L5), DDD (degenerative disc disease), lumbar, and Chronic pain syndrome were also pertinent to this visit.  Status Diagnosis   Controlled Controlled Controlled 1. Chronic low back pain (Primary Area of Pain) (Bilateral) (R>L)   2. Lumbar facet syndrome (Bilateral) (R>L)   3. Lumbar facet osteoarthritis   4. Chronic lower extremity pain (Secondary Area of Pain) (Bilateral) (R>L)   5. Chronic knee pain [facility] Area of Pain) (Left)   6. Osteoarthritis of knee (Left)   7. Tricompartment osteoarthritis of knee (Left)   8. Chronic lumbar radicular pain (R) (L5)   9. DDD (degenerative disc disease), lumbar   10. Chronic pain syndrome     Problems updated and reviewed during this visit: Problem  Chronic lumbar radicular pain (R) (L5)   Plan of Care  Pharmacotherapy (Medications Ordered): Meds ordered this encounter  Medications  . traMADol (ULTRAM) 50 MG tablet    Sig: Take 1 tablet (50 mg total) by mouth every 6 (six) hours as needed for severe pain.    Dispense:  120 tablet    Refill:  0    Do not place this medication, or any other prescription from our practice, on "Automatic Refill". Patient may have prescription filled one day early if pharmacy is closed on scheduled refill date. Do not fill until: 05/13/17 To last until: 06/12/17   Medications administered today: [name] had no medications administered during this visit.   Procedure Orders     Lumbar Epidural Injection Lab Orders  No laboratory test(s) ordered today   Imaging Orders  No imaging studies ordered today   Referral Orders  No referral(s) requested today    Interventional management options: Planned, scheduled, and/or pending:   Diagnostic bilateral lumbar facet block under fluoroscopic guidance and IV sedation. Pending results of Lumbar MRI. Scheduled for tomorrow. Last MRI was on 2016.   Considering:   Diagnostic bilaterallumbar facet nerve block Possible bilateral lumbar facet RFA Diagnostic left-sided intra-articular knee injection Diagnostic left sided Hyalgan series Diagnostic left-sided genicular nerve  block Possible left-sided genicular RFA   Palliative PRN treatment(s):   None at this time   Provider-requested follow-up: Return for Procedure (w/ sedation): (R) L4-5 LESI.  Future Appointments  Date Time Provider Department  Center  05/14/2017  1:15 PM [facility]  05/14/2017  2:00 PM [facility]   Primary Care Physician: Donnie Coffin, MD Location: [facility] Note by: Gaspar Cola, MD Date: 05/13/2017; Time: 3:28 PM

## 2017-05-13 NOTE — Patient Instructions (Addendum)
____________________________________________________________________________________________  Pain Scale  Introduction: The pain score used by this practice is the Verbal Numerical Rating Scale (VNRS-11). This is an 11-point scale. It is for adults and children 10 years or older. There are significant differences in how the pain score is reported, used, and applied. Forget everything you learned in the past and learn this scoring system.  General Information: The scale should reflect your current level of pain. Unless you are specifically asked for the level of your worst pain, or your average pain. If you are asked for one of these two, then it should be understood that it is over the past 24 hours.  Basic Activities of Daily Living (ADL): Personal hygiene, dressing, eating, transferring, and using restroom.  Instructions: Most patients tend to report their level of pain as a combination of two factors, their physical pain and their psychosocial pain. This last one is also known as "suffering" and it is reflection of how physical pain affects you socially and psychologically. From now on, report them separately. From this point on, when asked to report your pain level, report only your physical pain. Use the following table for reference.  Pain Clinic Pain Levels (0-5/10)  Pain Level Score  Description  No Pain 0   Mild pain 1 Nagging, annoying, but does not interfere with basic activities of daily living (ADL). Patients are able to eat, bathe, get dressed, toileting (being able to get on and off the toilet and perform personal hygiene functions), transfer (move in and out of bed or a chair without assistance), and maintain continence (able to control bladder and bowel functions). Blood pressure and heart rate are unaffected. A normal heart rate for a healthy adult ranges from 60 to 100 bpm (beats per minute).   Mild to moderate pain 2 Noticeable and distracting. Impossible to hide from other  people. More frequent flare-ups. Still possible to adapt and function close to normal. It can be very annoying and may have occasional stronger flare-ups. With discipline, patients may get used to it and adapt.   Moderate pain 3 Interferes significantly with activities of daily living (ADL). It becomes difficult to feed, bathe, get dressed, get on and off the toilet or to perform personal hygiene functions. Difficult to get in and out of bed or a chair without assistance. Very distracting. With effort, it can be ignored when deeply involved in activities.   Moderately severe pain 4 Impossible to ignore for more than a few minutes. With effort, patients may still be able to manage work or participate in some social activities. Very difficult to concentrate. Signs of autonomic nervous system discharge are evident: dilated pupils (mydriasis); mild sweating (diaphoresis); sleep interference. Heart rate becomes elevated (>115 bpm). Diastolic blood pressure (lower number) rises above 100 mmHg. Patients find relief in laying down and not moving.   Severe pain 5 Intense and extremely unpleasant. Associated with frowning face and frequent crying. Pain overwhelms the senses.  Ability to do any activity or maintain social relationships becomes significantly limited. Conversation becomes difficult. Pacing back and forth is common, as getting into a comfortable position is nearly impossible. Pain wakes you up from deep sleep. Physical signs will be obvious: pupillary dilation; increased sweating; goosebumps; brisk reflexes; cold, clammy hands and feet; nausea, vomiting or dry heaves; loss of appetite; significant sleep disturbance with inability to fall asleep or to remain asleep. When persistent, significant weight loss is observed due to the complete loss of appetite and sleep deprivation.  Blood   pressure and heart rate becomes significantly elevated. Caution: If elevated blood pressure triggers a pounding headache  associated with blurred vision, then the patient should immediately seek attention at an urgent or emergency care unit, as these may be signs of an impending stroke.    Emergency Department Pain Levels (6-10/10)  Emergency Room Pain 6 Severely limiting. Requires emergency care and should not be seen or managed at an outpatient pain management facility. Communication becomes difficult and requires great effort. Assistance to reach the emergency department may be required. Facial flushing and profuse sweating along with potentially dangerous increases in heart rate and blood pressure will be evident.   Distressing pain 7 Self-care is very difficult. Assistance is required to transport, or use restroom. Assistance to reach the emergency department will be required. Tasks requiring coordination, such as bathing and getting dressed become very difficult.   Disabling pain 8 Self-care is no longer possible. At this level, pain is disabling. The individual is unable to do even the most "basic" activities such as walking, eating, bathing, dressing, transferring to a bed, or toileting. Fine motor skills are lost. It is difficult to think clearly.   Incapacitating pain 9 Pain becomes incapacitating. Thought processing is no longer possible. Difficult to remember your own name. Control of movement and coordination are lost.   The worst pain imaginable 10 At this level, most patients pass out from pain. When this level is reached, collapse of the autonomic nervous system occurs, leading to a sudden drop in blood pressure and heart rate. This in turn results in a temporary and dramatic drop in blood flow to the brain, leading to a loss of consciousness. Fainting is one of the body's self defense mechanisms. Passing out puts the brain in a calmed state and causes it to shut down for a while, in order to begin the healing process.    Summary: 1. Refer to this scale when providing [place] with your pain level. 2. Be  accurate and careful when reporting your pain level. This will help with your care. 3. Over-reporting your pain level will lead to loss of credibility. 4. Even a level of 1/10 means that there is pain and will be treated at our facility. 5. High, inaccurate reporting will be documented as "Symptom Exaggeration", leading to loss of credibility and suspicions of possible secondary gains such as obtaining more narcotics, or wanting to appear disabled, for fraudulent reasons. 6. Only pain levels of 5 or below will be seen at our facility. 7. Pain levels of 6 and above will be sent to the Emergency Department and the appointment cancelled. ____________________________________________________________________________________________    ____________________________________________________________________________________________  Medication Rules  Applies to: All patients receiving prescriptions (written or electronic).  Pharmacy of record: Pharmacy where electronic prescriptions will be sent. If written prescriptions are taken to a different pharmacy, please inform the nursing staff. The pharmacy listed in the electronic medical record should be the one where you would like electronic prescriptions to be sent.  Prescription refills: Only during scheduled appointments. Applies to both, written and electronic prescriptions.  NOTE: The following applies primarily to controlled substances (Opioid* Pain Medications).   Patient's responsibilities: 1. Pain Pills: Bring all pain pills to every appointment (except for procedure appointments). 2. Pill Bottles: Bring pills in original pharmacy bottle. Always bring newest bottle. Bring bottle, even if empty. 3. Medication refills: You are responsible for knowing and keeping track of what medications you need refilled. The day before your appointment, write a list of  all prescriptions that need to be refilled. Bring that list to your appointment and give it to  the admitting nurse. Prescriptions will be written only during appointments. If you forget a medication, it will not be "Called in", "Faxed", or "electronically sent". You will need to get another appointment to get these prescribed. 4. Prescription Accuracy: You are responsible for carefully inspecting your prescriptions before leaving our office. Have the discharge nurse carefully go over each prescription with you, before taking them home. Make sure that your name is accurately spelled, that your address is correct. Check the name and dose of your medication to make sure it is accurate. Check the number of pills, and the written instructions to make sure they are clear and accurate. Make sure that you are given enough medication to last until your next medication refill appointment. 5. Taking Medication: Take medication as prescribed. Never take more pills than instructed. Never take medication more frequently than prescribed. Taking less pills or less frequently is permitted and encouraged, when it comes to controlled substances (written prescriptions).  6. Inform other Doctors: Always inform, all of your healthcare providers, of all the medications you take. 7. Pain Medication from other Providers: You are not allowed to accept any additional pain medication from any other Doctor or Healthcare provider. There are two exceptions to this rule. (see below) In the event that you require additional pain medication, you are responsible for notifying us, as stated below. 8. Medication Agreement: You are responsible for carefully reading and following our Medication Agreement. This must be signed before receiving any prescriptions from our practice. Safely store a copy of your signed Agreement. Violations to the Agreement will result in no further prescriptions. (Additional copies of our Medication Agreement are available upon request.) 9. Laws, Rules, & Regulations: All patients are expected to follow all  Federal and [employer], [employer], Rules, [employer]. Ignorance of the Laws does not constitute a valid excuse. The use of any illegal substances is prohibited. 10. Adopted CDC guidelines & recommendations: Target dosing levels will be at or below 60 MME/day. Use of benzodiazepines** is not recommended.  Exceptions: There are only two exceptions to the rule of not receiving pain medications from other Healthcare Providers. 1. Exception #1 (Emergencies): In the event of an emergency (i.e.: accident requiring emergency care), you are allowed to receive additional pain medication. However, you are responsible for: As soon as you are able, call our office [phone], at any time of the day or night, and leave a message stating your name, the date and nature of the emergency, and the name and dose of the medication prescribed. In the event that your call is answered by a member of our staff, make sure to document and save the date, time, and the name of the person that took your information.  2. Exception #2 (Planned Surgery): In the event that you are scheduled by another doctor or dentist to have any type of surgery or procedure, you are allowed (for a period no longer than 30 days), to receive additional pain medication, for the acute post-op pain. However, in this case, you are responsible for picking up a copy of our "Post-op Pain Management for Surgeons" handout, and giving it to your surgeon or dentist. This document is available at our office, and does not require an appointment to obtain it. Simply go to our office during business hours (Monday-Thursday from 8:00 AM to 4:00 PM) (Friday 8:00 AM to 12:00 Noon) or if  you have a scheduled appointment with [place], prior to your surgery, and ask for it by name. In addition, you will need to provide [place] with your name, name of your surgeon, type of surgery, and date of procedure or surgery.  *Opioid medications include: morphine, codeine, oxycodone,  oxymorphone, hydrocodone, hydromorphone, meperidine, tramadol, tapentadol, buprenorphine, fentanyl, methadone. **Benzodiazepine medications include: diazepam (Valium), alprazolam (Xanax), clonazepam (Klonopine), lorazepam (Ativan), clorazepate (Tranxene), chlordiazepoxide (Librium), estazolam (Prosom), oxazepam (Serax), temazepam (Restoril), triazolam (Halcion)  ____________________________________________________________________________________________  Epidural Steroid Injection An epidural steroid injection is a shot of steroid medicine and numbing medicine that is given into the space between the spinal cord and the bones in your back (epidural space). The shot helps relieve pain caused by an irritated or swollen nerve root. The amount of pain relief you get from the injection depends on what is causing the nerve to be swollen and irritated, and how long your pain lasts. You are more likely to benefit from this injection if your pain is strong and comes on suddenly rather than if you have had pain for a long time. Tell a health care provider about:  Any allergies you have.  All medicines you are taking, including vitamins, herbs, eye drops, creams, and over-the-counter medicines.  Any problems you or family members have had with anesthetic medicines.  Any blood disorders you have.  Any surgeries you have had.  Any medical conditions you have.  Whether you are pregnant or may be pregnant. What are the risks? Generally, this is a safe procedure. However, problems may occur, including:  Headache.  Bleeding.  Infection.  Allergic reaction to medicines.  Damage to your nerves.  What happens before the procedure? Staying hydrated Follow instructions from your health care provider about hydration, which may include:  Up to 2 hours before the procedure - you may continue to drink clear liquids, such as water, clear fruit juice, black coffee, and plain tea.  Eating and drinking  restrictions Follow instructions from your health care provider about eating and drinking, which may include:  8 hours before the procedure - stop eating heavy meals or foods such as meat, fried foods, or fatty foods.  6 hours before the procedure - stop eating light meals or foods, such as toast or cereal.  6 hours before the procedure - stop drinking milk or drinks that contain milk.  2 hours before the procedure - stop drinking clear liquids.  Medicine  You may be given medicines to lower anxiety.  Ask your health care provider about: ? Changing or stopping your regular medicines. This is especially important if you are taking diabetes medicines or blood thinners. ? Taking medicines such as aspirin and ibuprofen. These medicines can thin your blood. Do not take these medicines before your procedure if your health care provider instructs you not to. General instructions  Plan to have someone take you home from the hospital or clinic. What happens during the procedure?  You may receive a medicine to help you relax (sedative).  You will be asked to lie on your abdomen.  The injection site will be cleaned.  A numbing medicine (local anesthetic) will be used to numb the injection site.  A needle will be inserted through your skin into the epidural space. You may feel some discomfort when this happens. An X-ray machine will be used to make sure the needle is put as close as possible to the affected nerve.  A steroid medicine and a local anesthetic will be injected  into the epidural space.  The needle will be removed.  A bandage (dressing) will be put over the injection site. What happens after the procedure?  Your blood pressure, heart rate, breathing rate, and blood oxygen level will be monitored until the medicines you were given have worn off.  Your arm or leg may feel weak or numb for a few hours.  The injection site may feel sore.  Do not drive for 24 hours if you  received a sedative. This information is not intended to replace advice given to you by your health care provider. Make sure you discuss any questions you have with your health care provider. Document Released: 07/10/2007 Document Revised: 09/14/2015 Document Reviewed: 07/19/2015 Elsevier Interactive Patient Education  [employer].

## 2017-05-13 NOTE — Progress Notes (Signed)
Safety precautions to be maintained throughout the outpatient stay will include: orient to surroundings, keep bed in low position, maintain call bell within reach at all times, provide assistance with transfer out of bed and ambulation.  

## 2017-05-14 ENCOUNTER — Ambulatory Visit
Admission: RE | Admit: 2017-05-14 | Discharge: 2017-05-14 | Disposition: A | Discharge status: Home / Self Care | Payer: BLUE CROSS/BLUE SHIELD | Source: Ambulatory Visit | Attending: Neurosurgery | Admitting: Neurosurgery

## 2017-05-14 DIAGNOSIS — M542 Cervicalgia: Secondary | ICD-10-CM

## 2017-05-14 DIAGNOSIS — M4696 Unspecified inflammatory spondylopathy, lumbar region: Secondary | ICD-10-CM | POA: Insufficient documentation

## 2017-05-14 DIAGNOSIS — G8929 Other chronic pain: Secondary | ICD-10-CM | POA: Diagnosis not present

## 2017-05-14 DIAGNOSIS — M545 Low back pain: Secondary | ICD-10-CM | POA: Diagnosis present

## 2017-05-14 DIAGNOSIS — R200 Anesthesia of skin: Secondary | ICD-10-CM | POA: Diagnosis present

## 2017-05-23 ENCOUNTER — Ambulatory Visit: Payer: Medicare HMO | Admitting: Pain Medicine

## 2017-05-23 DIAGNOSIS — M47816 Spondylosis without myelopathy or radiculopathy, lumbar region: Secondary | ICD-10-CM | POA: Insufficient documentation

## 2017-05-23 NOTE — Progress Notes (Deleted)
Patient's Name: Meghan Vasquez  MRN: [id number]  Referring Provider: Donnie Coffin, MD  DOB: [birth year]  PCP: Donnie Coffin, MD  DOS: 05/23/2017  Note by: Gaspar Cola, MD  Service setting: Ambulatory outpatient  Specialty: Interventional Pain Management  Patient type: Established  Location: [facility] (AMB) Pain Management Facility  Visit type: Interventional Procedure   Primary Reason for Visit: Interventional Pain Management Treatment. CC: No chief complaint on file.  Procedure:       Anesthesia, Analgesia, Anxiolysis:  Type: Medial Branch Facet Block  #1  Primary Purpose: Diagnostic Region: Lumbar Level:  L2, L3, L4, L5, & S1 Medial Branch Level(s) Target Area: For Lumbar Facet blocks, the target is the groove formed by the junction of the transverse process and superior articular process. For the L5 dorsal ramus, the target is the notch between superior articular process and sacral ala. For the S1 dorsal ramus, the target is the superior and lateral edge of the posterior S1 Sacral foramen. Approach: Posterior, paramedial, percutaneous approach. Laterality: Bilateral Position: Prone  Type: Local Anesthesia with Moderate (Conscious) Sedation Local Anesthetic: Lidocaine 1% Route: Intravenous (IV) IV Access: Secured Sedation: Meaningful verbal contact was maintained at all times during the procedure  Indication(s): Analgesia and Anxiety   Indications: 1. Lumbar facet syndrome (Bilateral) (R>L)   2. Lumbar facet osteoarthritis   3. Chronic low back pain (Primary Area of Pain) (Bilateral) (R>L)   4. Lumbar facet hypertrophy (Multilevel) (Bilateral)    Pain Score: Pre-procedure:  /10 Post-procedure:  /10  Pre-op Assessment:  [name] is a 57 y.o. (year old), female patient, seen today for interventional treatment. She  has a past surgical history that includes etopic pregnancy and Colonoscopy (2013). [name] has a current medication list which includes the following  prescription(s): ibuprofen and tramadol. Her primarily concern today is the No chief complaint on file.  Initial Vital Signs:  Pulse Rate:   Temp:   Resp:   BP:   SpO2:    BMI: Estimated body mass index is 39.68 kg/m as calculated from the following:   Height as of 05/13/17: 5\' 3"  (1.6 m).   Weight as of 05/13/17: 224 lb (101.6 kg).  Risk Assessment: Allergies: Reviewed. She has No Known Allergies.  Allergy Precautions: None required Coagulopathies: Reviewed. None identified.  Blood-thinner therapy: None at this time Active Infection(s): Reviewed. None identified. [name] is afebrile  Site Confirmation: [name] was asked to confirm the procedure and laterality before marking the site Procedure checklist: Completed Consent: Before the procedure and under the influence of no sedative(s), amnesic(s), or anxiolytics, the patient was informed of the treatment options, risks and possible complications. To fulfill our ethical and legal obligations, as recommended by the American Medical Association's Code of Ethics, I have informed the patient of my clinical impression; the nature and purpose of the treatment or procedure; the risks, benefits, and possible complications of the intervention; the alternatives, including doing nothing; the risk(s) and benefit(s) of the alternative treatment(s) or procedure(s); and the risk(s) and benefit(s) of doing nothing. The patient was provided information about the general risks and possible complications associated with the procedure. These may include, but are not limited to: failure to achieve desired goals, infection, bleeding, organ or nerve damage, allergic reactions, paralysis, and death. In addition, the patient was informed of those risks and complications associated to Spine-related procedures, such as failure to decrease pain; infection (i.e.: Meningitis, epidural or intraspinal abscess); bleeding (i.e.: epidural hematoma, subarachnoid hemorrhage,  or  any other type of intraspinal or peri-dural bleeding); organ or nerve damage (i.e.: Any type of peripheral nerve, nerve root, or spinal cord injury) with subsequent damage to sensory, motor, and/or autonomic systems, resulting in permanent pain, numbness, and/or weakness of one or several areas of the body; allergic reactions; (i.e.: anaphylactic reaction); and/or death. Furthermore, the patient was informed of those risks and complications associated with the medications. These include, but are not limited to: allergic reactions (i.e.: anaphylactic or anaphylactoid reaction(s)); adrenal axis suppression; blood sugar elevation that in diabetics may result in ketoacidosis or comma; water retention that in patients with history of congestive heart failure may result in shortness of breath, pulmonary edema, and decompensation with resultant heart failure; weight gain; swelling or edema; medication-induced neural toxicity; particulate matter embolism and blood vessel occlusion with resultant organ, and/or nervous system infarction; and/or aseptic necrosis of one or more joints. Finally, the patient was informed that Medicine is not an exact science; therefore, there is also the possibility of unforeseen or unpredictable risks and/or possible complications that may result in a catastrophic outcome. The patient indicated having understood very clearly. We have given the patient no guarantees and we have made no promises. Enough time was given to the patient to ask questions, all of which were answered to the patient's satisfaction. [name] has indicated that she wanted to continue with the procedure. Attestation: I, the ordering provider, attest that I have discussed with the patient the benefits, risks, side-effects, alternatives, likelihood of achieving goals, and potential problems during recovery for the procedure that I have provided informed consent. Date: 05/23/2017  Time: N/A  Pre-Procedure Preparation:   Monitoring: As per clinic protocol. Respiration, ETCO2, SpO2, BP, heart rate and rhythm monitor placed and checked for adequate function Safety Precautions: Patient was assessed for positional comfort and pressure points before starting the procedure. Time-out: I initiated and conducted the "Time-out" before starting the procedure, as per protocol. The patient was asked to participate by confirming the accuracy of the "Time Out" information. Verification of the correct person, site, and procedure were performed and confirmed by me, the nursing staff, and the patient. "Time-out" conducted as per Joint Commission's Universal Protocol (UP.01.01.01). "Time-out" Date & Time: 05/23/2017;   hrs.  Description of Procedure:       Area Prepped: Entire Posterior Lumbosacral Region Prepping solution: ChloraPrep (2% chlorhexidine gluconate and 70% isopropyl alcohol) Safety Precautions: Aspiration looking for blood return was conducted prior to all injections. At no point did we inject any substances, as a needle was being advanced. No attempts were made at seeking any paresthesias. Safe injection practices and needle disposal techniques used. Medications properly checked for expiration dates. SDV (single dose vial) medications used. Description of the Procedure: Protocol guidelines were followed. The patient was placed in position over the fluoroscopy table. The target area was identified and the area prepped in the usual manner. Skin desensitized using vapocoolant spray. Skin & deeper tissues infiltrated with local anesthetic. Appropriate amount of time allowed to pass for local anesthetics to take effect. The procedure needle was introduced through the skin, ipsilateral to the reported pain, and advanced to the target area. Employing the "Medial Branch Technique", the needles were advanced to the angle made by the superior and medial portion of the transverse process, and the lateral and inferior portion of the  superior articulating process of the targeted vertebral bodies. This area is known as "Burton's Eye" or the "Eye of the [place] Dog". A procedure needle was introduced  through the skin, and this time advanced to the angle made by the superior and medial border of the sacral ala, and the lateral border of the S1 vertebral body. This last needle was later repositioned at the superior and lateral border of the posterior S1 foramen. Negative aspiration confirmed. Solution injected in intermittent fashion, asking for systemic symptoms every 0.5cc of injectate. The needles were then removed and the area cleansed, making sure to leave some of the prepping solution back to take advantage of its long term bactericidal properties.   Illustration of the posterior view of the lumbar spine and the posterior neural structures. Laminae of L2 through S1 are labeled. DPRL5, dorsal primary ramus of L5; DPRS1, dorsal primary ramus of S1; DPR3, dorsal primary ramus of L3; FJ, facet (zygapophyseal) joint L3-L4; I, inferior articular process of L4; LB1, lateral branch of dorsal primary ramus of L1; IAB, inferior articular branches from L3 medial branch (supplies L4-L5 facet joint); IBP, intermediate branch plexus; MB3, medial branch of dorsal primary ramus of L3; NR3, third lumbar nerve root; S, superior articular process of L5; SAB, superior articular branches from L4 (supplies L4-5 facet joint also); TP3, transverse process of L3.  There were no vitals filed for this visit.  Start Time:   hrs. End Time:   hrs. Materials:  Needle(s) Type: Regular needle Gauge: 22G Length: 3.5-in Medication(s): Please see orders for medications and dosing details.  Imaging Guidance (Spinal):  Type of Imaging Technique: Fluoroscopy Guidance (Spinal) Indication(s): Assistance in needle guidance and placement for procedures requiring needle placement in or near specific anatomical locations not easily accessible without such  assistance. Exposure Time: Please see nurses notes. Contrast: None used. Fluoroscopic Guidance: I was personally present during the use of fluoroscopy. "Tunnel Vision Technique" used to obtain the best possible view of the target area. Parallax error corrected before commencing the procedure. "Direction-depth-direction" technique used to introduce the needle under continuous pulsed fluoroscopy. Once target was reached, antero-posterior, oblique, and lateral fluoroscopic projection used confirm needle placement in all planes. Images permanently stored in EMR. Interpretation: No contrast injected. I personally interpreted the imaging intraoperatively. Adequate needle placement confirmed in multiple planes. Permanent images saved into the patient's record.  Antibiotic Prophylaxis:   Anti-infectives (From admission, onward)   None     Indication(s): None identified  Post-operative Assessment:  Post-procedure Vital Signs:  Pulse Rate:   Temp:   Resp:   BP:   SpO2:    EBL: None  Complications: No immediate post-treatment complications observed by team, or reported by patient.  Note: The patient tolerated the entire procedure well. A repeat set of vitals were taken after the procedure and the patient was kept under observation following institutional policy, for this type of procedure. Post-procedural neurological assessment was performed, showing return to baseline, prior to discharge. The patient was provided with post-procedure discharge instructions, including a section on how to identify potential problems. Should any problems arise concerning this procedure, the patient was given instructions to immediately contact us, at any time, without hesitation. In any case, we plan to contact the patient by telephone for a follow-up status report regarding this interventional procedure.  Comments:  No additional relevant information.  Plan of Care   Imaging Orders  No imaging studies ordered  today   Procedure Orders    No procedure(s) ordered today    Medications ordered for procedure: No orders of the defined types were placed in this encounter.  Medications administered: [name] had no medications  administered during this visit.  See the medical record for exact dosing, route, and time of administration.  New Prescriptions   No medications on file   Disposition: Discharge home  Discharge Date & Time: 05/23/2017;   hrs.   Physician-requested Follow-up: No Follow-up on file.  Future Appointments  Date Time Provider [place]  05/23/2017 12:45 PM Milinda Pointer, MD [facility] None   Primary Care Physician: Donnie Coffin, MD Location: [facility] Note by: Gaspar Cola, MD Date: 05/23/2017; Time: 9:52 AM  Disclaimer:  Medicine is not an Chief Strategy Officer. The only guarantee in medicine is that nothing is guaranteed. It is important to note that the decision to proceed with this intervention was based on the information collected from the patient. The Data and conclusions were drawn from the patient's questionnaire, the interview, and the physical examination. Because the information was provided in large part by the patient, it cannot be guaranteed that it has not been purposely or unconsciously manipulated. Every effort has been made to obtain as much relevant data as possible for this evaluation. It is important to note that the conclusions that lead to this procedure are derived in large part from the available data. Always take into account that the treatment will also be dependent on availability of resources and existing treatment guidelines, considered by other Pain Management Practitioners as being common knowledge and practice, at the time of the intervention. For Medico-Legal purposes, it is also important to point out that variation in procedural techniques and pharmacological choices are the acceptable norm. The  indications, contraindications, technique, and results of the above procedure should only be interpreted and judged by a Board-Certified Interventional Pain Specialist with extensive familiarity and expertise in the same exact procedure and technique.

## 2017-05-27 ENCOUNTER — Other Ambulatory Visit: Payer: Self-pay | Admitting: Pain Medicine

## 2017-05-27 DIAGNOSIS — M47816 Spondylosis without myelopathy or radiculopathy, lumbar region: Secondary | ICD-10-CM

## 2017-05-27 DIAGNOSIS — G8929 Other chronic pain: Secondary | ICD-10-CM

## 2017-05-27 DIAGNOSIS — M5442 Lumbago with sciatica, left side: Principal | ICD-10-CM

## 2017-05-27 DIAGNOSIS — M5441 Lumbago with sciatica, right side: Principal | ICD-10-CM

## 2017-05-27 NOTE — Progress Notes (Signed)
The results of the patient's MRI indicate: "Progressive multilevel facet hypertrophy in the lumbar spine without focal stenosis".  Since the patient's primary pain is in the lower back, we will go ahead and schedule her to come in for a diagnostic bilateral lumbar facet block under fluoroscopic guidance and IV sedation.

## 2017-06-03 DIAGNOSIS — M47816 Spondylosis without myelopathy or radiculopathy, lumbar region: Secondary | ICD-10-CM | POA: Insufficient documentation

## 2017-06-03 NOTE — Progress Notes (Signed)
Patient's Name: Meghan Vasquez  MRN: [id number]  Referring Provider: Milinda Pointer, MD  DOB: 1960/07/29  PCP: Donnie Coffin, MD  DOS: 06/04/2017  Note by: Gaspar Cola, MD  Service setting: Ambulatory outpatient  Specialty: Interventional Pain Management  Patient type: Established  Location: [facility] (AMB) Pain Management Facility  Visit type: Interventional Procedure   Primary Reason for Visit: Interventional Pain Management Treatment. CC: Back Pain (right, lower)  Procedure:       Anesthesia, Analgesia, Anxiolysis:  Lumbar Facet Medial Branch Block #1  Primary Purpose: Diagnostic Region: Lumbar Laterality: Right side  Type: Local Anesthesia with Moderate (Conscious) Sedation Local Anesthetic: Lidocaine 1% Route: Intravenous (IV) IV Access: Secured Sedation: Meaningful verbal contact was maintained at all times during the procedure  Indication(s): Analgesia and Anxiety   Indications: 1. Spondylosis without myelopathy or radiculopathy, lumbar region   2. Lumbar facet syndrome (Bilateral) (R>L)   3. Osteoarthritis of  Lumbar spine   4. Chronic low back pain (Primary Area of Pain) (Bilateral) (R>L)   5. Lumbar facet hypertrophy (Multilevel) (Bilateral)   6. Lumbar facet osteoarthritis    Pain Score: Pre-procedure: 5 /10 Post-procedure: 0-No pain/10  Pre-op Assessment:  [name] is a 57 y.o. (year old), female patient, seen today for interventional treatment. She  has a past surgical history that includes etopic pregnancy and Colonoscopy (2013). [name] has a current medication list which includes the following prescription(s): ibuprofen and tramadol, and the following Facility-Administered Medications: fentanyl, lactated ringers, and midazolam. Her primarily concern today is the Back Pain (right, lower)  Initial Vital Signs:  Pulse Rate: 84 Temp: 98.6 F (37 C) Resp: 16 BP: 132/76 SpO2: 98 %  BMI: Estimated body mass index is 39.68 kg/m as calculated from the  following:   Height as of this encounter: 5\' 3"  (1.6 m).   Weight as of this encounter: 224 lb (101.6 kg).  Risk Assessment: Allergies: Reviewed. She has No Known Allergies.  Allergy Precautions: None required Coagulopathies: Reviewed. None identified.  Blood-thinner therapy: None at this time Active Infection(s): Reviewed. None identified. [name] is afebrile  Site Confirmation: [name] was asked to confirm the procedure and laterality before marking the site Procedure checklist: Completed Consent: Before the procedure and under the influence of no sedative(s), amnesic(s), or anxiolytics, the patient was informed of the treatment options, risks and possible complications. To fulfill our ethical and legal obligations, as recommended by the American Medical Association's Code of Ethics, I have informed the patient of my clinical impression; the nature and purpose of the treatment or procedure; the risks, benefits, and possible complications of the intervention; the alternatives, including doing nothing; the risk(s) and benefit(s) of the alternative treatment(s) or procedure(s); and the risk(s) and benefit(s) of doing nothing. The patient was provided information about the general risks and possible complications associated with the procedure. These may include, but are not limited to: failure to achieve desired goals, infection, bleeding, organ or nerve damage, allergic reactions, paralysis, and death. In addition, the patient was informed of those risks and complications associated to Spine-related procedures, such as failure to decrease pain; infection (i.e.: Meningitis, epidural or intraspinal abscess); bleeding (i.e.: epidural hematoma, subarachnoid hemorrhage, or any other type of intraspinal or peri-dural bleeding); organ or nerve damage (i.e.: Any type of peripheral nerve, nerve root, or spinal cord injury) with subsequent damage to sensory, motor, and/or autonomic systems, resulting in  permanent pain, numbness, and/or weakness of one or several areas of the body; allergic reactions; (i.e.:  anaphylactic reaction); and/or death. Furthermore, the patient was informed of those risks and complications associated with the medications. These include, but are not limited to: allergic reactions (i.e.: anaphylactic or anaphylactoid reaction(s)); adrenal axis suppression; blood sugar elevation that in diabetics may result in ketoacidosis or comma; water retention that in patients with history of congestive heart failure may result in shortness of breath, pulmonary edema, and decompensation with resultant heart failure; weight gain; swelling or edema; medication-induced neural toxicity; particulate matter embolism and blood vessel occlusion with resultant organ, and/or nervous system infarction; and/or aseptic necrosis of one or more joints. Finally, the patient was informed that Medicine is not an exact science; therefore, there is also the possibility of unforeseen or unpredictable risks and/or possible complications that may result in a catastrophic outcome. The patient indicated having understood very clearly. We have given the patient no guarantees and we have made no promises. Enough time was given to the patient to ask questions, all of which were answered to the patient's satisfaction. [name] has indicated that she wanted to continue with the procedure. Attestation: I, the ordering provider, attest that I have discussed with the patient the benefits, risks, side-effects, alternatives, likelihood of achieving goals, and potential problems during recovery for the procedure that I have provided informed consent. Date  Time: 06/04/2017  1:02 PM  Pre-Procedure Preparation:  Monitoring: As per clinic protocol. Respiration, ETCO2, SpO2, BP, heart rate and rhythm monitor placed and checked for adequate function Safety Precautions: Patient was assessed for positional comfort and pressure points  before starting the procedure. Time-out: I initiated and conducted the "Time-out" before starting the procedure, as per protocol. The patient was asked to participate by confirming the accuracy of the "Time Out" information. Verification of the correct person, site, and procedure were performed and confirmed by me, the nursing staff, and the patient. "Time-out" conducted as per Joint Commission's Universal Protocol (UP.01.01.01). Time: 1408  Description of Procedure:       Position: Prone Laterality: Right Levels:  L2, L3, L4, L5, & S1 Medial Branch Level(s) Area Prepped: Lumbosacral Prepping solution: ChloraPrep (2% chlorhexidine gluconate and 70% isopropyl alcohol) Safety Precautions: Aspiration looking for blood return was conducted prior to all injections. At no point did we inject any substances, as a needle was being advanced. Before injecting, the patient was told to immediately notify me if she was experiencing any new onset of "ringing in the ears, or metallic taste in the mouth". No attempts were made at seeking any paresthesias. Safe injection practices and needle disposal techniques used. Medications properly checked for expiration dates. SDV (single dose vial) medications used. After the completion of the procedure, all disposable equipment used was discarded in the proper designated medical waste containers. Local Anesthesia: Protocol guidelines were followed. The patient was positioned over the fluoroscopy table. The area was prepped in the usual manner. The time-out was completed. The target area was identified using fluoroscopy. A 12-in long, straight, sterile hemostat was used with fluoroscopic guidance to locate the targets for each level blocked. Once located, the skin was marked with an approved surgical skin marker. Once all sites were marked, the skin (epidermis, dermis, and hypodermis), as well as deeper tissues (fat, connective tissue and muscle) were infiltrated with a small  amount of a short-acting local anesthetic, loaded on a 10cc syringe with a 25G, 1.5-in  Needle. An appropriate amount of time was allowed for local anesthetics to take effect before proceeding to the next step. Local Anesthetic: Lidocaine 2.0%  The unused portion of the local anesthetic was discarded in the proper designated containers. Technical explanation of process:  L2 Medial Branch Nerve Block (MBB): The target area for the L2 medial branch is at the junction of the postero-lateral aspect of the superior articular process and the superior, posterior, and medial edge of the transverse process of L3. Under fluoroscopic guidance, a Quincke needle was inserted until contact was made with os over the superior postero-lateral aspect of the pedicular shadow (target area). After negative aspiration for blood, 0.9mL of the nerve block solution was injected without difficulty or complication. The needle was removed intact. L3 Medial Branch Nerve Block (MBB): The target area for the L3 medial branch is at the junction of the postero-lateral aspect of the superior articular process and the superior, posterior, and medial edge of the transverse process of L4. Under fluoroscopic guidance, a Quincke needle was inserted until contact was made with os over the superior postero-lateral aspect of the pedicular shadow (target area). After negative aspiration for blood, 0.41mL of the nerve block solution was injected without difficulty or complication. The needle was removed intact. L4 Medial Branch Nerve Block (MBB): The target area for the L4 medial branch is at the junction of the postero-lateral aspect of the superior articular process and the superior, posterior, and medial edge of the transverse process of L5. Under fluoroscopic guidance, a Quincke needle was inserted until contact was made with os over the superior postero-lateral aspect of the pedicular shadow (target area). After negative aspiration for blood, 0.62mL of  the nerve block solution was injected without difficulty or complication. The needle was removed intact. L5 Medial Branch Nerve Block (MBB): The target area for the L5 medial branch is at the junction of the postero-lateral aspect of the superior articular process and the superior, posterior, and medial edge of the sacral ala. Under fluoroscopic guidance, a Quincke needle was inserted until contact was made with os over the superior postero-lateral aspect of the pedicular shadow (target area). After negative aspiration for blood, 0.47mL of the nerve block solution was injected without difficulty or complication. The needle was removed intact. S1 Medial Branch Nerve Block (MBB): The target area for the S1 medial branch is at the posterior and inferior 6 o'clock position of the L5-S1 facet joint. Under fluoroscopic guidance, the Quincke needle inserted for the L5 MBB was redirected until contact was made with os over the inferior and postero aspect of the sacrum, at the 6 o' clock position under the L5-S1 facet joint (Target area). After negative aspiration for blood, 0.56mL of the nerve block solution was injected without difficulty or complication. The needle was removed intact. Procedural Needles: 22-gauge, 3.5-inch, Quincke needles used for all levels. Nerve block solution: 0.2% PF-Ropivacaine + Triamcinolone (40 mg/mL) diluted to a final concentration of 4 mg of Triamcinolone/mL of Ropivacaine The unused portion of the solution was discarded in the proper designated containers.  Once the entire procedure was completed, the treated area was cleaned, making sure to leave some of the prepping solution back to take advantage of its long term bactericidal properties.   Illustration of the posterior view of the lumbar spine and the posterior neural structures. Laminae of L2 through S1 are labeled. DPRL5, dorsal primary ramus of L5; DPRS1, dorsal primary ramus of S1; DPR3, dorsal primary ramus of L3; FJ, facet  (zygapophyseal) joint L3-L4; I, inferior articular process of L4; LB1, lateral branch of dorsal primary ramus of L1; IAB, inferior articular branches from L3  medial branch (supplies L4-L5 facet joint); IBP, intermediate branch plexus; MB3, medial branch of dorsal primary ramus of L3; NR3, third lumbar nerve root; S, superior articular process of L5; SAB, superior articular branches from L4 (supplies L4-5 facet joint also); TP3, transverse process of L3.  Vitals:   06/04/17 1421 06/04/17 1431 06/04/17 1441 06/04/17 1451  BP: 124/78 (!) 143/82 (!) 148/87 112/90  Pulse:      Resp: 17 16 10  (!) 23  Temp:  (!) 96.1 F (35.6 C)    TempSrc:      SpO2: 96% 95% 99% 98%  Weight:      Height:        Start Time: 1408 hrs. End Time: 1418 hrs.  Imaging Guidance (Spinal):  Type of Imaging Technique: Fluoroscopy Guidance (Spinal) Indication(s): Assistance in needle guidance and placement for procedures requiring needle placement in or near specific anatomical locations not easily accessible without such assistance. Exposure Time: Please see nurses notes. Contrast: None used. Fluoroscopic Guidance: I was personally present during the use of fluoroscopy. "Tunnel Vision Technique" used to obtain the best possible view of the target area. Parallax error corrected before commencing the procedure. "Direction-depth-direction" technique used to introduce the needle under continuous pulsed fluoroscopy. Once target was reached, antero-posterior, oblique, and lateral fluoroscopic projection used confirm needle placement in all planes. Images permanently stored in EMR. Interpretation: No contrast injected. I personally interpreted the imaging intraoperatively. Adequate needle placement confirmed in multiple planes. Permanent images saved into the patient's record.  Antibiotic Prophylaxis:   Anti-infectives (From admission, onward)   None     Indication(s): None identified  Post-operative Assessment:    Post-procedure Vital Signs:  Pulse Rate: 84 Temp: (!) 96.1 F (35.6 C) Resp: (!) 23 BP: 112/90 SpO2: 98 %  EBL: None  Complications: No immediate post-treatment complications observed by team, or reported by patient.  Note: The patient tolerated the entire procedure well. A repeat set of vitals were taken after the procedure and the patient was kept under observation following institutional policy, for this type of procedure. Post-procedural neurological assessment was performed, showing return to baseline, prior to discharge. The patient was provided with post-procedure discharge instructions, including a section on how to identify potential problems. Should any problems arise concerning this procedure, the patient was given instructions to immediately contact us, at any time, without hesitation. In any case, we plan to contact the patient by telephone for a follow-up status report regarding this interventional procedure.  Comments:  No additional relevant information.  Plan of Care    Imaging Orders     DG C-Arm 1-60 Min-No Report  Procedure Orders     LUMBAR FACET(MEDIAL BRANCH NERVE BLOCK) MBNB  Medications ordered for procedure: Meds ordered this encounter  Medications  . midazolam (VERSED) 5 MG/5ML injection 1-2 mg    Make sure Flumazenil is available in the pyxis when using this medication. If oversedation occurs, administer 0.2 mg IV over 15 sec. If after 45 sec no response, administer 0.2 mg again over 1 min; may repeat at 1 min intervals; not to exceed 4 doses (1 mg)  . fentaNYL (SUBLIMAZE) injection 25-50 mcg    Make sure Narcan is available in the pyxis when using this medication. In the event of respiratory depression (RR< 8/min): Titrate NARCAN (naloxone) in increments of 0.1 to 0.2 mg IV at 2-3 minute intervals, until desired degree of reversal.  . lactated ringers infusion 1,000 mL  . lidocaine (XYLOCAINE) 2 % (with pres) injection 400  mg  . ropivacaine (PF) 2  mg/mL (0.2%) (NAROPIN) injection 18 mL  . triamcinolone acetonide (KENALOG-40) injection 80 mg   Medications administered: We administered midazolam, fentaNYL, lactated ringers, lidocaine, ropivacaine (PF) 2 mg/mL (0.2%), and triamcinolone acetonide.  See the medical record for exact dosing, route, and time of administration.  New Prescriptions   No medications on file   Disposition: Discharge home  Discharge Date & Time: 06/04/2017; 1514 hrs.   Physician-requested Follow-up: Return for post-procedure eval (2 wks), w/ Dr. Dossie Arbour.  Future Appointments  Date Time Provider [place]  06/19/2017  1:15 PM Milinda Pointer, MD [facility] None   Primary Care Physician: Donnie Coffin, MD Location: [facility] Note by: Gaspar Cola, MD Date: 06/04/2017; Time: 3:14 PM  Disclaimer:  Medicine is not an Chief Strategy Officer. The only guarantee in medicine is that nothing is guaranteed. It is important to note that the decision to proceed with this intervention was based on the information collected from the patient. The Data and conclusions were drawn from the patient's questionnaire, the interview, and the physical examination. Because the information was provided in large part by the patient, it cannot be guaranteed that it has not been purposely or unconsciously manipulated. Every effort has been made to obtain as much relevant data as possible for this evaluation. It is important to note that the conclusions that lead to this procedure are derived in large part from the available data. Always take into account that the treatment will also be dependent on availability of resources and existing treatment guidelines, considered by other Pain Management Practitioners as being common knowledge and practice, at the time of the intervention. For Medico-Legal purposes, it is also important to point out that variation in procedural techniques and pharmacological choices are  the acceptable norm. The indications, contraindications, technique, and results of the above procedure should only be interpreted and judged by a Board-Certified Interventional Pain Specialist with extensive familiarity and expertise in the same exact procedure and technique.

## 2017-06-04 ENCOUNTER — Ambulatory Visit (HOSPITAL_BASED_OUTPATIENT_CLINIC_OR_DEPARTMENT_OTHER): Payer: BLUE CROSS/BLUE SHIELD | Admitting: Pain Medicine

## 2017-06-04 ENCOUNTER — Ambulatory Visit
Admission: RE | Admit: 2017-06-04 | Discharge: 2017-06-04 | Disposition: A | Discharge status: Home / Self Care | Payer: BLUE CROSS/BLUE SHIELD | Source: Ambulatory Visit | Attending: Pain Medicine | Admitting: Pain Medicine

## 2017-06-04 ENCOUNTER — Encounter: Payer: Self-pay | Admitting: Pain Medicine

## 2017-06-04 ENCOUNTER — Other Ambulatory Visit: Payer: Self-pay

## 2017-06-04 VITALS — BP 112/90 | HR 84 | Temp 96.1°F | Resp 23 | Ht 63.0 in | Wt 224.0 lb

## 2017-06-04 DIAGNOSIS — G8929 Other chronic pain: Secondary | ICD-10-CM | POA: Diagnosis not present

## 2017-06-04 DIAGNOSIS — M5441 Lumbago with sciatica, right side: Secondary | ICD-10-CM

## 2017-06-04 DIAGNOSIS — M47816 Spondylosis without myelopathy or radiculopathy, lumbar region: Secondary | ICD-10-CM | POA: Diagnosis not present

## 2017-06-04 DIAGNOSIS — M8938 Hypertrophy of bone, other site: Secondary | ICD-10-CM | POA: Insufficient documentation

## 2017-06-04 DIAGNOSIS — M5442 Lumbago with sciatica, left side: Secondary | ICD-10-CM | POA: Diagnosis not present

## 2017-06-04 MED ORDER — TRIAMCINOLONE ACETONIDE 40 MG/ML IJ SUSP
80.0000 mg | Freq: Once | INTRAMUSCULAR | Status: AC
  Administered 2017-06-04: 40 mg
  Filled 2017-06-04: qty 2

## 2017-06-04 MED ORDER — ROPIVACAINE HCL 2 MG/ML IJ SOLN
18.0000 mL | Freq: Once | INTRAMUSCULAR | Status: AC
  Administered 2017-06-04: 10 mL via PERINEURAL
  Filled 2017-06-04: qty 20

## 2017-06-04 MED ORDER — MIDAZOLAM HCL 5 MG/5ML IJ SOLN
1.0000 mg | INTRAMUSCULAR | Status: DC | PRN
  Administered 2017-06-04: 3 mg via INTRAVENOUS
  Filled 2017-06-04: qty 5

## 2017-06-04 MED ORDER — FENTANYL CITRATE (PF) 100 MCG/2ML IJ SOLN
25.0000 ug | INTRAMUSCULAR | Status: DC | PRN
  Administered 2017-06-04: 50 ug via INTRAVENOUS
  Filled 2017-06-04: qty 2

## 2017-06-04 MED ORDER — LIDOCAINE HCL 2 % IJ SOLN
20.0000 mL | Freq: Once | INTRAMUSCULAR | Status: AC
  Administered 2017-06-04: 400 mg
  Filled 2017-06-04: qty 20

## 2017-06-04 MED ORDER — LACTATED RINGERS IV SOLN
1000.0000 mL | Freq: Once | INTRAVENOUS | Status: AC
  Administered 2017-06-04: 1000 mL via INTRAVENOUS

## 2017-06-04 NOTE — Patient Instructions (Signed)

## 2017-06-04 NOTE — Progress Notes (Signed)
Safety precautions to be maintained throughout the outpatient stay will include: orient to surroundings, keep bed in low position, maintain call bell within reach at all times, provide assistance with transfer out of bed and ambulation.  

## 2017-06-05 ENCOUNTER — Telehealth: Payer: Self-pay | Admitting: *Deleted

## 2017-06-05 NOTE — Telephone Encounter (Signed)
LVM

## 2017-06-06 ENCOUNTER — Ambulatory Visit: Payer: BLUE CROSS/BLUE SHIELD | Admitting: Pain Medicine

## 2017-06-10 ENCOUNTER — Encounter: Payer: Self-pay | Admitting: Pain Medicine

## 2017-06-10 ENCOUNTER — Ambulatory Visit: Payer: BLUE CROSS/BLUE SHIELD | Attending: Pain Medicine | Admitting: Pain Medicine

## 2017-06-10 ENCOUNTER — Other Ambulatory Visit: Payer: Self-pay

## 2017-06-10 VITALS — BP 130/76 | HR 98 | Temp 98.3°F | Resp 18 | Ht 63.0 in | Wt 224.0 lb

## 2017-06-10 DIAGNOSIS — M47816 Spondylosis without myelopathy or radiculopathy, lumbar region: Secondary | ICD-10-CM

## 2017-06-10 DIAGNOSIS — N189 Chronic kidney disease, unspecified: Secondary | ICD-10-CM | POA: Insufficient documentation

## 2017-06-10 DIAGNOSIS — M545 Low back pain: Secondary | ICD-10-CM | POA: Diagnosis present

## 2017-06-10 DIAGNOSIS — G894 Chronic pain syndrome: Secondary | ICD-10-CM | POA: Diagnosis not present

## 2017-06-10 DIAGNOSIS — M5441 Lumbago with sciatica, right side: Secondary | ICD-10-CM

## 2017-06-10 DIAGNOSIS — M4726 Other spondylosis with radiculopathy, lumbar region: Secondary | ICD-10-CM | POA: Insufficient documentation

## 2017-06-10 DIAGNOSIS — Z79899 Other long term (current) drug therapy: Secondary | ICD-10-CM | POA: Diagnosis not present

## 2017-06-10 DIAGNOSIS — M79605 Pain in left leg: Secondary | ICD-10-CM | POA: Diagnosis not present

## 2017-06-10 DIAGNOSIS — M25562 Pain in left knee: Secondary | ICD-10-CM | POA: Diagnosis not present

## 2017-06-10 DIAGNOSIS — E1122 Type 2 diabetes mellitus with diabetic chronic kidney disease: Secondary | ICD-10-CM | POA: Insufficient documentation

## 2017-06-10 DIAGNOSIS — D649 Anemia, unspecified: Secondary | ICD-10-CM | POA: Insufficient documentation

## 2017-06-10 DIAGNOSIS — M8938 Hypertrophy of bone, other site: Secondary | ICD-10-CM | POA: Insufficient documentation

## 2017-06-10 DIAGNOSIS — M1712 Unilateral primary osteoarthritis, left knee: Secondary | ICD-10-CM | POA: Diagnosis not present

## 2017-06-10 DIAGNOSIS — M79604 Pain in right leg: Secondary | ICD-10-CM | POA: Diagnosis not present

## 2017-06-10 DIAGNOSIS — G8929 Other chronic pain: Secondary | ICD-10-CM

## 2017-06-10 DIAGNOSIS — M5442 Lumbago with sciatica, left side: Secondary | ICD-10-CM | POA: Diagnosis not present

## 2017-06-10 DIAGNOSIS — M5116 Intervertebral disc disorders with radiculopathy, lumbar region: Secondary | ICD-10-CM | POA: Insufficient documentation

## 2017-06-10 NOTE — Progress Notes (Signed)
Safety precautions to be maintained throughout the outpatient stay will include: orient to surroundings, keep bed in low position, maintain call bell within reach at all times, provide assistance with transfer out of bed and ambulation.  

## 2017-06-10 NOTE — Progress Notes (Signed)
Patient's Name: Meghan Vasquez  MRN: [id number]  Referring Provider: Aycock, Ngwe A, MD  DOB: 10/25/1960  PCP: Aycock, Ngwe A, MD  DOS: 06/10/2017  Note by: Francisco A Naveira, MD  Service setting: Ambulatory outpatient  Specialty: Interventional Pain Management  Location: [facility] (AMB) Pain Management Facility    Patient type: Established   Primary Reason(s) for Visit: Encounter for prescription drug management & post-procedure evaluation of chronic illness with mild to moderate exacerbation(Level of risk: moderate) CC: Back Pain (low and right)  HPI  Meghan Vasquez is a 56 y.o. year old, female patient, who comes today for a post-procedure evaluation and medication management. 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); Infertility of tubal origin; Perimenopause; Anemia; Skin cyst; Breast cyst; Chronic low back pain (Primary Area of Pain) (Bilateral) (R>L); Chronic lower extremity pain (Secondary Area of Pain) (Bilateral) (R>L); Chronic knee pain (Tertiary Area of Pain) (Left); Chronic pain syndrome; Disorder of bone, unspecified; Other specified health status; Other long term (current) drug therapy; Lumbar facet syndrome (Bilateral) (R>L); Osteoarthritis of knee (Left); Tricompartment osteoarthritis of knee (Left); DDD (degenerative disc disease), lumbar; Lumbar facet osteoarthritis; Osteoarthritis of  Lumbar spine; Chronic lumbar radicular pain (R) (L5); Lumbar facet hypertrophy (Multilevel) (Bilateral); and Spondylosis without myelopathy or radiculopathy, lumbar region on their problem list. Her primarily concern today is the Back Pain (low and right)  Pain Assessment: Location: Lower, Right Back Onset: More than a month ago Duration: Chronic pain Quality: (denies pain today) Severity: 0-No pain/10 (self-reported pain score)  Note: Reported level is compatible with observation.                               Timing: Other (Comment)(denies pain today)  Meghan Vasquez was last  seen on 06/04/2017 for a procedure. During today's appointment we reviewed Meghan Vasquez's post-procedure results, as well as her outpatient medication regimen.  Further details on both, my assessment(s), as well as the proposed treatment plan, please see below.  Controlled Substance Pharmacotherapy Assessment REMS (Risk Evaluation and Mitigation Strategy)  Analgesic: Tramadol 50 mg 1 tablet p.o. every 6 hours (200 mg/day of tramadol) (20 MME/day) MME/day: 20 mg/day.  Shatley, Jennifer C, RN  06/10/2017  1:56 PM  Sign at close encounter Safety precautions to be maintained throughout the outpatient stay will include: orient to surroundings, keep bed in low position, maintain call bell within reach at all times, provide assistance with transfer out of bed and ambulation.    Pharmacokinetics: Liberation and absorption (onset of action): WNL Distribution (time to peak effect): WNL Metabolism and excretion (duration of action): WNL         Pharmacodynamics: Desired effects: Analgesia: Meghan Vasquez reports >50% benefit. Functional ability: Patient reports that medication allows her to accomplish basic ADLs Clinically meaningful improvement in function (CMIF): Sustained CMIF goals met Perceived effectiveness: Described as relatively effective, allowing for increase in activities of daily living (ADL) Undesirable effects: Side-effects or Adverse reactions: None reported Monitoring: [place] PMP: Online review of the past 12-month period conducted. Compliant with practice rules and regulations Last UDS on record: No results found for: SUMMARY UDS interpretation: Compliant          Medication Assessment Form: Reviewed. Patient indicates being compliant with therapy Treatment compliance: Compliant Risk Assessment Profile: Aberrant behavior: See prior evaluations. None observed or detected today Comorbid factors increasing risk of overdose: See prior notes. No additional risks detected today Risk of  substance   use disorder (SUD): Low Opioid Risk Tool - 05/13/17 1107      Family History of Substance Abuse   Alcohol  Negative    Illegal Drugs  Positive Female    Rx Drugs  Negative      Personal History of Substance Abuse   Alcohol  Negative    Illegal Drugs  Negative    Rx Drugs  Negative      Total Score   Opioid Risk Tool Scoring  2    Opioid Risk Interpretation  Low Risk      ORT Scoring interpretation table:  Score <3 = Low Risk for SUD  Score between 4-7 = Moderate Risk for SUD  Score >8 = High Risk for Opioid Abuse   Risk Mitigation Strategies:  Patient Counseling: Covered Patient-Prescriber Agreement (PPA): Present and active  Notification to other healthcare providers: Done  Pharmacologic Plan: No change in therapy, at this time.             Post-Procedure Assessment  06/04/2017 Procedure: Diagnostic right-sided lumbar facet block #1 under fluoroscopic guidance and IV sedation Pre-procedure pain score:  5/10 Post-procedure pain score: 0/10 (100% relief) Influential Factors: BMI: 39.68 kg/m Intra-procedural challenges: None observed.         Assessment challenges: None detected.              Reported side-effects: None.        Post-procedural adverse reactions or complications: None reported         Sedation: Sedation provided. When no sedatives are used, the analgesic levels obtained are directly associated to the effectiveness of the local anesthetics. However, when sedation is provided, the level of analgesia obtained during the initial 1 hour following the intervention, is believed to be the result of a combination of factors. These factors may include, but are not limited to: 1. The effectiveness of the local anesthetics used. 2. The effects of the analgesic(s) and/or anxiolytic(s) used. 3. The degree of discomfort experienced by the patient at the time of the procedure. 4. The patients ability and reliability in recalling and recording the events. 5. The  presence and influence of possible secondary gains and/or psychosocial factors. Reported result: Relief experienced during the 1st hour after the procedure: 100 % (Ultra-Short Term Relief) Meghan Vasquez has indicated area to have been numb during this time. Interpretative annotation: Clinically appropriate result. Analgesia during this period is likely to be Local Anesthetic and/or IV Sedative (Analgesic/Anxiolytic) related. Such findings would suggest that 100% of the pain, in the treated area, is coming from the blocked structure.  Effects of local anesthetic: The analgesic effects attained during this period are directly associated to the localized infiltration of local anesthetics and therefore cary significant diagnostic value as to the etiological location, or anatomical origin, of the pain. Expected duration of relief is directly dependent on the pharmacodynamics of the local anesthetic used. Long-acting (4-6 hours) anesthetics used.  Reported result: Relief during the next 4 to 6 hour after the procedure: 100 % (Short-Term Relief) Meghan Vasquez has indicated area to have been numb during this time. Interpretative annotation: Clinically appropriate result. Analgesia during this period is likely to be Local Anesthetic-related. Partial relief from local anesthetics would suggest that treated area is not 100% responsible for the patient's symptoms.  Long-term benefit: Defined as the period of time past the expected duration of local anesthetics (1 hour for short-acting and 4-6 hours for long-acting). With the possible exception of prolonged sympathetic blockade from the  local anesthetics, benefits during this period are typically attributed to, or associated with, other factors such as analgesic sensory neuropraxia, antiinflammatory effects, or beneficial biochemical changes provided by agents other than the local anesthetics.  Reported result: Extended relief following procedure: 100 % (Long-Term Relief)             Interpretative annotation: Clinically possible results. Good relief. Therapeutic success. Inflammation plays a part in the etiology to the pain.          Current benefits: Defined as reported results that persistent at this point in time.   Analgesia: 100 % Meghan Vasquez reports improvement of axial symptoms. Function: Meghan Vasquez reports improvement in function ROM: Meghan Vasquez reports improvement in ROM Interpretative annotation: Complete relief. Therapeutic success. Effective therapeutic approach.          Interpretation: Results would suggest a successful diagnostic intervention.                  Plan:  Set up procedure as a PRN palliative treatment option for this patient.                Laboratory Chemistry  Inflammation Markers (CRP: Acute Phase) (ESR: Chronic Phase) Lab Results  Component Value Date   CRP 6.4 (H) 04/17/2017   ESRSEDRATE CANCELED 04/17/2017                         Renal Function Markers Lab Results  Component Value Date   BUN 17 04/17/2017   CREATININE 1.23 (H) 04/17/2017   GFRAA 57 (L) 04/17/2017   GFRNONAA 49 (L) 04/17/2017                 Hepatic Function Markers Lab Results  Component Value Date   AST 14 04/17/2017   ALT 18 07/01/2015   ALBUMIN 4.6 04/17/2017   ALKPHOS 96 04/17/2017                 Electrolytes Lab Results  Component Value Date   NA 142 04/17/2017   K 4.4 04/17/2017   CL 101 04/17/2017   CALCIUM 10.0 04/17/2017   MG 1.9 04/17/2017                        Neuropathy Markers Lab Results  Component Value Date   VITAMINB12 739 04/17/2017   HIV Non Reactive 01/31/2015                 Bone Pathology Markers Lab Results  Component Value Date   25OHVITD1 39 04/17/2017   25OHVITD2 <1.0 04/17/2017   25OHVITD3 39 04/17/2017                         Coagulation Parameters Lab Results  Component Value Date   PLT 249 12/14/2015                 Cardiovascular Markers Lab Results  Component Value Date   HGB 11.8  12/14/2015   HCT 35.0 12/14/2015                 Note: Lab results reviewed.  Recent Diagnostic Imaging Results  DG C-Arm 1-60 Min-No Report Fluoroscopy was utilized by the requesting physician.  No radiographic  interpretation.   Complexity Note: I personally reviewed the fluoroscopic imaging of the procedure.                          Meds   Current Outpatient Medications:  .  ibuprofen (ADVIL,MOTRIN) 200 MG tablet, Take 200 mg by mouth every 6 (six) hours as needed., Disp: , Rfl:  .  traMADol (ULTRAM) 50 MG tablet, Take 1 tablet (50 mg total) by mouth every 6 (six) hours as needed for severe pain., Disp: 120 tablet, Rfl: 0  ROS  Constitutional: Denies any fever or chills Gastrointestinal: No reported hemesis, hematochezia, vomiting, or acute GI distress Musculoskeletal: Denies any acute onset joint swelling, redness, loss of ROM, or weakness Neurological: No reported episodes of acute onset apraxia, aphasia, dysarthria, agnosia, amnesia, paralysis, loss of coordination, or loss of consciousness  Allergies  Meghan Vasquez has No Known Allergies.  PFSH  Drug: Meghan Vasquez  reports that she does not use drugs. Alcohol:  reports that she does not drink alcohol. Tobacco:  reports that  has never smoked. she has never used smokeless tobacco. Medical:  has a past medical history of Chronic sciatica, Diabetes mellitus without complication (HCC), Lumbar radiculopathy, and Sciatica. Surgical: Meghan Vasquez  has a past surgical history that includes etopic pregnancy and Colonoscopy (2013). Family: family history includes Aplastic anemia in her daughter; Breast cancer in her cousin; Cancer in her daughter; Diabetes in her mother; Emphysema in her brother; Kidney disease in her daughter; Other in her daughter.  Constitutional Exam  General appearance: Well nourished, well developed, and well hydrated. In no apparent acute distress Vitals:   06/10/17 1354  BP: 130/76  Pulse: 98  Resp: 18  Temp:  98.3 F (36.8 C)  TempSrc: Oral  SpO2: 98%  Weight: 224 lb (101.6 kg)  Height: 5' 3" (1.6 m)   BMI Assessment: Estimated body mass index is 39.68 kg/m as calculated from the following:   Height as of this encounter: 5' 3" (1.6 m).   Weight as of this encounter: 224 lb (101.6 kg).  BMI interpretation table: BMI level Category Range association with higher incidence of chronic pain  <18 kg/m2 Underweight   18.5-24.9 kg/m2 Ideal body weight   25-29.9 kg/m2 Overweight Increased incidence by 20%  30-34.9 kg/m2 Obese (Class I) Increased incidence by 68%  35-39.9 kg/m2 Severe obesity (Class II) Increased incidence by 136%  >40 kg/m2 Extreme obesity (Class III) Increased incidence by 254%   BMI Readings from Last 4 Encounters:  06/10/17 39.68 kg/m  06/04/17 39.68 kg/m  05/13/17 39.68 kg/m  04/29/17 39.68 kg/m   Wt Readings from Last 4 Encounters:  06/10/17 224 lb (101.6 kg)  06/04/17 224 lb (101.6 kg)  05/13/17 224 lb (101.6 kg)  04/29/17 224 lb (101.6 kg)  Psych/Mental status: Alert, oriented x 3 (person, place, & time)       Eyes: PERLA Respiratory: No evidence of acute respiratory distress  Cervical Spine Area Exam  Skin & Axial Inspection: No masses, redness, edema, swelling, or associated skin lesions Alignment: Symmetrical Functional ROM: Unrestricted ROM      Stability: No instability detected Muscle Tone/Strength: Functionally intact. No obvious neuro-muscular anomalies detected. Sensory (Neurological): Unimpaired Palpation: No palpable anomalies              Upper Extremity (UE) Exam    Side: Right upper extremity  Side: Left upper extremity  Skin & Extremity Inspection: Skin color, temperature, and hair growth are WNL. No peripheral edema or cyanosis. No masses, redness, swelling, asymmetry, or associated skin lesions. No contractures.  Skin & Extremity Inspection: Skin color, temperature, and hair growth are WNL. No peripheral edema or cyanosis. No masses,  redness,   swelling, asymmetry, or associated skin lesions. No contractures.  Functional ROM: Unrestricted ROM          Functional ROM: Unrestricted ROM          Muscle Tone/Strength: Functionally intact. No obvious neuro-muscular anomalies detected.  Muscle Tone/Strength: Functionally intact. No obvious neuro-muscular anomalies detected.  Sensory (Neurological): Unimpaired          Sensory (Neurological): Unimpaired          Palpation: No palpable anomalies              Palpation: No palpable anomalies              Specialized Test(s): Deferred         Specialized Test(s): Deferred          Thoracic Spine Area Exam  Skin & Axial Inspection: No masses, redness, or swelling Alignment: Symmetrical Functional ROM: Unrestricted ROM Stability: No instability detected Muscle Tone/Strength: Functionally intact. No obvious neuro-muscular anomalies detected. Sensory (Neurological): Unimpaired Muscle strength & Tone: No palpable anomalies  Lumbar Spine Area Exam  Skin & Axial Inspection: No masses, redness, or swelling Alignment: Symmetrical Functional ROM: Improved after treatment      Stability: No instability detected Muscle Tone/Strength: Functionally intact. No obvious neuro-muscular anomalies detected. Sensory (Neurological): Unimpaired Palpation: No palpable anomalies       Provocative Tests: Lumbar Hyperextension and rotation test: improved       Lumbar Lateral bending test: evaluation deferred today       Patrick's Maneuver: evaluation deferred today                    Gait & Posture Assessment  Ambulation: Unassisted Gait: Relatively normal for age and body habitus Posture: WNL   Lower Extremity Exam    Side: Right lower extremity  Side: Left lower extremity  Skin & Extremity Inspection: Skin color, temperature, and hair growth are WNL. No peripheral edema or cyanosis. No masses, redness, swelling, asymmetry, or associated skin lesions. No contractures.  Skin & Extremity Inspection:  Skin color, temperature, and hair growth are WNL. No peripheral edema or cyanosis. No masses, redness, swelling, asymmetry, or associated skin lesions. No contractures.  Functional ROM: Unrestricted ROM          Functional ROM: Unrestricted ROM          Muscle Tone/Strength: Functionally intact. No obvious neuro-muscular anomalies detected.  Muscle Tone/Strength: Functionally intact. No obvious neuro-muscular anomalies detected.  Sensory (Neurological): Unimpaired  Sensory (Neurological): Unimpaired  Palpation: No palpable anomalies  Palpation: No palpable anomalies   Assessment  Primary Diagnosis & Pertinent Problem List: The primary encounter diagnosis was Chronic low back pain (Primary Area of Pain) (Bilateral) (R>L). Diagnoses of Chronic lower extremity pain (Secondary Area of Pain) (Bilateral) (R>L), Chronic knee pain (Tertiary Area of Pain) (Left), Spondylosis without myelopathy or radiculopathy, lumbar region, and Chronic pain syndrome were also pertinent to this visit.  Status Diagnosis  Controlled Controlled Controlled 1. Chronic low back pain (Primary Area of Pain) (Bilateral) (R>L)   2. Chronic lower extremity pain (Secondary Area of Pain) (Bilateral) (R>L)   3. Chronic knee pain (Tertiary Area of Pain) (Left)   4. Spondylosis without myelopathy or radiculopathy, lumbar region   5. Chronic pain syndrome     Problems updated and reviewed during this visit: No problems updated. Plan of Care  Pharmacotherapy (Medications Ordered): No orders of the defined types were placed in this encounter.  Medications administered today: [name] had no  medications administered during this visit.   Procedure Orders     LUMBAR FACET(MEDIAL BRANCH NERVE BLOCK) MBNB Lab Orders  No laboratory test(s) ordered today   Imaging Orders  No imaging studies ordered today   Referral Orders  No referral(s) requested today    Interventional management options: Planned, scheduled, and/or  pending:   None at this time   Considering:   Diagnostic right-sided lumbar facet block #2  Possible right-sided lumbar facet RFA  Diagnostic bilaterallumbar facet nerve block Possible bilateral lumbar facet RFA Diagnostic left-sided intra-articular knee injection Diagnostic left sided Hyalgan series Diagnostic left-sided genicular nerve block Possible left-sided genicular RFA   Palliative PRN treatment(s):   Diagnostic right-sided lumbar facet block #2 under fluoroscopic guidance and IV sedation   Provider-requested follow-up: Return for PRN Procedure: (R) L-FCT BLK (w/ sedation).  No future appointments. Primary Care Physician: Aycock, Ngwe A, MD Location: [facility] Note by: Francisco A Naveira, MD Date: 06/10/2017; Time: 2:16 PM 

## 2017-06-19 ENCOUNTER — Ambulatory Visit: Payer: BLUE CROSS/BLUE SHIELD | Admitting: Pain Medicine

## 2017-08-09 ENCOUNTER — Emergency Department: Payer: BLUE CROSS/BLUE SHIELD

## 2017-08-09 ENCOUNTER — Emergency Department
Admission: EM | Admit: 2017-08-09 | Discharge: 2017-08-09 | Disposition: A | Discharge status: Home / Self Care | Payer: BLUE CROSS/BLUE SHIELD | Attending: Student in an Organized Health Care Education/Training Program | Admitting: Student in an Organized Health Care Education/Training Program

## 2017-08-09 ENCOUNTER — Encounter: Payer: Self-pay | Admitting: Emergency Medicine

## 2017-08-09 ENCOUNTER — Other Ambulatory Visit: Payer: Self-pay

## 2017-08-09 DIAGNOSIS — Y998 Other external cause status: Secondary | ICD-10-CM | POA: Diagnosis not present

## 2017-08-09 DIAGNOSIS — N189 Chronic kidney disease, unspecified: Secondary | ICD-10-CM | POA: Insufficient documentation

## 2017-08-09 DIAGNOSIS — F0781 Postconcussional syndrome: Secondary | ICD-10-CM | POA: Diagnosis not present

## 2017-08-09 DIAGNOSIS — S0003XA Contusion of scalp, initial encounter: Secondary | ICD-10-CM | POA: Insufficient documentation

## 2017-08-09 DIAGNOSIS — S0990XA Unspecified injury of head, initial encounter: Secondary | ICD-10-CM | POA: Diagnosis present

## 2017-08-09 DIAGNOSIS — E1122 Type 2 diabetes mellitus with diabetic chronic kidney disease: Secondary | ICD-10-CM | POA: Insufficient documentation

## 2017-08-09 DIAGNOSIS — Y9389 Activity, other specified: Secondary | ICD-10-CM | POA: Diagnosis not present

## 2017-08-09 DIAGNOSIS — Y92018 Other place in single-family (private) house as the place of occurrence of the external cause: Secondary | ICD-10-CM | POA: Diagnosis not present

## 2017-08-09 DIAGNOSIS — W208XXA Other cause of strike by thrown, projected or falling object, initial encounter: Secondary | ICD-10-CM | POA: Diagnosis not present

## 2017-08-09 NOTE — Discharge Instructions (Addendum)
Continue taking your regular medication as needed for pain.  You may use ice to your scalp as needed for discomfort.  Follow-up with your primary care provider if any continued problems.  You may also take Tylenol with your tramadol if extra pain medication as needed.

## 2017-08-09 NOTE — ED Triage Notes (Signed)
STates attic ladder hit top of head on Wednesday.  Patient states she just feels a little off.  AAOx3.  Skin warm and dry.Marland Kitchen NAD

## 2017-08-09 NOTE — ED Provider Notes (Signed)
[facility] Emergency Department Provider Note  ____________________________________________   First MD Initiated Contact with Patient 08/09/17 1748     (approximate)  I have reviewed the triage vital signs and the nursing notes.   HISTORY  Chief Complaint Head Injury   HPI Windie Marasco is a 57 y.o. female is here with complaint of headache.  Patient states that 2 days ago she was pushing her attic ladder back up into the ceiling when it fell and struck her in the head.  Patient denies any loss of consciousness but states she has not felt quite right.  She has not taken any over-the-counter medication but has continued taking her regular medication as prescribed.  She denies any nausea, vomiting or dizziness.  There has not been any paresthesias into her upper or lower extremities and she denies any back pain.  Patient is photophobic.  She has been using ice packs to her head without any relief.  Currently she rates her pain as 6 out of 10.   Past Medical History:  Diagnosis Date  . Chronic sciatica   . Diabetes mellitus without complication ([place])   . Lumbar radiculopathy   . Sciatica    Workers Comp    Patient Active Problem List   Diagnosis Date Noted  . Spondylosis without myelopathy or radiculopathy, lumbar region 06/03/2017  . Lumbar facet hypertrophy (Multilevel) (Bilateral) 05/23/2017  . Lumbar facet syndrome (Bilateral) (R>L) 04/29/2017  . Osteoarthritis of knee (Left) 04/29/2017  . Tricompartment osteoarthritis of knee (Left) 04/29/2017  . DDD (degenerative disc disease), lumbar 04/29/2017  . Lumbar facet osteoarthritis 04/29/2017  . Osteoarthritis of  Lumbar spine 04/29/2017  . Chronic lumbar radicular pain (R) (L5) 04/29/2017  . Chronic low back pain (Primary Area of Pain) (Bilateral) (R>L) 04/17/2017  . Chronic lower extremity pain (Secondary Area of Pain) (Bilateral) (R>L) 04/17/2017  . Chronic knee pain [facility] Area of Pain) (Left)  04/17/2017  . Chronic pain syndrome 04/17/2017  . Disorder of bone, unspecified 04/17/2017  . Other specified health status 04/17/2017  . Other long term (current) drug therapy 04/17/2017  . Skin cyst 09/05/2015  . Breast cyst 09/05/2015  . Anemia 02/01/2015  . 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 Perimenopause 01/31/2015  . 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 COLONOSCOPY  2013  . etopic pregnancy      Prior to Admission medications   Medication Sig Start Date End Date Taking? Authorizing Provider  ibuprofen (ADVIL,MOTRIN) 200 MG tablet Take 200 mg by mouth every 6 (six) hours as needed.    [provider]  traMADol (ULTRAM) 50 MG tablet Take 1 tablet (50 mg total) by mouth every 6 (six) hours as needed for severe pain. 05/13/17 06/12/17  Milinda Pointer, MD    Allergies Patient has no known allergies.  Family History  Problem Relation Age of Onset  . Diabetes Mother   . Emphysema Brother   . Other Daughter        Scarcoidosis  . Aplastic anemia Daughter   . Kidney disease Daughter   . Cancer Daughter        Breast Bilateral Mastectomy age 13, kidney   . Breast cancer Cousin     Social History Social History   Tobacco Use  . Smoking status: Never Smoker  . Smokeless tobacco: Never Used  Substance Use Topics  . Alcohol use: No  . Drug use:  No    Review of Systems Constitutional: No fever/chills Eyes: Positive photophobia ENT: Negative for injury Cardiovascular: Denies chest pain. Respiratory: Denies shortness of breath. Gastrointestinal:   No nausea, no vomiting.  Musculoskeletal: Negative for back pain. Skin: Negative for skin injury. Neurological: Positive for headaches, focal weakness or numbness. ___________________________________________   PHYSICAL EXAM:  VITAL SIGNS: ED Triage Vitals  Enc Vitals Group     BP 08/09/17 1733 (!) 157/75     Pulse  Rate 08/09/17 1733 82     Resp 08/09/17 1733 16     Temp 08/09/17 1733 98.2 F (36.8 C)     Temp Source 08/09/17 1733 Oral     SpO2 08/09/17 1733 100 %     Weight 08/09/17 1732 222 lb (100.7 kg)     Height 08/09/17 1732 5\' 3"  (1.6 m)     Head Circumference --      Peak Flow --      Pain Score 08/09/17 1732 6     Pain Loc --      Pain Edu? --      Excl. in [place]? --    Constitutional: Alert and oriented. Well appearing and in no acute distress.  Patient is lying in a darkened room with an ice pack on top of her head. Eyes: Conjunctivae are normal. PERRL. EOMI. patient is photophobic. Head: Atraumatic. Nose: No trauma. Neck: No stridor.  Minimal tenderness on palpation of cervical spine posteriorly.  Range of motion increases pain. Cardiovascular: Normal rate, regular rhythm. Grossly normal heart sounds.  Good peripheral circulation. Respiratory: Normal respiratory effort.  No retractions. Lungs CTAB. Musculoskeletal: Moves upper and lower extremities without any difficulty.  There is no tenderness on palpation of the thoracic or lumbar spine.  Nontender lower extremities and patient moves upper extremities without any difficulty. Neurologic:  Normal speech and language. No gross focal neurologic deficits are appreciated.  Skin:  Skin is warm, dry and intact.  No abrasions or ecchymosis is noted. Psychiatric: Mood and affect are normal. Speech and behavior are normal.  ____________________________________________   LABS (all labs ordered are listed, but only abnormal results are displayed)  Labs Reviewed - No data to display  RADIOLOGY   Official radiology report(s): Ct Head Wo Contrast  Result Date: 08/09/2017 CLINICAL DATA:  [place] off ladder and hit top of head EXAM: CT HEAD WITHOUT CONTRAST CT CERVICAL SPINE WITHOUT CONTRAST TECHNIQUE: Multidetector CT imaging of the head and cervical spine was performed following the standard protocol without intravenous contrast. Multiplanar CT  image reconstructions of the cervical spine were also generated. COMPARISON:  None. FINDINGS: CT HEAD FINDINGS Brain: No evidence of acute infarction, hemorrhage, hydrocephalus, extra-axial collection or mass lesion/mass effect. Vascular: No hyperdense vessel or unexpected calcification. Skull: Normal. Negative for fracture or focal lesion. Sinuses/Orbits: Old fracture medial wall right orbit. Paranasal sinuses are clear Other: None CT CERVICAL SPINE FINDINGS Alignment: No subluxation.  Facet alignment is within normal limits. Skull base and vertebrae: No acute fracture. No primary bone lesion or focal pathologic process. Soft tissues and spinal canal: No prevertebral fluid or swelling. No visible canal hematoma. Disc levels:  Moderate diffuse degenerative changes C3 through C7. Upper chest: Negative. Other: None IMPRESSION: 1. Negative non contrasted CT appearance of the brain. Old fracture medial wall right orbit 2. Straightening of the cervical spine with degenerative changes. No acute osseous abnormality. Electronically Signed   By: Donavan Foil M.D.   On: 08/09/2017 18:23   Ct Cervical Spine Wo  Contrast  Result Date: 08/09/2017 CLINICAL DATA:  [place] off ladder and hit top of head EXAM: CT HEAD WITHOUT CONTRAST CT CERVICAL SPINE WITHOUT CONTRAST TECHNIQUE: Multidetector CT imaging of the head and cervical spine was performed following the standard protocol without intravenous contrast. Multiplanar CT image reconstructions of the cervical spine were also generated. COMPARISON:  None. FINDINGS: CT HEAD FINDINGS Brain: No evidence of acute infarction, hemorrhage, hydrocephalus, extra-axial collection or mass lesion/mass effect. Vascular: No hyperdense vessel or unexpected calcification. Skull: Normal. Negative for fracture or focal lesion. Sinuses/Orbits: Old fracture medial wall right orbit. Paranasal sinuses are clear Other: None CT CERVICAL SPINE FINDINGS Alignment: No subluxation.  Facet alignment is within  normal limits. Skull base and vertebrae: No acute fracture. No primary bone lesion or focal pathologic process. Soft tissues and spinal canal: No prevertebral fluid or swelling. No visible canal hematoma. Disc levels:  Moderate diffuse degenerative changes C3 through C7. Upper chest: Negative. Other: None IMPRESSION: 1. Negative non contrasted CT appearance of the brain. Old fracture medial wall right orbit 2. Straightening of the cervical spine with degenerative changes. No acute osseous abnormality. Electronically Signed   By: Donavan Foil M.D.   On: 08/09/2017 18:23    ____________________________________________   PROCEDURES  Procedure(s) performed: None  Procedures  Critical Care performed: No  ____________________________________________   INITIAL IMPRESSION / ASSESSMENT AND PLAN / ED COURSE  As part of my medical decision making, I reviewed the following data within the electronic MEDICAL RECORD NUMBER Notes from prior ED visits and [place] Controlled Substance Database  Patient was made aware that CT head and neck were negative for fracture.  Patient was offered pain medication but states that she already has tramadol at home which she will continue taking.  She is encouraged to follow-up with her PCP if any continued problems.  Patient is reassured that she does not have a head bleed which was her main concern.  ____________________________________________   FINAL CLINICAL IMPRESSION(S) / ED DIAGNOSES  Final diagnoses:  Contusion of scalp, initial encounter  Post concussive syndrome     ED Discharge Orders    None       Note:  This document was prepared using Dragon voice recognition software and may include unintentional dictation errors.    Johnn Hai, PA-C 08/09/17 1858    Merlyn Lot, MD 08/09/17 2050

## 2017-09-09 ENCOUNTER — Emergency Department: Payer: BLUE CROSS/BLUE SHIELD

## 2017-09-09 ENCOUNTER — Other Ambulatory Visit: Payer: Self-pay

## 2017-09-09 ENCOUNTER — Encounter: Payer: Self-pay | Admitting: Emergency Medicine

## 2017-09-09 ENCOUNTER — Emergency Department
Admission: EM | Admit: 2017-09-09 | Discharge: 2017-09-09 | Disposition: A | Discharge status: Home / Self Care | Payer: BLUE CROSS/BLUE SHIELD | Attending: Emergency Medicine | Admitting: Emergency Medicine

## 2017-09-09 DIAGNOSIS — E119 Type 2 diabetes mellitus without complications: Secondary | ICD-10-CM | POA: Insufficient documentation

## 2017-09-09 DIAGNOSIS — R110 Nausea: Secondary | ICD-10-CM | POA: Insufficient documentation

## 2017-09-09 DIAGNOSIS — H81391 Other peripheral vertigo, right ear: Secondary | ICD-10-CM | POA: Diagnosis not present

## 2017-09-09 DIAGNOSIS — R42 Dizziness and giddiness: Secondary | ICD-10-CM | POA: Diagnosis present

## 2017-09-09 LAB — URINALYSIS, COMPLETE (UACMP) WITH MICROSCOPIC
BILIRUBIN URINE: NEGATIVE
Glucose, UA: NEGATIVE mg/dL
Hgb urine dipstick: NEGATIVE
KETONES UR: NEGATIVE mg/dL
LEUKOCYTES UA: NEGATIVE
Nitrite: NEGATIVE
PROTEIN: NEGATIVE mg/dL
Specific Gravity, Urine: 1.018 (ref 1.005–1.030)
pH: 5 (ref 5.0–8.0)

## 2017-09-09 LAB — CBC
HEMATOCRIT: 34.7 % — AB (ref 35.0–47.0)
Hemoglobin: 11.7 g/dL — ABNORMAL LOW (ref 12.0–16.0)
MCH: 29.9 pg (ref 26.0–34.0)
MCHC: 33.6 g/dL (ref 32.0–36.0)
MCV: 89.1 fL (ref 80.0–100.0)
PLATELETS: 276 10*3/uL (ref 150–440)
RBC: 3.9 MIL/uL (ref 3.80–5.20)
RDW: 14.4 % (ref 11.5–14.5)
WBC: 8.1 10*3/uL (ref 3.6–11.0)

## 2017-09-09 LAB — BASIC METABOLIC PANEL
Anion gap: 7 (ref 5–15)
BUN: 20 mg/dL (ref 6–20)
CALCIUM: 9.5 mg/dL (ref 8.9–10.3)
CHLORIDE: 102 mmol/L (ref 101–111)
CO2: 28 mmol/L (ref 22–32)
Creatinine, Ser: 1.23 mg/dL — ABNORMAL HIGH (ref 0.44–1.00)
GFR calc non Af Amer: 48 mL/min — ABNORMAL LOW (ref >60)
GFR, EST AFRICAN AMERICAN: 56 mL/min — AB (ref >60)
Glucose, Bld: 173 mg/dL — ABNORMAL HIGH (ref 65–99)
POTASSIUM: 4.1 mmol/L (ref 3.5–5.1)
Sodium: 137 mmol/L (ref 135–145)

## 2017-09-09 MED ORDER — MECLIZINE HCL 25 MG PO TABS
50.0000 mg | ORAL_TABLET | Freq: Once | ORAL | Status: AC
  Administered 2017-09-09: 50 mg via ORAL
  Filled 2017-09-09: qty 2

## 2017-09-09 MED ORDER — MECLIZINE HCL 25 MG PO TABS: 25.0000 mg | ORAL_TABLET | Freq: Three times a day (TID) | ORAL | 0 refills | Status: DC | PRN

## 2017-09-09 NOTE — ED Provider Notes (Signed)
[facility] Emergency Department Provider Note  ____________________________________________   First MD Initiated Contact with Patient 09/09/17 1505     (approximate)  I have reviewed the triage vital signs and the nursing notes.   HISTORY  Chief Complaint Dizziness   HPI Meghan Vasquez is a 57 y.o. female who self presents to the emergency department with vertigo for the past 3 to 4 days.  Described as room spinning.  Associated with nausea.  The symptoms are gradual onset mostly constant clearly worse with movement not completely resolved with rest.  No numbness or weakness.  No chest pain shortness of breath abdominal pain.  No history of the same.  She has tried no medications nothing seems to help.  No neck pain.  No tinnitus.  No ear pain.  Past Medical History:  Diagnosis Date  . Chronic sciatica   . Diabetes mellitus without complication ([place])   . Lumbar radiculopathy   . Sciatica    Workers Comp    Patient Active Problem List   Diagnosis Date Noted  . Spondylosis without myelopathy or radiculopathy, lumbar region 06/03/2017  . Lumbar facet hypertrophy (Multilevel) (Bilateral) 05/23/2017  . Lumbar facet syndrome (Bilateral) (R>L) 04/29/2017  . Osteoarthritis of knee (Left) 04/29/2017  . Tricompartment osteoarthritis of knee (Left) 04/29/2017  . DDD (degenerative disc disease), lumbar 04/29/2017  . Lumbar facet osteoarthritis 04/29/2017  . Osteoarthritis of  Lumbar spine 04/29/2017  . Chronic lumbar radicular pain (R) (L5) 04/29/2017  . Chronic low back pain (Primary Area of Pain) (Bilateral) (R>L) 04/17/2017  . Chronic lower extremity pain (Secondary Area of Pain) (Bilateral) (R>L) 04/17/2017  . Chronic knee pain [facility] Area of Pain) (Left) 04/17/2017  . Chronic pain syndrome 04/17/2017  . Disorder of bone, unspecified 04/17/2017  . Other specified health status 04/17/2017  . Other long term (current) drug therapy 04/17/2017  . Skin  cyst 09/05/2015  . Breast cyst 09/05/2015  . Anemia 02/01/2015  . 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 Perimenopause 01/31/2015  . 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 COLONOSCOPY  2013  . etopic pregnancy      Prior to Admission medications   Medication Sig Start Date End Date Taking? Authorizing Provider  ibuprofen (ADVIL,MOTRIN) 200 MG tablet Take 200 mg by mouth every 6 (six) hours as needed.    [provider]  meclizine (ANTIVERT) 25 MG tablet Take 1 tablet (25 mg total) by mouth 3 (three) times daily as needed for dizziness. 09/09/17   Darel Hong, MD  traMADol (ULTRAM) 50 MG tablet Take 1 tablet (50 mg total) by mouth every 6 (six) hours as needed for severe pain. 05/13/17 06/12/17  Milinda Pointer, MD    Allergies Patient has no known allergies.  Family History  Problem Relation Age of Onset  . Diabetes Mother   . Emphysema Brother   . Other Daughter        Scarcoidosis  . Aplastic anemia Daughter   . Kidney disease Daughter   . Cancer Daughter        Breast Bilateral Mastectomy age 35, kidney   . Breast cancer Cousin     Social History Social History   Tobacco Use  . Smoking status: Never Smoker  . Smokeless tobacco: Never Used  Substance Use Topics  . Alcohol use: No  . Drug use: No    Review of Systems Constitutional: No fever/chills Eyes:  No visual changes. ENT: No sore throat. Cardiovascular: Denies chest pain. Respiratory: Denies shortness of breath. Gastrointestinal: No abdominal pain.  Positive for nausea, no vomiting.  No diarrhea.  No constipation. Genitourinary: Negative for dysuria. Musculoskeletal: Negative for back pain. Skin: Negative for rash. Neurological: Positive for vertigo   ____________________________________________   PHYSICAL EXAM:  VITAL SIGNS: ED Triage Vitals [09/09/17 1149]  Enc Vitals Group     BP (!) 142/83      Pulse Rate 81     Resp 18     Temp 98.6 F (37 C)     Temp Source Oral     SpO2 99 %     Weight 222 lb (100.7 kg)     Height 5\' 3"  (1.6 m)     Head Circumference      Peak Flow      Pain Score 0     Pain Loc      Pain Edu?      Excl. in [place]?     Constitutional: Alert and oriented x4 appears uncomfortable sitting very still in bed Eyes: PERRL EOMI. rightward beating lateral fatigable nystagmus Head: Atraumatic. Normal tympanic membranes bilaterally Nose: No congestion/rhinnorhea. Mouth/Throat: No trismus Neck: No stridor.   Cardiovascular: Normal rate, regular rhythm. Grossly normal heart sounds.  Good peripheral circulation. Respiratory: Normal respiratory effort.  No retractions. Lungs CTAB and moving good air Gastrointestinal: Soft nontender Musculoskeletal: No lower extremity edema   Neurologic:  Normal speech and language. No gross focal neurologic deficits are appreciated. Normal finger-nose-finger no dysdiadochokinesis Skin:  Skin is warm, dry and intact. No rash noted. Psychiatric: Mood and affect are normal. Speech and behavior are normal.    ____________________________________________   DIFFERENTIAL includes but not limited to  Central vertigo, peripheral vertigo, stroke ____________________________________________   LABS (all labs ordered are listed, but only abnormal results are displayed)  Labs Reviewed  BASIC METABOLIC PANEL - Abnormal; Notable for the following components:      Result Value   Glucose, Bld 173 (*)    Creatinine, Ser 1.23 (*)    GFR calc non Af Amer 48 (*)    GFR calc Af Amer 56 (*)    All other components within normal limits  CBC - Abnormal; Notable for the following components:   Hemoglobin 11.7 (*)    HCT 34.7 (*)    All other components within normal limits  URINALYSIS, COMPLETE (UACMP) WITH MICROSCOPIC - Abnormal; Notable for the following components:   Color, Urine YELLOW (*)    APPearance CLEAR (*)    Bacteria, UA RARE  (*)    All other components within normal limits    Reviewed by me with no acute disease __________________________________________  EKG  ED ECG REPORT I, Darel Hong, the attending physician, personally viewed and interpreted this ECG.  Date: 09/09/2017 EKG Time:  Rate: 79 Rhythm: normal sinus rhythm QRS Axis: normal Intervals: normal ST/T Wave abnormalities: normal Narrative Interpretation: no evidence of acute ischemia  ____________________________________________  RADIOLOGY  CT the head reviewed by me with no acute disease ____________________________________________   PROCEDURES  Procedure(s) performed: no  Procedures  Critical Care performed: no  Observation: no ____________________________________________   INITIAL IMPRESSION / ASSESSMENT AND PLAN / ED COURSE  Pertinent labs & imaging results that were available during my care of the patient were reviewed by me and considered in my medical decision making (see chart for details).  The patient arrives with what sounds like peripheral vertigo although the symptoms are  constant.  Could be labyrinthitis versus a cerebellar stroke.  As her symptoms are 51 to 82 days old stroke should show on plain CT scan.  Scan is pending.  After meclizine the patient's symptoms are nearly completely resolved.  CT scan is reassuring.  At this point the patient is medically stable for outpatient management with otolaryngology follow-up.  She verbalizes understanding agreement the plan with strict return precautions.      ____________________________________________   FINAL CLINICAL IMPRESSION(S) / ED DIAGNOSES  Final diagnoses:  Peripheral vertigo involving right ear      NEW MEDICATIONS STARTED DURING THIS VISIT:  Discharge Medication List as of 09/09/2017  4:53 PM       Note:  This document was prepared using Dragon voice recognition software and may include unintentional dictation errors.     Darel Hong, MD 09/09/17 2223

## 2017-09-09 NOTE — ED Triage Notes (Signed)
FIRST NURSE NOTE- dizzy since Friday. Head injury 1 month ago.  NAD. With family.

## 2017-09-09 NOTE — Discharge Instructions (Signed)
It was a pleasure to take care of you today, and thank you for coming to our emergency department.  If you have any questions or concerns before leaving please ask the nurse to grab me and I'm more than happy to go through your aftercare instructions again.  If you were prescribed any opioid pain medication today such as Norco, Vicodin, Percocet, morphine, hydrocodone, or oxycodone please make sure you do not drive when you are taking this medication as it can alter your ability to drive safely.  If you have any concerns once you are home that you are not improving or are in fact getting worse before you can make it to your follow-up appointment, please do not hesitate to call 911 and come back for further evaluation.  Darel Hong, MD  Results for orders placed or performed during the hospital encounter of 24/58/09  Basic metabolic panel  Result Value Ref Range   Sodium 137 135 - 145 mmol/L   Potassium 4.1 3.5 - 5.1 mmol/L   Chloride 102 101 - 111 mmol/L   CO2 28 22 - 32 mmol/L   Glucose, Bld 173 (H) 65 - 99 mg/dL   BUN 20 6 - 20 mg/dL   Creatinine, Ser 1.23 (H) 0.44 - 1.00 mg/dL   Calcium 9.5 8.9 - 10.3 mg/dL   GFR calc non Af Amer 48 (L) >60 mL/min   GFR calc Af Amer 56 (L) >60 mL/min   Anion gap 7 5 - 15  CBC  Result Value Ref Range   WBC 8.1 3.6 - 11.0 K/uL   RBC 3.90 3.80 - 5.20 MIL/uL   Hemoglobin 11.7 (L) 12.0 - 16.0 g/dL   HCT 34.7 (L) 35.0 - 47.0 %   MCV 89.1 80.0 - 100.0 fL   MCH 29.9 26.0 - 34.0 pg   MCHC 33.6 32.0 - 36.0 g/dL   RDW 14.4 11.5 - 14.5 %   Platelets 276 150 - 440 K/uL  Urinalysis, Complete w Microscopic  Result Value Ref Range   Color, Urine YELLOW (A) YELLOW   APPearance CLEAR (A) CLEAR   Specific Gravity, Urine 1.018 1.005 - 1.030   pH 5.0 5.0 - 8.0   Glucose, UA NEGATIVE NEGATIVE mg/dL   Hgb urine dipstick NEGATIVE NEGATIVE   Bilirubin Urine NEGATIVE NEGATIVE   Ketones, ur NEGATIVE NEGATIVE mg/dL   Protein, ur NEGATIVE NEGATIVE mg/dL   Nitrite NEGATIVE NEGATIVE   Leukocytes, UA NEGATIVE NEGATIVE   RBC / HPF 0-5 0 - 5 RBC/hpf   WBC, UA 0-5 0 - 5 WBC/hpf   Bacteria, UA RARE (A) NONE SEEN   Squamous Epithelial / LPF 0-5 0 - 5   Mucus PRESENT    Ct Head Wo Contrast  Result Date: 09/09/2017 CLINICAL DATA:  Dizziness. EXAM: CT HEAD WITHOUT CONTRAST TECHNIQUE: Contiguous axial images were obtained from the base of the skull through the vertex without intravenous contrast. COMPARISON:  08/09/2017 FINDINGS: Brain: No evidence of acute infarction, hemorrhage, hydrocephalus, extra-axial collection or mass lesion/mass effect. Vascular: No hyperdense vessel or unexpected calcification. Skull: Normal. Negative for fracture or focal lesion. Sinuses/Orbits: No acute finding. Stable deformity of the medial wall of the right orbit. Other: None. IMPRESSION: No acute intracranial abnormality. Electronically Signed   By: Fidela Salisbury M.D.   On: 09/09/2017 15:36

## 2017-09-09 NOTE — ED Notes (Signed)
09/09/17 1856 late note:  Pt left without prescription. Called pt and left message to call ed. Prescription in envelope at first nurse desk.

## 2017-09-09 NOTE — ED Notes (Signed)
Pt arrived via POV from home with complaints of dizziness. Pt states that she has been dizzy for multiple days and has been trying to vomit, but unable to do so. Pt states that she had a head injury about a month ago.

## 2017-09-09 NOTE — ED Triage Notes (Addendum)
Pt comes into the ED via POV c/o dizziness that has been ongoing since Friday.  Patient states that the room is actually spinning when she stands or opens her eyes.  Patient denies any weakness, slurred speech, and the patient is currently neurologically intact.  Patient in NAD at this time with even and unlabored respirations. Patient has h/o vertigo and states this feels the same.

## 2017-09-10 ENCOUNTER — Telehealth: Payer: Self-pay | Admitting: Emergency Medicine

## 2017-09-10 NOTE — Telephone Encounter (Signed)
Patient called because she says she did not get her prescription.  She checked through discharge papers and does not see it.  I called her rx for meclizine to [facility].  She also is having trouble getting through to [facility]. Says it goes to [facility].  I asked her to keep trying, as there was just a holiday weekend.

## 2017-09-11 ENCOUNTER — Telehealth: Payer: Self-pay | Admitting: Pain Medicine

## 2017-09-11 NOTE — Telephone Encounter (Signed)
Patient called wanting to come in for procedure. Informed her physician is out until next week. Gave her choice of Tues at 9:15 or thurs at 8:30. She was extremely upset. Wanted to know about changing physicians. Stated her meds are not helping. Took appt for Thurs as she has another appt on Tues.

## 2017-09-19 ENCOUNTER — Encounter: Payer: Self-pay | Admitting: Pain Medicine

## 2017-09-19 ENCOUNTER — Ambulatory Visit
Admission: RE | Admit: 2017-09-19 | Discharge: 2017-09-19 | Disposition: A | Discharge status: Home / Self Care | Payer: BLUE CROSS/BLUE SHIELD | Source: Ambulatory Visit | Attending: Pain Medicine | Admitting: Pain Medicine

## 2017-09-19 ENCOUNTER — Other Ambulatory Visit: Payer: Self-pay

## 2017-09-19 ENCOUNTER — Ambulatory Visit (HOSPITAL_BASED_OUTPATIENT_CLINIC_OR_DEPARTMENT_OTHER): Payer: BLUE CROSS/BLUE SHIELD | Admitting: Pain Medicine

## 2017-09-19 VITALS — BP 144/92 | HR 80 | Temp 98.4°F | Resp 17 | Ht 63.0 in | Wt 220.0 lb

## 2017-09-19 DIAGNOSIS — M8938 Hypertrophy of bone, other site: Secondary | ICD-10-CM | POA: Diagnosis not present

## 2017-09-19 DIAGNOSIS — G8929 Other chronic pain: Secondary | ICD-10-CM | POA: Diagnosis not present

## 2017-09-19 DIAGNOSIS — M47816 Spondylosis without myelopathy or radiculopathy, lumbar region: Secondary | ICD-10-CM

## 2017-09-19 DIAGNOSIS — M47896 Other spondylosis, lumbar region: Secondary | ICD-10-CM | POA: Insufficient documentation

## 2017-09-19 DIAGNOSIS — M5442 Lumbago with sciatica, left side: Secondary | ICD-10-CM

## 2017-09-19 DIAGNOSIS — M545 Low back pain: Secondary | ICD-10-CM | POA: Insufficient documentation

## 2017-09-19 DIAGNOSIS — M5441 Lumbago with sciatica, right side: Secondary | ICD-10-CM

## 2017-09-19 MED ORDER — LIDOCAINE HCL 2 % IJ SOLN
20.0000 mL | Freq: Once | INTRAMUSCULAR | Status: AC
  Administered 2017-09-19: 400 mg

## 2017-09-19 MED ORDER — MIDAZOLAM HCL 5 MG/5ML IJ SOLN
INTRAMUSCULAR | Status: AC
  Filled 2017-09-19: qty 5

## 2017-09-19 MED ORDER — MIDAZOLAM HCL 5 MG/5ML IJ SOLN
1.0000 mg | INTRAMUSCULAR | Status: DC | PRN
  Administered 2017-09-19: 3 mg via INTRAVENOUS

## 2017-09-19 MED ORDER — ROPIVACAINE HCL 2 MG/ML IJ SOLN
INTRAMUSCULAR | Status: AC
  Filled 2017-09-19: qty 10

## 2017-09-19 MED ORDER — FENTANYL CITRATE (PF) 100 MCG/2ML IJ SOLN
INTRAMUSCULAR | Status: AC
  Filled 2017-09-19: qty 2

## 2017-09-19 MED ORDER — LACTATED RINGERS IV SOLN
1000.0000 mL | Freq: Once | INTRAVENOUS | Status: AC
Start: 2017-09-19 — End: 2017-09-19
  Administered 2017-09-19: 1000 mL via INTRAVENOUS

## 2017-09-19 MED ORDER — TRIAMCINOLONE ACETONIDE 40 MG/ML IJ SUSP
INTRAMUSCULAR | Status: AC
  Filled 2017-09-19: qty 1

## 2017-09-19 MED ORDER — TRIAMCINOLONE ACETONIDE 40 MG/ML IJ SUSP
40.0000 mg | Freq: Once | INTRAMUSCULAR | Status: AC
  Administered 2017-09-19: 40 mg

## 2017-09-19 MED ORDER — LIDOCAINE HCL 2 % IJ SOLN
INTRAMUSCULAR | Status: AC
  Filled 2017-09-19: qty 20

## 2017-09-19 MED ORDER — ROPIVACAINE HCL 2 MG/ML IJ SOLN
9.0000 mL | Freq: Once | INTRAMUSCULAR | Status: AC
  Administered 2017-09-19: 10 mL via PERINEURAL

## 2017-09-19 MED ORDER — FENTANYL CITRATE (PF) 100 MCG/2ML IJ SOLN
25.0000 ug | INTRAMUSCULAR | Status: DC | PRN
  Administered 2017-09-19: 50 ug via INTRAVENOUS

## 2017-09-19 NOTE — Patient Instructions (Addendum)
BMI interpretation table:        BMI level Category Range association with higher incidence of chronic pain  <18 kg/m2 Underweight    18.5-24.9 kg/m2 Ideal body weight    25-29.9 kg/m2 Overweight Increased incidence by 20%   30-34.9 kg/m2 Obese (Class I) Increased incidence by 68%   35-39.9 kg/m2 Severe obesity (Class II) Increased incidence by 136%   >40 kg/m2 Extreme obesity (Class III) Increased incidence by 254%       BMI Readings from Last 4 Encounters:  06/10/17 39.68 kg/m  06/04/17 39.68 kg/m  05/13/17 39.68 kg/m  04/29/17 39.68 kg/m      Wt Readings from Last 4 Encounters:  06/10/17 224 lb (101.6 kg)  06/04/17 224 lb (101.6 kg)  05/13/17 224 lb (101.6 kg)  04/29/17 224 lb (101.6 kg)   ____________________________________________________________________________________________  Pain Prevention Technique  Definition:   A technique used to minimize the effects of an activity known to cause inflammation or swelling, which in turn leads to an increase in pain.  Purpose: To prevent swelling from occurring. It is based on the fact that it is easier to prevent swelling from happening than it is to get rid of it, once it occurs.  Contraindications: 1. Anyone with allergy or hypersensitivity to the recommended medications. 2. Anyone taking anticoagulants (Blood Thinners) (e.g., Coumadin, Warfarin, Plavix, etc.). 3. Patients in Renal Failure.  Technique: Before you undertake an activity known to cause pain, or a flare-up of your chronic pain, and before you experience any pain, do the following:  1. On a full stomach, take 4 (four) over the counter Ibuprofens 200mg  tablets (Motrin), for a total of 800 mg. 2. In addition, take over the counter Magnesium 400 to 500 mg, before doing the activity.  3. Six (6) hours later, again on a full stomach, repeat the Ibuprofen. 4. That night, take a warm shower and stretch under the running warm water.  This technique may be  sufficient to abort the pain and discomfort before it happens. Keep in mind that it takes a lot less medication to prevent swelling than it takes to eliminate it once it occurs.  ____________________________________________________________________________________________  ____________________________________________________________________________________________  Post-Procedure Discharge Instructions  Instructions:  Apply ice: Fill a plastic sandwich bag with crushed ice. Cover it with a small towel and apply to injection site. Apply for 15 minutes then remove x 15 minutes. Repeat sequence on day of procedure, until you go to bed. The purpose is to minimize swelling and discomfort after procedure.  Apply heat: Apply heat to procedure site starting the day following the procedure. The purpose is to treat any soreness and discomfort from the procedure.  Food intake: Start with clear liquids (like water) and advance to regular food, as tolerated.   Physical activities: Keep activities to a minimum for the first 8 hours after the procedure.   Driving: If you have received any sedation, you are not allowed to drive for 24 hours after your procedure.  Blood thinner: Restart your blood thinner 6 hours after your procedure. (Only for those taking blood thinners)  Insulin: As soon as you can eat, you may resume your normal dosing schedule. (Only for those taking insulin)  Infection prevention: Keep procedure site clean and dry.  Post-procedure Pain Diary: Extremely important that this be done correctly and accurately. Recorded information will be used to determine the next step in treatment.  Pain evaluated is that of treated area only. Do not include pain from an untreated area.  Complete every hour, on the hour, for the initial 8 hours. Set an alarm to help you do this part accurately.  Do not go to sleep and have it completed later. It will not be accurate.  Follow-up appointment: Keep  your follow-up appointment after the procedure. Usually 2 weeks for most procedures. (6 weeks in the case of radiofrequency.) Bring you pain diary.   Expect:  From numbing medicine (AKA: Local Anesthetics): Numbness or decrease in pain.  Onset: Full effect within 15 minutes of injected.  Duration: It will depend on the type of local anesthetic used. On the average, 1 to 8 hours.   From steroids: Decrease in swelling or inflammation. Once inflammation is improved, relief of the pain will follow.  Onset of benefits: Depends on the amount of swelling present. The more swelling, the longer it will take for the benefits to be seen. In some cases, up to 10 days.  Duration: Steroids will stay in the system x 2 weeks. Duration of benefits will depend on multiple posibilities including persistent irritating factors.  From procedure: Some discomfort is to be expected once the numbing medicine wears off. This should be minimal if ice and heat are applied as instructed.  Call if:  You experience numbness and weakness that gets worse with time, as opposed to wearing off.  New onset bowel or bladder incontinence. (This applies to Spinal procedures only)  Emergency Numbers:  [place] business hours (Monday - Thursday, 8:00 AM - 4:00 PM) (Friday, 9:00 AM - 12:00 Noon): [phone]  After hours: [phone] ____________________________________________________________________________________________

## 2017-09-19 NOTE — Progress Notes (Signed)
Patient's Name: Meghan Vasquez  MRN: [id number]  Referring Provider: Donnie Coffin, MD  DOB: 1960/04/22  PCP: Donnie Coffin, MD  DOS: 09/19/2017  Note by: Gaspar Cola, MD  Service setting: Ambulatory outpatient  Specialty: Interventional Pain Management  Patient type: Established  Location: [facility] (AMB) Pain Management Facility  Visit type: Interventional Procedure   Primary Reason for Visit: Interventional Pain Management Treatment. CC: Back Pain (low right)  Procedure:       Anesthesia, Analgesia, Anxiolysis:  Type: Lumbar Facet, Medial Branch Block(s) #2  Primary Purpose: Diagnostic Region: Posterolateral Lumbosacral Spine Level: L2, L3, L4, L5, & S1 Medial Branch Level(s). Injecting these levels blocks the L3-4, L4-5, and L5-S1 lumbar facet joints. Laterality: Right  Type: Moderate (Conscious) Sedation combined with Local Anesthesia Indication(s): Analgesia and Anxiety Route: Intravenous (IV) IV Access: Secured Sedation: Meaningful verbal contact was maintained at all times during the procedure  Local Anesthetic: Lidocaine 1-2%   Indications: 1. Spondylosis without myelopathy or radiculopathy, lumbar region   2. Lumbar facet syndrome (Bilateral) (R>L)   3. Lumbar facet osteoarthritis   4. Lumbar facet hypertrophy (Multilevel) (Bilateral)   5. Chronic low back pain (Primary Area of Pain) (Bilateral) (R>L)    Pain Score: Pre-procedure: 5 /10 Post-procedure: 0-No pain/10  Pre-op Assessment:  [name] is a 57 y.o. (year old), female patient, seen today for interventional treatment. She  has a past surgical history that includes etopic pregnancy and Colonoscopy (2013). [name] has a current medication list which includes the following prescription(s): ibuprofen, meclizine, metformin, tramadol, cyclobenzaprine, meloxicam, and tizanidine, and the following Facility-Administered Medications: fentanyl and midazolam. Her primarily concern today is the Back Pain (low  right)  Today I took time to explain to the patient and her daughter (over the phone) what the treatment plan was. In addition, I provided some recommendations with regards to the patient's weight and how it was influencing the patient's condition. I also explained about the mechanism of action and we talked about realistic expectations.  Initial Vital Signs:  Pulse/HCG Rate: 76ECG Heart Rate: 79 Temp: 98.4 F (36.9 C) Resp: 19 BP: 130/77 SpO2: 100 %  BMI: Estimated body mass index is 38.97 kg/m as calculated from the following:   Height as of this encounter: 5\' 3"  (1.6 m).   Weight as of this encounter: 220 lb (99.8 kg).  Risk Assessment: Allergies: Reviewed. She has No Known Allergies.  Allergy Precautions: None required Coagulopathies: Reviewed. None identified.  Blood-thinner therapy: None at this time Active Infection(s): Reviewed. None identified. [name] is afebrile  Site Confirmation: [name] was asked to confirm the procedure and laterality before marking the site Procedure checklist: Completed Consent: Before the procedure and under the influence of no sedative(s), amnesic(s), or anxiolytics, the patient was informed of the treatment options, risks and possible complications. To fulfill our ethical and legal obligations, as recommended by the American Medical Association's Code of Ethics, I have informed the patient of my clinical impression; the nature and purpose of the treatment or procedure; the risks, benefits, and possible complications of the intervention; the alternatives, including doing nothing; the risk(s) and benefit(s) of the alternative treatment(s) or procedure(s); and the risk(s) and benefit(s) of doing nothing. The patient was provided information about the general risks and possible complications associated with the procedure. These may include, but are not limited to: failure to achieve desired goals, infection, bleeding, organ or nerve damage, allergic  reactions, paralysis, and death. In addition, the patient was informed of  those risks and complications associated to Spine-related procedures, such as failure to decrease pain; infection (i.e.: Meningitis, epidural or intraspinal abscess); bleeding (i.e.: epidural hematoma, subarachnoid hemorrhage, or any other type of intraspinal or peri-dural bleeding); organ or nerve damage (i.e.: Any type of peripheral nerve, nerve root, or spinal cord injury) with subsequent damage to sensory, motor, and/or autonomic systems, resulting in permanent pain, numbness, and/or weakness of one or several areas of the body; allergic reactions; (i.e.: anaphylactic reaction); and/or death. Furthermore, the patient was informed of those risks and complications associated with the medications. These include, but are not limited to: allergic reactions (i.e.: anaphylactic or anaphylactoid reaction(s)); adrenal axis suppression; blood sugar elevation that in diabetics may result in ketoacidosis or comma; water retention that in patients with history of congestive heart failure may result in shortness of breath, pulmonary edema, and decompensation with resultant heart failure; weight gain; swelling or edema; medication-induced neural toxicity; particulate matter embolism and blood vessel occlusion with resultant organ, and/or nervous system infarction; and/or aseptic necrosis of one or more joints. Finally, the patient was informed that Medicine is not an exact science; therefore, there is also the possibility of unforeseen or unpredictable risks and/or possible complications that may result in a catastrophic outcome. The patient indicated having understood very clearly. We have given the patient no guarantees and we have made no promises. Enough time was given to the patient to ask questions, all of which were answered to the patient's satisfaction. [name] has indicated that she wanted to continue with the procedure. Attestation: I,  the ordering provider, attest that I have discussed with the patient the benefits, risks, side-effects, alternatives, likelihood of achieving goals, and potential problems during recovery for the procedure that I have provided informed consent. Date  Time: 09/19/2017  8:10 AM  Pre-Procedure Preparation:  Monitoring: As per clinic protocol. Respiration, ETCO2, SpO2, BP, heart rate and rhythm monitor placed and checked for adequate function Safety Precautions: Patient was assessed for positional comfort and pressure points before starting the procedure. Time-out: I initiated and conducted the "Time-out" before starting the procedure, as per protocol. The patient was asked to participate by confirming the accuracy of the "Time Out" information. Verification of the correct person, site, and procedure were performed and confirmed by me, the nursing staff, and the patient. "Time-out" conducted as per Joint Commission's Universal Protocol (UP.01.01.01). Time: 0904  Description of Procedure:       Position: Prone Laterality: Right Levels:  L2, L3, L4, L5, & S1 Medial Branch Level(s) Area Prepped: Posterior Lumbosacral Region Prepping solution: ChloraPrep (2% chlorhexidine gluconate and 70% isopropyl alcohol) Safety Precautions: Aspiration looking for blood return was conducted prior to all injections. At no point did we inject any substances, as a needle was being advanced. Before injecting, the patient was told to immediately notify me if she was experiencing any new onset of "ringing in the ears, or metallic taste in the mouth". No attempts were made at seeking any paresthesias. Safe injection practices and needle disposal techniques used. Medications properly checked for expiration dates. SDV (single dose vial) medications used. After the completion of the procedure, all disposable equipment used was discarded in the proper designated medical waste containers. Local Anesthesia: Protocol guidelines were  followed. The patient was positioned over the fluoroscopy table. The area was prepped in the usual manner. The time-out was completed. The target area was identified using fluoroscopy. A 12-in long, straight, sterile hemostat was used with fluoroscopic guidance to locate the targets for  each level blocked. Once located, the skin was marked with an approved surgical skin marker. Once all sites were marked, the skin (epidermis, dermis, and hypodermis), as well as deeper tissues (fat, connective tissue and muscle) were infiltrated with a small amount of a short-acting local anesthetic, loaded on a 10cc syringe with a 25G, 1.5-in  Needle. An appropriate amount of time was allowed for local anesthetics to take effect before proceeding to the next step. Local Anesthetic: Lidocaine 2.0% The unused portion of the local anesthetic was discarded in the proper designated containers. Technical explanation of process:  L2 Medial Branch Nerve Block (MBB): The target area for the L2 medial branch is at the junction of the postero-lateral aspect of the superior articular process and the superior, posterior, and medial edge of the transverse process of L3. Under fluoroscopic guidance, a Quincke needle was inserted until contact was made with os over the superior postero-lateral aspect of the pedicular shadow (target area). After negative aspiration for blood, 0.5 mL of the nerve block solution was injected without difficulty or complication. The needle was removed intact. L3 Medial Branch Nerve Block (MBB): The target area for the L3 medial branch is at the junction of the postero-lateral aspect of the superior articular process and the superior, posterior, and medial edge of the transverse process of L4. Under fluoroscopic guidance, a Quincke needle was inserted until contact was made with os over the superior postero-lateral aspect of the pedicular shadow (target area). After negative aspiration for blood, 0.5 mL of the nerve  block solution was injected without difficulty or complication. The needle was removed intact. L4 Medial Branch Nerve Block (MBB): The target area for the L4 medial branch is at the junction of the postero-lateral aspect of the superior articular process and the superior, posterior, and medial edge of the transverse process of L5. Under fluoroscopic guidance, a Quincke needle was inserted until contact was made with os over the superior postero-lateral aspect of the pedicular shadow (target area). After negative aspiration for blood, 0.5 mL of the nerve block solution was injected without difficulty or complication. The needle was removed intact. L5 Medial Branch Nerve Block (MBB): The target area for the L5 medial branch is at the junction of the postero-lateral aspect of the superior articular process and the superior, posterior, and medial edge of the sacral ala. Under fluoroscopic guidance, a Quincke needle was inserted until contact was made with os over the superior postero-lateral aspect of the pedicular shadow (target area). After negative aspiration for blood, 0.5 mL of the nerve block solution was injected without difficulty or complication. The needle was removed intact. S1 Medial Branch Nerve Block (MBB): The target area for the S1 medial branch is at the posterior and inferior 6 o'clock position of the L5-S1 facet joint. Under fluoroscopic guidance, the Quincke needle inserted for the L5 MBB was redirected until contact was made with os over the inferior and postero aspect of the sacrum, at the 6 o' clock position under the L5-S1 facet joint (Target area). After negative aspiration for blood, 0.5 mL of the nerve block solution was injected without difficulty or complication. The needle was removed intact. Procedural Needles: 22-gauge, 3.5-inch, Quincke needles used for all levels. Nerve block solution: 0.2% PF-Ropivacaine + Triamcinolone (40 mg/mL) diluted to a final concentration of 4 mg of  Triamcinolone/mL of Ropivacaine The unused portion of the solution was discarded in the proper designated containers.  Once the entire procedure was completed, the treated area was cleaned,  making sure to leave some of the prepping solution back to take advantage of its long term bactericidal properties.   Illustration of the posterior view of the lumbar spine and the posterior neural structures. Laminae of L2 through S1 are labeled. DPRL5, dorsal primary ramus of L5; DPRS1, dorsal primary ramus of S1; DPR3, dorsal primary ramus of L3; FJ, facet (zygapophyseal) joint L3-L4; I, inferior articular process of L4; LB1, lateral branch of dorsal primary ramus of L1; IAB, inferior articular branches from L3 medial branch (supplies L4-L5 facet joint); IBP, intermediate branch plexus; MB3, medial branch of dorsal primary ramus of L3; NR3, third lumbar nerve root; S, superior articular process of L5; SAB, superior articular branches from L4 (supplies L4-5 facet joint also); TP3, transverse process of L3.  Vitals:   09/19/17 0915 09/19/17 0920 09/19/17 0930 09/19/17 0940  BP: (!) 130/91 130/83 137/85 (!) 144/92  Pulse:      Resp: 13 17 14 17   Temp: 98.4 F (36.9 C)     TempSrc: Temporal     SpO2: 99% 99% 98% 100%  Weight:      Height:        Start Time: 0905 hrs. End Time: 0910 hrs.  Imaging Guidance (Spinal):  Type of Imaging Technique: Fluoroscopy Guidance (Spinal) Indication(s): Assistance in needle guidance and placement for procedures requiring needle placement in or near specific anatomical locations not easily accessible without such assistance. Exposure Time: Please see nurses notes. Contrast: None used. Fluoroscopic Guidance: I was personally present during the use of fluoroscopy. "Tunnel Vision Technique" used to obtain the best possible view of the target area. Parallax error corrected before commencing the procedure. "Direction-depth-direction" technique used to introduce the needle under  continuous pulsed fluoroscopy. Once target was reached, antero-posterior, oblique, and lateral fluoroscopic projection used confirm needle placement in all planes. Images permanently stored in EMR. Interpretation: No contrast injected. I personally interpreted the imaging intraoperatively. Adequate needle placement confirmed in multiple planes. Permanent images saved into the patient's record.  Antibiotic Prophylaxis:   Anti-infectives (From admission, onward)   None     Indication(s): None identified  Post-operative Assessment:  Post-procedure Vital Signs:  Pulse/HCG Rate: 8087 Temp: 98.4 F (36.9 C) Resp: 17 BP: (!) 144/92 SpO2: 100 %  EBL: None  Complications: No immediate post-treatment complications observed by team, or reported by patient.  Note: The patient tolerated the entire procedure well. A repeat set of vitals were taken after the procedure and the patient was kept under observation following institutional policy, for this type of procedure. Post-procedural neurological assessment was performed, showing return to baseline, prior to discharge. The patient was provided with post-procedure discharge instructions, including a section on how to identify potential problems. Should any problems arise concerning this procedure, the patient was given instructions to immediately contact us, at any time, without hesitation. In any case, we plan to contact the patient by telephone for a follow-up status report regarding this interventional procedure.  Comments:  No additional relevant information.  Plan of Care    Imaging Orders     DG C-Arm 1-60 Min-No Report  Procedure Orders     LUMBAR FACET(MEDIAL BRANCH NERVE BLOCK) MBNB     LUMBAR FACET(MEDIAL BRANCH NERVE BLOCK) MBNB  Medications ordered for procedure: Meds ordered this encounter  Medications  . lidocaine (XYLOCAINE) 2 % (with pres) injection 400 mg  . midazolam (VERSED) 5 MG/5ML injection 1-2 mg    Make sure  Flumazenil is available in the pyxis when using this  medication. If oversedation occurs, administer 0.2 mg IV over 15 sec. If after 45 sec no response, administer 0.2 mg again over 1 min; may repeat at 1 min intervals; not to exceed 4 doses (1 mg)  . fentaNYL (SUBLIMAZE) injection 25-50 mcg    Make sure Narcan is available in the pyxis when using this medication. In the event of respiratory depression (RR< 8/min): Titrate NARCAN (naloxone) in increments of 0.1 to 0.2 mg IV at 2-3 minute intervals, until desired degree of reversal.  . lactated ringers infusion 1,000 mL  . ropivacaine (PF) 2 mg/mL (0.2%) (NAROPIN) injection 9 mL  . triamcinolone acetonide (KENALOG-40) injection 40 mg   Medications administered: We administered lidocaine, midazolam, fentaNYL, lactated ringers, ropivacaine (PF) 2 mg/mL (0.2%), and triamcinolone acetonide.  See the medical record for exact dosing, route, and time of administration.  New Prescriptions   No medications on file   Disposition: Discharge home  Discharge Date & Time: 09/19/2017; 0945 hrs.   Physician-requested Follow-up: Return for post-procedure eval (2 wks), w/ Dr. Dossie Arbour.  Future Appointments  Date Time Provider [place]  10/02/2017  8:15 AM Milinda Pointer, MD [facility] None   Primary Care Physician: Donnie Coffin, MD Location: [facility] Outpatient [facility] Facility Note by: Gaspar Cola, MD Date: 09/19/2017; Time: 10:02 AM  Disclaimer:  Medicine is not an Chief Strategy Officer. The only guarantee in medicine is that nothing is guaranteed. It is important to note that the decision to proceed with this intervention was based on the information collected from the patient. The Data and conclusions were drawn from the patient's questionnaire, the interview, and the physical examination. Because the information was provided in large part by the patient, it cannot be guaranteed that it has not been purposely or unconsciously manipulated.  Every effort has been made to obtain as much relevant data as possible for this evaluation. It is important to note that the conclusions that lead to this procedure are derived in large part from the available data. Always take into account that the treatment will also be dependent on availability of resources and existing treatment guidelines, considered by other Pain Management Practitioners as being common knowledge and practice, at the time of the intervention. For Medico-Legal purposes, it is also important to point out that variation in procedural techniques and pharmacological choices are the acceptable norm. The indications, contraindications, technique, and results of the above procedure should only be interpreted and judged by a Board-Certified Interventional Pain Specialist with extensive familiarity and expertise in the same exact procedure and technique.

## 2017-09-19 NOTE — Progress Notes (Signed)
Safety precautions to be maintained throughout the outpatient stay will include: orient to surroundings, keep bed in low position, maintain call bell within reach at all times, provide assistance with transfer out of bed and ambulation.  

## 2017-09-20 ENCOUNTER — Telehealth: Payer: Self-pay

## 2017-09-20 NOTE — Telephone Encounter (Signed)
Post procedure phone call.  Left message.  

## 2017-10-01 NOTE — Progress Notes (Deleted)
Patient's Name: Meghan Vasquez  MRN: [id number]  Referring Provider: Donnie Coffin, MD  DOB: [birth year]  PCP: Donnie Coffin, MD  DOS: 10/02/2017  Note by: Gaspar Cola, MD  Service setting: Ambulatory outpatient  Specialty: Interventional Pain Management  Location: [facility] (AMB) Pain Management Facility    Patient type: Established   Primary Reason(s) for Visit: Encounter for post-procedure evaluation of chronic illness with mild to moderate exacerbation CC: No chief complaint on file.  HPI  [name] is a 57 y.o. year old, female patient, who comes today for a post-procedure evaluation. 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[place]); Infertility of tubal origin; Perimenopause; Anemia; Skin cyst; Breast cyst; Chronic low back pain (Primary Area of Pain) (Bilateral) (R>L); Chronic lower extremity pain (Secondary Area of Pain) (Bilateral) (R>L); Chronic knee pain [facility] Area of Pain) (Left); Chronic pain syndrome; Disorder of bone, unspecified; Other specified health status; Other long term (current) drug therapy; Lumbar facet syndrome (Bilateral) (R>L); Osteoarthritis of knee (Left); Tricompartment osteoarthritis of knee (Left); DDD (degenerative disc disease), lumbar; Lumbar facet osteoarthritis; Osteoarthritis of  Lumbar spine; Chronic lumbar radicular pain (R) (L5); Lumbar facet hypertrophy (Multilevel) (Bilateral); and Spondylosis without myelopathy or radiculopathy, lumbar region on their problem list. Her primarily concern today is the No chief complaint on file.  Pain Assessment: Location:     Radiating:   Onset:   Duration:   Quality:   Severity:  /10 (subjective, self-reported pain score)  Note: Reported level is compatible with observation.                         When using our objective Pain Scale, levels between 6 and 10/10 are said to belong in an emergency room, as it progressively worsens from a 6/10, described as severely limiting, requiring emergency  care not usually available at an outpatient pain management facility. At a 6/10 level, communication becomes difficult and requires great effort. Assistance to reach the emergency department may be required. Facial flushing and profuse sweating along with potentially dangerous increases in heart rate and blood pressure will be evident. Effect on ADL:   Timing:   Modifying factors:   BP:    HR:    [name] comes in today for post-procedure evaluation after the treatment done on 09/20/2017.  Further details on both, my assessment(s), as well as the proposed treatment plan, please see below.  Post-Procedure Assessment  09/19/2017 Procedure: Diagnostic right-sided lumbar facet block #2 under fluoroscopic guidance and IV sedation Pre-procedure pain score:  5/10 Post-procedure pain score: 0/10 (100% relief) Influential Factors: BMI:   Intra-procedural challenges: None observed.         Assessment challenges: None detected.              Reported side-effects: None.        Post-procedural adverse reactions or complications: None reported         Sedation: Sedation provided. When no sedatives are used, the analgesic levels obtained are directly associated to the effectiveness of the local anesthetics. However, when sedation is provided, the level of analgesia obtained during the initial 1 hour following the intervention, is believed to be the result of a combination of factors. These factors may include, but are not limited to: 1. The effectiveness of the local anesthetics used. 2. The effects of the analgesic(s) and/or anxiolytic(s) used. 3. The degree of discomfort experienced by the patient at the time of the procedure. 4. The  patients ability and reliability in recalling and recording the events. 5. The presence and influence of possible secondary gains and/or psychosocial factors. Reported result: Relief experienced during the 1st hour after the procedure:   (Ultra-Short Term Relief)             Interpretative annotation: Clinically appropriate result. Analgesia during this period is likely to be Local Anesthetic and/or IV Sedative (Analgesic/Anxiolytic) related.          Effects of local anesthetic: The analgesic effects attained during this period are directly associated to the localized infiltration of local anesthetics and therefore cary significant diagnostic value as to the etiological location, or anatomical origin, of the pain. Expected duration of relief is directly dependent on the pharmacodynamics of the local anesthetic used. Long-acting (4-6 hours) anesthetics used.  Reported result: Relief during the next 4 to 6 hour after the procedure:   (Short-Term Relief)            Interpretative annotation: Clinically appropriate result. Analgesia during this period is likely to be Local Anesthetic-related.          Long-term benefit: Defined as the period of time past the expected duration of local anesthetics (1 hour for short-acting and 4-6 hours for long-acting). With the possible exception of prolonged sympathetic blockade from the local anesthetics, benefits during this period are typically attributed to, or associated with, other factors such as analgesic sensory neuropraxia, antiinflammatory effects, or beneficial biochemical changes provided by agents other than the local anesthetics.  Reported result: Extended relief following procedure:   (Long-Term Relief)            Interpretative annotation: Clinically appropriate result. Good relief. No permanent benefit expected. Inflammation plays a part in the etiology to the pain.          Current benefits: Defined as reported results that persistent at this point in time.   Analgesia: *** %            Function: Somewhat improved ROM: Somewhat improved Interpretative annotation: Recurrence of symptoms. No permanent benefit expected. Effective diagnostic intervention.          Interpretation: Results would suggest a successful  diagnostic intervention. We'll proceed with the next treatment, as soon as convenient [name] indicates having had an unsuccessful trial of physical therapy, which she described as non-beneficial and painful.  Plan:  Proceed with Radiofrequency Ablation for the purpose of attaining long-term benefits.       "The patient has failed to respond to conservative therapies including over-the-counter medications, anti-inflammatories, muscle relaxants, membrane stabilizers, opioids, physical therapy modalities such as heat and ice, as well as more invasive techniques such as nerve blocks. Because Ms. Foster did attain more than 50% relief of the pain during a series of diagnostic blocks conducted in separate occasions, I believe it is medically necessary to proceed with Radiofrequency Ablation, in order to attempt gaining longer relief.  Laboratory Chemistry  Inflammation Markers (CRP: Acute Phase) (ESR: Chronic Phase) Lab Results  Component Value Date   CRP 6.4 (H) 04/17/2017   ESRSEDRATE CANCELED 04/17/2017                         Renal Markers Lab Results  Component Value Date   BUN 20 09/09/2017   CREATININE 1.23 (H) 09/09/2017   BCR 14 04/17/2017   GFRAA 56 (L) 09/09/2017   GFRNONAA 48 (L) 09/09/2017  Hepatic Markers Lab Results  Component Value Date   AST 14 04/17/2017   ALT 18 07/01/2015   ALBUMIN 4.6 04/17/2017                        Neuropathy Markers Lab Results  Component Value Date   HIV Non Reactive 01/31/2015                        Hematology Parameters Lab Results  Component Value Date   PLT 276 09/09/2017   HGB 11.7 (L) 09/09/2017   HCT 34.7 (L) 09/09/2017                        Note: Lab results reviewed.  Recent Diagnostic Imaging Results  DG C-Arm 1-60 Min-No Report Fluoroscopy was utilized by the requesting physician.  No radiographic  interpretation.   Complexity Note: I personally reviewed the fluoroscopic imaging of the  procedure.                        Meds   Current Outpatient Medications:  .  cyclobenzaprine (FLEXERIL) 10 MG tablet, Take by mouth., Disp: , Rfl:  .  ibuprofen (ADVIL,MOTRIN) 200 MG tablet, Take 200 mg by mouth every 6 (six) hours as needed., Disp: , Rfl:  .  meclizine (ANTIVERT) 25 MG tablet, Take 1 tablet (25 mg total) by mouth 3 (three) times daily as needed for dizziness., Disp: 30 tablet, Rfl: 0 .  meloxicam (MOBIC) 15 MG tablet, Take by mouth., Disp: , Rfl:  .  metFORMIN (GLUCOPHAGE) 500 MG tablet, 1 BY MOUTH ONCE DAILY FOR DIABETES, Disp: , Rfl: 1 .  tiZANidine (ZANAFLEX) 4 MG tablet, Take by mouth., Disp: , Rfl:  .  traMADol (ULTRAM) 50 MG tablet, Take 1 tablet (50 mg total) by mouth every 6 (six) hours as needed for severe pain., Disp: 120 tablet, Rfl: 0  ROS  Constitutional: Denies any fever or chills Gastrointestinal: No reported hemesis, hematochezia, vomiting, or acute GI distress Musculoskeletal: Denies any acute onset joint swelling, redness, loss of ROM, or weakness Neurological: No reported episodes of acute onset apraxia, aphasia, dysarthria, agnosia, amnesia, paralysis, loss of coordination, or loss of consciousness  Allergies  [name] has No Known Allergies.  PFSH  Drug: [name]  reports that she does not use drugs. Alcohol:  reports that she does not drink alcohol. Tobacco:  reports that she has never smoked. She has never used smokeless tobacco. Medical:  has a past medical history of Chronic sciatica, Diabetes mellitus without complication ([place]), Lumbar radiculopathy, and Sciatica. Surgical: [name]  has a past surgical history that includes etopic pregnancy and Colonoscopy (2013). Family: family history includes Aplastic anemia in her daughter; Breast cancer in her cousin; Cancer in her daughter; Diabetes in her mother; Emphysema in her brother; Kidney disease in her daughter; Other in her daughter.  Constitutional Exam  General appearance: Well  nourished, well developed, and well hydrated. In no apparent acute distress There were no vitals filed for this visit. BMI Assessment: Estimated body mass index is 38.97 kg/m as calculated from the following:   Height as of 09/19/17: 5' 3"  (1.6 m).   Weight as of 09/19/17: 220 lb (99.8 kg).  BMI interpretation table: BMI level Category Range association with higher incidence of chronic pain  <18 kg/m2 Underweight   18.5-24.9 kg/m2 Ideal body weight   25-29.9 kg/m2  Overweight Increased incidence by 20%  30-34.9 kg/m2 Obese (Class I) Increased incidence by 68%  35-39.9 kg/m2 Severe obesity (Class II) Increased incidence by 136%  >40 kg/m2 Extreme obesity (Class III) Increased incidence by 254%   Patient's current BMI Ideal Body weight  There is no height or weight on file to calculate BMI. Ideal body weight: 52.4 kg (115 lb 8.3 oz) Adjusted ideal body weight: 71.4 kg (157 lb 5 oz)   BMI Readings from Last 4 Encounters:  09/19/17 38.97 kg/m  09/09/17 39.33 kg/m  08/09/17 39.33 kg/m  06/10/17 39.68 kg/m   Wt Readings from Last 4 Encounters:  09/19/17 220 lb (99.8 kg)  09/09/17 222 lb (100.7 kg)  08/09/17 222 lb (100.7 kg)  06/10/17 224 lb (101.6 kg)  Psych/Mental status: Alert, oriented x 3 (person, place, & time)       Eyes: PERLA Respiratory: No evidence of acute respiratory distress  Cervical Spine Area Exam  Skin & Axial Inspection: No masses, redness, edema, swelling, or associated skin lesions Alignment: Symmetrical Functional ROM: Unrestricted ROM      Stability: No instability detected Muscle Tone/Strength: Functionally intact. No obvious neuro-muscular anomalies detected. Sensory (Neurological): Unimpaired Palpation: No palpable anomalies              Upper Extremity (UE) Exam    Side: Right upper extremity  Side: Left upper extremity  Skin & Extremity Inspection: Skin color, temperature, and hair growth are WNL. No peripheral edema or cyanosis. No masses, redness,  swelling, asymmetry, or associated skin lesions. No contractures.  Skin & Extremity Inspection: Skin color, temperature, and hair growth are WNL. No peripheral edema or cyanosis. No masses, redness, swelling, asymmetry, or associated skin lesions. No contractures.  Functional ROM: Unrestricted ROM          Functional ROM: Unrestricted ROM          Muscle Tone/Strength: Functionally intact. No obvious neuro-muscular anomalies detected.  Muscle Tone/Strength: Functionally intact. No obvious neuro-muscular anomalies detected.  Sensory (Neurological): Unimpaired          Sensory (Neurological): Unimpaired          Palpation: No palpable anomalies              Palpation: No palpable anomalies              Provocative Test(s):  Phalen's test: deferred Tinel's test: deferred Apley's scratch test (touch opposite shoulder):  Action 1 (Across chest): deferred Action 2 (Overhead): deferred Action 3 (LB reach): deferred   Provocative Test(s):  Phalen's test: deferred Tinel's test: deferred Apley's scratch test (touch opposite shoulder):  Action 1 (Across chest): deferred Action 2 (Overhead): deferred Action 3 (LB reach): deferred    Thoracic Spine Area Exam  Skin & Axial Inspection: No masses, redness, or swelling Alignment: Symmetrical Functional ROM: Unrestricted ROM Stability: No instability detected Muscle Tone/Strength: Functionally intact. No obvious neuro-muscular anomalies detected. Sensory (Neurological): Unimpaired Muscle strength & Tone: No palpable anomalies  Lumbar Spine Area Exam  Skin & Axial Inspection: No masses, redness, or swelling Alignment: Symmetrical Functional ROM: Improved after treatment       Stability: No instability detected Muscle Tone/Strength: Functionally intact. No obvious neuro-muscular anomalies detected. Sensory (Neurological): Unimpaired Palpation: No palpable anomalies       Provocative Tests: Lumbar Hyperextension/rotation test: (+) on the right for  facet joint pain. Lumbar quadrant test (Kemp's test): (+) on the right for facet joint pain. Lumbar Lateral bending test: deferred today  Patrick's Maneuver: deferred today                   FABER test: deferred today       Thigh-thrust test: deferred today       S-I compression test: deferred today       S-I distraction test: deferred today        Gait & Posture Assessment  Ambulation: Unassisted Gait: Relatively normal for age and body habitus Posture: WNL   Lower Extremity Exam    Side: Right lower extremity  Side: Left lower extremity  Stability: No instability observed          Stability: No instability observed          Skin & Extremity Inspection: Skin color, temperature, and hair growth are WNL. No peripheral edema or cyanosis. No masses, redness, swelling, asymmetry, or associated skin lesions. No contractures.  Skin & Extremity Inspection: Skin color, temperature, and hair growth are WNL. No peripheral edema or cyanosis. No masses, redness, swelling, asymmetry, or associated skin lesions. No contractures.  Functional ROM: Unrestricted ROM                  Functional ROM: Unrestricted ROM                  Muscle Tone/Strength: Functionally intact. No obvious neuro-muscular anomalies detected.  Muscle Tone/Strength: Functionally intact. No obvious neuro-muscular anomalies detected.  Sensory (Neurological): Unimpaired  Sensory (Neurological): Unimpaired  Palpation: No palpable anomalies  Palpation: No palpable anomalies   Assessment  Primary Diagnosis & Pertinent Problem List: The primary encounter diagnosis was Chronic low back pain (Primary Area of Pain) (Bilateral) (R>L). Diagnoses of Lumbar facet syndrome (Bilateral) (R>L), Chronic lower extremity pain (Secondary Area of Pain) (Bilateral) (R>L), Lumbar facet osteoarthritis, Lumbar facet hypertrophy (Multilevel) (Bilateral), and Spondylosis without myelopathy or radiculopathy, lumbar region were also pertinent to this  visit.  Status Diagnosis  Controlled Controlled Controlled 1. Chronic low back pain (Primary Area of Pain) (Bilateral) (R>L)   2. Lumbar facet syndrome (Bilateral) (R>L)   3. Chronic lower extremity pain (Secondary Area of Pain) (Bilateral) (R>L)   4. Lumbar facet osteoarthritis   5. Lumbar facet hypertrophy (Multilevel) (Bilateral)   6. Spondylosis without myelopathy or radiculopathy, lumbar region     Problems updated and reviewed during this visit: No problems updated. Plan of Care  Pharmacotherapy (Medications Ordered): No orders of the defined types were placed in this encounter.  Medications administered today: [name] had no medications administered during this visit.  Procedure Orders    No procedure(s) ordered today   Lab Orders  No laboratory test(s) ordered today   Imaging Orders  No imaging studies ordered today   Referral Orders  No referral(s) requested today    Interventional management options: Planned, scheduled, and/or pending:   Therapeutic right-sided lumbar facet RFA under fluoroscopic guidance and IV sedation    Considering:   Diagnostic right-sided lumbar facet block #3  Possible right-sided lumbar facet RFA  Diagnostic bilaterallumbar facet nerve block Possible bilateral lumbar facet RFA Diagnostic left-sided intra-articular knee injection Diagnostic left sided Hyalgan series Diagnostic left-sided genicular nerve block Possible left-sided genicular RFA   Palliative PRN treatment(s):   Palliative/therapeutic  right-sided lumbar facet block #3 under fluoroscopic guidance and IV sedation   Provider-requested follow-up: No follow-ups on file.  Future Appointments  Date Time Provider Ray  10/02/2017  8:15 AM Milinda Pointer, MD [facility] None   Primary Care Physician: Tomasa Hose  A, MD Location: [facility] Note by: Gaspar Cola, MD Date: 10/02/2017; Time: 10:55 AM

## 2017-10-02 ENCOUNTER — Ambulatory Visit: Payer: BLUE CROSS/BLUE SHIELD | Attending: Pain Medicine | Admitting: Pain Medicine

## 2017-10-02 ENCOUNTER — Encounter: Payer: Self-pay | Admitting: Pain Medicine

## 2017-10-02 ENCOUNTER — Ambulatory Visit: Payer: BLUE CROSS/BLUE SHIELD | Admitting: Pain Medicine

## 2017-10-02 ENCOUNTER — Other Ambulatory Visit: Payer: Self-pay

## 2017-10-02 VITALS — BP 138/76 | HR 84 | Temp 98.6°F | Ht 63.0 in | Wt 220.0 lb

## 2017-10-02 DIAGNOSIS — M5442 Lumbago with sciatica, left side: Secondary | ICD-10-CM

## 2017-10-02 DIAGNOSIS — M1712 Unilateral primary osteoarthritis, left knee: Secondary | ICD-10-CM | POA: Diagnosis not present

## 2017-10-02 DIAGNOSIS — D631 Anemia in chronic kidney disease: Secondary | ICD-10-CM | POA: Diagnosis not present

## 2017-10-02 DIAGNOSIS — E1122 Type 2 diabetes mellitus with diabetic chronic kidney disease: Secondary | ICD-10-CM | POA: Insufficient documentation

## 2017-10-02 DIAGNOSIS — Z9889 Other specified postprocedural states: Secondary | ICD-10-CM | POA: Diagnosis not present

## 2017-10-02 DIAGNOSIS — Z79891 Long term (current) use of opiate analgesic: Secondary | ICD-10-CM | POA: Insufficient documentation

## 2017-10-02 DIAGNOSIS — M5116 Intervertebral disc disorders with radiculopathy, lumbar region: Secondary | ICD-10-CM | POA: Diagnosis not present

## 2017-10-02 DIAGNOSIS — N189 Chronic kidney disease, unspecified: Secondary | ICD-10-CM | POA: Diagnosis not present

## 2017-10-02 DIAGNOSIS — M4726 Other spondylosis with radiculopathy, lumbar region: Secondary | ICD-10-CM | POA: Insufficient documentation

## 2017-10-02 DIAGNOSIS — M545 Low back pain: Secondary | ICD-10-CM | POA: Diagnosis present

## 2017-10-02 DIAGNOSIS — Z7984 Long term (current) use of oral hypoglycemic drugs: Secondary | ICD-10-CM | POA: Insufficient documentation

## 2017-10-02 DIAGNOSIS — M47816 Spondylosis without myelopathy or radiculopathy, lumbar region: Secondary | ICD-10-CM | POA: Diagnosis not present

## 2017-10-02 DIAGNOSIS — M5441 Lumbago with sciatica, right side: Secondary | ICD-10-CM

## 2017-10-02 DIAGNOSIS — G8929 Other chronic pain: Secondary | ICD-10-CM

## 2017-10-02 DIAGNOSIS — G894 Chronic pain syndrome: Secondary | ICD-10-CM | POA: Diagnosis not present

## 2017-10-02 NOTE — Patient Instructions (Signed)
____________________________________________________________________________________________  Preparing for Procedure with Sedation  Instructions: . Oral Intake: Do not eat or drink anything for at least 8 hours prior to your procedure. . Transportation: Public transportation is not allowed. Bring an adult driver. The driver must be physically present in our waiting room before any procedure can be started. . Physical Assistance: Bring an adult physically capable of assisting you, in the event you need help. This adult should keep you company at home for at least 6 hours after the procedure. . Blood Pressure Medicine: Take your blood pressure medicine with a sip of water the morning of the procedure. . Blood thinners:  . Diabetics on insulin: Notify the staff so that you can be scheduled 1st case in the morning. If your diabetes requires high dose insulin, take only  of your normal insulin dose the morning of the procedure and notify the staff that you have done so. . Preventing infections: Shower with an antibacterial soap the morning of your procedure. . Build-up your immune system: Take 1000 mg of Vitamin C with every meal (3 times a day) the day prior to your procedure. . Antibiotics: Inform the staff if you have a condition or reason that requires you to take antibiotics before dental procedures. . Pregnancy: If you are pregnant, call and cancel the procedure. . Sickness: If you have a cold, fever, or any active infections, call and cancel the procedure. . Arrival: You must be in the facility at least 30 minutes prior to your scheduled procedure. . Children: Do not bring children with you. . Dress appropriately: Bring dark clothing that you would not mind if they get stained. . Valuables: Do not bring any jewelry or valuables.  Procedure appointments are reserved for interventional treatments only. . No Prescription Refills. . No medication changes will be discussed during procedure  appointments. . No disability issues will be discussed.  Remember:  Regular Business hours are:  Monday to Thursday 8:00 AM to 4:00 PM  Provider's Schedule: Karren Newland, MD:  Procedure days: Tuesday and Thursday 7:30 AM to 4:00 PM  Bilal Lateef, MD:  Procedure days: Monday and Wednesday 7:30 AM to 4:00 PM ____________________________________________________________________________________________    

## 2017-10-02 NOTE — Progress Notes (Signed)
Patient's Name: Meghan Vasquez  MRN: [id number]  Referring Provider: Donnie Coffin, MD  DOB: 20-May-1960  PCP: Donnie Coffin, MD  DOS: 10/02/2017  Note by: Gaspar Cola, MD  Service setting: Ambulatory outpatient  Specialty: Interventional Pain Management  Location: [facility] (AMB) Pain Management Facility    Patient type: Established   Primary Reason(s) for Visit: Encounter for post-procedure evaluation of chronic illness with mild to moderate exacerbation CC: Back Pain (lower)  HPI  Meghan Vasquez is a 57 y.o. year old, female patient, who comes today for a post-procedure evaluation. 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[place]); Infertility of tubal origin; Perimenopause; Anemia; Skin cyst; Breast cyst; Chronic low back pain (Primary Area of Pain) (Bilateral) (R>L); Chronic lower extremity pain (Secondary Area of Pain) (Bilateral) (R>L); Chronic knee pain [facility] Area of Pain) (Left); Chronic pain syndrome; Disorder of bone, unspecified; Other specified health status; Other long term (current) drug therapy; Lumbar facet syndrome (Bilateral) (R>L); Osteoarthritis of knee (Left); Tricompartment osteoarthritis of knee (Left); DDD (degenerative disc disease), lumbar; Lumbar facet osteoarthritis; Osteoarthritis of  Lumbar spine; Chronic lumbar radicular pain (R) (L5); Lumbar facet hypertrophy (Multilevel) (Bilateral); and Spondylosis without myelopathy or radiculopathy, lumbar region on their problem list. Her primarily concern today is the Back Pain (lower)  Pain Assessment: Location: Lower Back Radiating: Denies Onset: More than a month ago Duration: Chronic pain Quality: Aching, Discomfort, Constant Severity: 3 /10 (subjective, self-reported pain score)  Note: Reported level is compatible with observation.                         When using our objective Pain Scale, levels between 6 and 10/10 are said to belong in an emergency room, as it progressively worsens from a 6/10,  described as severely limiting, requiring emergency care not usually available at an outpatient pain management facility. At a 6/10 level, communication becomes difficult and requires great effort. Assistance to reach the emergency department may be required. Facial flushing and profuse sweating along with potentially dangerous increases in heart rate and blood pressure will be evident. Effect on ADL: prolong standing and bend Timing: Constant Modifying factors: ice and cold, elevate feet BP: 138/76  HR: 84  Meghan Vasquez comes in today for post-procedure evaluation after the treatment done on 09/20/2017.  Further details on both, my assessment(s), as well as the proposed treatment plan, please see below.  Post-Procedure Assessment  09/19/2017 Procedure: Diagnostic right-sided lumbar facet block #2  under fluoroscopic guidance and IV sedation Pre-procedure pain score:  5/10 Post-procedure pain score: 0/10 (100% relief) Influential Factors: BMI: 38.97 kg/m Intra-procedural challenges: None observed.         Assessment challenges: None detected.              Reported side-effects: None.        Post-procedural adverse reactions or complications: None reported         Sedation: Sedation provided. When no sedatives are used, the analgesic levels obtained are directly associated to the effectiveness of the local anesthetics. However, when sedation is provided, the level of analgesia obtained during the initial 1 hour following the intervention, is believed to be the result of a combination of factors. These factors may include, but are not limited to: 1. The effectiveness of the local anesthetics used. 2. The effects of the analgesic(s) and/or anxiolytic(s) used. 3. The degree of discomfort experienced by the patient at the time of the procedure. 4. The  patients ability and reliability in recalling and recording the events. 5. The presence and influence of possible secondary gains and/or psychosocial  factors. Reported result: Relief experienced during the 1st hour after the procedure: 100 % (Ultra-Short Term Relief)            Interpretative annotation: Clinically appropriate result. Analgesia during this period is likely to be Local Anesthetic and/or IV Sedative (Analgesic/Anxiolytic) related.          Effects of local anesthetic: The analgesic effects attained during this period are directly associated to the localized infiltration of local anesthetics and therefore cary significant diagnostic value as to the etiological location, or anatomical origin, of the pain. Expected duration of relief is directly dependent on the pharmacodynamics of the local anesthetic used. Long-acting (4-6 hours) anesthetics used.  Reported result: Relief during the next 4 to 6 hour after the procedure: 100 % (Short-Term Relief)            Interpretative annotation: Clinically appropriate result. Analgesia during this period is likely to be Local Anesthetic-related.          Long-term benefit: Defined as the period of time past the expected duration of local anesthetics (1 hour for short-acting and 4-6 hours for long-acting). With the possible exception of prolonged sympathetic blockade from the local anesthetics, benefits during this period are typically attributed to, or associated with, other factors such as analgesic sensory neuropraxia, antiinflammatory effects, or beneficial biochemical changes provided by agents other than the local anesthetics.  Reported result: Extended relief following procedure: 45 % (Long-Term Relief)            Interpretative annotation: Clinically appropriate result. Partial relief. No permanent benefit expected. Persistent algesic mechanism detected. Etiology is likely mechanical rather than inflammatory.  Current benefits: Defined as reported results that persistent at this point in time.   Analgesia: <50 %            Function: Somewhat improved ROM: Somewhat improved Interpretative  annotation: Recurrence of symptoms. Incomplete therapeutic success. Effective diagnostic intervention. Benefit could be steroid-related.  Interpretation: Results would suggest a successful diagnostic intervention.         Meghan Vasquez indicates having had an unsuccessful trial of physical therapy, which she described as non-beneficial and painful.  Plan:  Proceed with Radiofrequency Ablation for the purpose of attaining long-term benefits.       "The patient has failed to respond to conservative therapies including over-the-counter medications, anti-inflammatories, muscle relaxants, membrane stabilizers, opioids, physical therapy modalities such as heat and ice, as well as more invasive techniques such as nerve blocks. Because Ms. Foster did attain more than 50% relief of the pain during a series of diagnostic blocks conducted in separate occasions, I believe it is medically necessary to proceed with Radiofrequency Ablation, in order to attempt gaining longer relief.   Medical Necessity: Meghan Vasquez has been dealing with the above chronic pain from the Spondylosis without myelopathy or radiculopathy, lumbosacral region [M47.817] for longer than three months and has either failed to respond, was unable to tolerate, or simply did not get enough benefit from other more conservative therapies including, but not limited to: 1. Over-the-counter medications 2. Anti-inflammatory medications 3. Muscle relaxants 4. Membrane stabilizers 5. Opioids 6. Physical therapy 7. Modalities (Heat, ice, etc.) 8. Invasive techniques such as nerve blocks. Meghan Vasquez has attained more than 50% relief of the pain from a series of diagnostic injections conducted in separate occasions. For this reason, I believe it is medically necessary  to proceed Radiofrequency Ablation for the purpose of attempting to prolong the duration of the benefits seen with the diagnostic injections.  Laboratory Chemistry  Inflammation Markers (CRP:  Acute Phase) (ESR: Chronic Phase) Lab Results  Component Value Date   CRP 6.4 (H) 04/17/2017   ESRSEDRATE CANCELED 04/17/2017                         Renal Markers Lab Results  Component Value Date   BUN 20 09/09/2017   CREATININE 1.23 (H) 09/09/2017   BCR 14 04/17/2017   GFRAA 56 (L) 09/09/2017   GFRNONAA 48 (L) 09/09/2017                             Hepatic Markers Lab Results  Component Value Date   AST 14 04/17/2017   ALT 18 07/01/2015   ALBUMIN 4.6 04/17/2017                        Neuropathy Markers Lab Results  Component Value Date   HIV Non Reactive 01/31/2015                        Hematology Parameters Lab Results  Component Value Date   PLT 276 09/09/2017   HGB 11.7 (L) 09/09/2017   HCT 34.7 (L) 09/09/2017                        Note: Lab results reviewed.  Recent Diagnostic Imaging Results  DG C-Arm 1-60 Min-No Report Fluoroscopy was utilized by the requesting physician.  No radiographic  interpretation.   Complexity Note: I personally reviewed the fluoroscopic imaging of the procedure.                        Meds   Current Outpatient Medications:  .  cyclobenzaprine (FLEXERIL) 10 MG tablet, Take by mouth., Disp: , Rfl:  .  ibuprofen (ADVIL,MOTRIN) 200 MG tablet, Take 200 mg by mouth every 6 (six) hours as needed., Disp: , Rfl:  .  meclizine (ANTIVERT) 25 MG tablet, Take 1 tablet (25 mg total) by mouth 3 (three) times daily as needed for dizziness., Disp: 30 tablet, Rfl: 0 .  meloxicam (MOBIC) 15 MG tablet, Take by mouth., Disp: , Rfl:  .  metFORMIN (GLUCOPHAGE) 500 MG tablet, 1 BY MOUTH ONCE DAILY FOR DIABETES, Disp: , Rfl: 1 .  tiZANidine (ZANAFLEX) 4 MG tablet, Take by mouth., Disp: , Rfl:  .  traMADol (ULTRAM) 50 MG tablet, Take 1 tablet (50 mg total) by mouth every 6 (six) hours as needed for severe pain., Disp: 120 tablet, Rfl: 0  ROS  Constitutional: Denies any fever or chills Gastrointestinal: No reported hemesis, hematochezia,  vomiting, or acute GI distress Musculoskeletal: Denies any acute onset joint swelling, redness, loss of ROM, or weakness Neurological: No reported episodes of acute onset apraxia, aphasia, dysarthria, agnosia, amnesia, paralysis, loss of coordination, or loss of consciousness  Allergies  Meghan Vasquez has No Known Allergies.  PFSH  Drug: Meghan Vasquez  reports that she does not use drugs. Alcohol:  reports that she does not drink alcohol. Tobacco:  reports that she has never smoked. She has never used smokeless tobacco. Medical:  has a past medical history of Chronic sciatica, Diabetes mellitus without complication ([place]), Lumbar radiculopathy, and Sciatica. Surgical: Meghan Vasquez  has a past surgical history that includes etopic pregnancy and Colonoscopy (2013). Family: family history includes Aplastic anemia in her daughter; Breast cancer in her cousin; Cancer in her daughter; Diabetes in her mother; Emphysema in her brother; Kidney disease in her daughter; Other in her daughter.  Constitutional Exam  General appearance: Well nourished, well developed, and well hydrated. In no apparent acute distress Vitals:   10/02/17 0812  BP: 138/76  Pulse: 84  Temp: 98.6 F (37 C)  SpO2: 98%  Weight: 220 lb (99.8 kg)  Height: 5' 3"  (1.6 m)   BMI Assessment: Estimated body mass index is 38.97 kg/m as calculated from the following:   Height as of this encounter: 5' 3"  (1.6 m).   Weight as of this encounter: 220 lb (99.8 kg).  BMI interpretation table: BMI level Category Range association with higher incidence of chronic pain  <18 kg/m2 Underweight   18.5-24.9 kg/m2 Ideal body weight   25-29.9 kg/m2 Overweight Increased incidence by 20%  30-34.9 kg/m2 Obese (Class I) Increased incidence by 68%  35-39.9 kg/m2 Severe obesity (Class II) Increased incidence by 136%  >40 kg/m2 Extreme obesity (Class III) Increased incidence by 254%   Patient's current BMI Ideal Body weight  Body mass index is 38.97  kg/m. Ideal body weight: 52.4 kg (115 lb 8.3 oz) Adjusted ideal body weight: 71.4 kg (157 lb 5 oz)   BMI Readings from Last 4 Encounters:  10/02/17 38.97 kg/m  09/19/17 38.97 kg/m  09/09/17 39.33 kg/m  08/09/17 39.33 kg/m   Wt Readings from Last 4 Encounters:  10/02/17 220 lb (99.8 kg)  09/19/17 220 lb (99.8 kg)  09/09/17 222 lb (100.7 kg)  08/09/17 222 lb (100.7 kg)  Psych/Mental status: Alert, oriented x 3 (person, place, & time)       Eyes: PERLA Respiratory: No evidence of acute respiratory distress  Cervical Spine Area Exam  Skin & Axial Inspection: No masses, redness, edema, swelling, or associated skin lesions Alignment: Symmetrical Functional ROM: Unrestricted ROM      Stability: No instability detected Muscle Tone/Strength: Functionally intact. No obvious neuro-muscular anomalies detected. Sensory (Neurological): Unimpaired Palpation: No palpable anomalies              Upper Extremity (UE) Exam    Side: Right upper extremity  Side: Left upper extremity  Skin & Extremity Inspection: Skin color, temperature, and hair growth are WNL. No peripheral edema or cyanosis. No masses, redness, swelling, asymmetry, or associated skin lesions. No contractures.  Skin & Extremity Inspection: Skin color, temperature, and hair growth are WNL. No peripheral edema or cyanosis. No masses, redness, swelling, asymmetry, or associated skin lesions. No contractures.  Functional ROM: Unrestricted ROM          Functional ROM: Unrestricted ROM          Muscle Tone/Strength: Functionally intact. No obvious neuro-muscular anomalies detected.  Muscle Tone/Strength: Functionally intact. No obvious neuro-muscular anomalies detected.  Sensory (Neurological): Unimpaired          Sensory (Neurological): Unimpaired          Palpation: No palpable anomalies              Palpation: No palpable anomalies              Provocative Test(s):  Phalen's test: deferred Tinel's test: deferred Apley's scratch  test (touch opposite shoulder):  Action 1 (Across chest): deferred Action 2 (Overhead): deferred Action 3 (LB reach): deferred   Provocative Test(s):  Phalen's test: deferred  Tinel's test: deferred Apley's scratch test (touch opposite shoulder):  Action 1 (Across chest): deferred Action 2 (Overhead): deferred Action 3 (LB reach): deferred    Thoracic Spine Area Exam  Skin & Axial Inspection: No masses, redness, or swelling Alignment: Symmetrical Functional ROM: Unrestricted ROM Stability: No instability detected Muscle Tone/Strength: Functionally intact. No obvious neuro-muscular anomalies detected. Sensory (Neurological): Unimpaired Muscle strength & Tone: No palpable anomalies  Lumbar Spine Area Exam  Skin & Axial Inspection: No masses, redness, or swelling Alignment: Symmetrical Functional ROM: Decreased ROM       Stability: No instability detected Muscle Tone/Strength: Functionally intact. No obvious neuro-muscular anomalies detected. Sensory (Neurological): Movement-associated pain Palpation: Complains of area being tender to palpation       Provocative Tests: Lumbar Hyperextension/rotation test: (+) on the right for facet joint pain. Lumbar quadrant test (Kemp's test): (+) on the right for facet joint pain. Lumbar Lateral bending test: deferred today       Patrick's Maneuver: deferred today                   FABER test: deferred today       Thigh-thrust test: deferred today       S-I compression test: deferred today       S-I distraction test: deferred today        Gait & Posture Assessment  Ambulation: Limited Gait: Antalgic gait (limping) Posture: Antalgic. The patient is rocking back and forth due to her pain.  Lower Extremity Exam    Side: Right lower extremity  Side: Left lower extremity  Stability: No instability observed          Stability: No instability observed          Skin & Extremity Inspection: Skin color, temperature, and hair growth are WNL. No  peripheral edema or cyanosis. No masses, redness, swelling, asymmetry, or associated skin lesions. No contractures.  Skin & Extremity Inspection: Skin color, temperature, and hair growth are WNL. No peripheral edema or cyanosis. No masses, redness, swelling, asymmetry, or associated skin lesions. No contractures.  Functional ROM: Unrestricted ROM                  Functional ROM: Unrestricted ROM                  Muscle Tone/Strength: Functionally intact. No obvious neuro-muscular anomalies detected.  Muscle Tone/Strength: Functionally intact. No obvious neuro-muscular anomalies detected.  Sensory (Neurological): Unimpaired  Sensory (Neurological): Unimpaired  Palpation: No palpable anomalies  Palpation: No palpable anomalies   Assessment  Primary Diagnosis & Pertinent Problem List: The primary encounter diagnosis was Chronic low back pain (Primary Area of Pain) (Bilateral) (R>L). Diagnoses of Lumbar facet syndrome (Bilateral) (R>L), Lumbar facet osteoarthritis, Lumbar facet hypertrophy (Multilevel) (Bilateral), and Spondylosis without myelopathy or radiculopathy, lumbar region were also pertinent to this visit.  Status Diagnosis  Controlled Controlled Controlled 1. Chronic low back pain (Primary Area of Pain) (Bilateral) (R>L)   2. Lumbar facet syndrome (Bilateral) (R>L)   3. Lumbar facet osteoarthritis   4. Lumbar facet hypertrophy (Multilevel) (Bilateral)   5. Spondylosis without myelopathy or radiculopathy, lumbar region     Problems updated and reviewed during this visit: No problems updated. Plan of Care  Pharmacotherapy (Medications Ordered): No orders of the defined types were placed in this encounter.  Medications administered today: [name] had no medications administered during this visit.   Procedure Orders     Radiofrequency,Lumbar Lab Orders  No laboratory test(s)  ordered today   Imaging Orders  No imaging studies ordered today   Referral Orders  No  referral(s) requested today    Interventional management options: Planned, scheduled, and/or pending:   Therapeutic right-sided lumbar facet RFA under fluoroscopic guidance and IV sedation    Considering:   Diagnostic right-sided lumbar facet block #3  Possible right-sided lumbar facet RFA  Diagnostic left-sided intra-articular knee injection Diagnostic left sided Hyalgan series Diagnostic left-sided genicular nerve block Possible left-sided genicular RFA   Palliative PRN treatment(s):   None at this time   Provider-requested follow-up: Return for RFA (fluoro + sedation): (R) L-FCT RFA.  No future appointments. Primary Care Physician: Donnie Coffin, MD Location: [facility] Note by: Gaspar Cola, MD Date: 10/02/2017; Time: 8:44 AM

## 2017-10-28 ENCOUNTER — Ambulatory Visit: Payer: BLUE CROSS/BLUE SHIELD | Admitting: Pain Medicine

## 2017-12-16 ENCOUNTER — Encounter: Payer: Self-pay | Admitting: Emergency Medicine

## 2017-12-16 ENCOUNTER — Emergency Department
Admission: EM | Admit: 2017-12-16 | Discharge: 2017-12-16 | Disposition: A | Discharge status: Home / Self Care | Payer: BLUE CROSS/BLUE SHIELD | Attending: Emergency Medicine | Admitting: Emergency Medicine

## 2017-12-16 ENCOUNTER — Emergency Department: Payer: BLUE CROSS/BLUE SHIELD

## 2017-12-16 ENCOUNTER — Other Ambulatory Visit: Payer: Self-pay

## 2017-12-16 DIAGNOSIS — E119 Type 2 diabetes mellitus without complications: Secondary | ICD-10-CM | POA: Insufficient documentation

## 2017-12-16 DIAGNOSIS — R0789 Other chest pain: Secondary | ICD-10-CM | POA: Insufficient documentation

## 2017-12-16 DIAGNOSIS — Z7984 Long term (current) use of oral hypoglycemic drugs: Secondary | ICD-10-CM | POA: Insufficient documentation

## 2017-12-16 DIAGNOSIS — Z79899 Other long term (current) drug therapy: Secondary | ICD-10-CM | POA: Insufficient documentation

## 2017-12-16 DIAGNOSIS — R079 Chest pain, unspecified: Secondary | ICD-10-CM

## 2017-12-16 LAB — COMPREHENSIVE METABOLIC PANEL
ALK PHOS: 66 U/L (ref 38–126)
ALT: 16 U/L (ref 0–44)
AST: 16 U/L (ref 15–41)
Albumin: 4.1 g/dL (ref 3.5–5.0)
Anion gap: 5 (ref 5–15)
BUN: 19 mg/dL (ref 6–20)
CO2: 29 mmol/L (ref 22–32)
CREATININE: 1.32 mg/dL — AB (ref 0.44–1.00)
Calcium: 9.4 mg/dL (ref 8.9–10.3)
Chloride: 107 mmol/L (ref 98–111)
GFR calc non Af Amer: 44 mL/min — ABNORMAL LOW (ref >60)
GFR, EST AFRICAN AMERICAN: 51 mL/min — AB (ref >60)
GLUCOSE: 130 mg/dL — AB (ref 70–99)
Potassium: 4.2 mmol/L (ref 3.5–5.1)
SODIUM: 141 mmol/L (ref 135–145)
TOTAL PROTEIN: 7.6 g/dL (ref 6.5–8.1)
Total Bilirubin: 0.5 mg/dL (ref 0.3–1.2)

## 2017-12-16 LAB — TROPONIN I
Troponin I: <0.03 ng/mL (ref <0.03)
Troponin I: <0.03 ng/mL (ref <0.03)

## 2017-12-16 LAB — CBC
HCT: 33.7 % — ABNORMAL LOW (ref 35.0–47.0)
HEMOGLOBIN: 11.5 g/dL — AB (ref 12.0–16.0)
MCH: 30.4 pg (ref 26.0–34.0)
MCHC: 34.1 g/dL (ref 32.0–36.0)
MCV: 89.1 fL (ref 80.0–100.0)
Platelets: 266 10*3/uL (ref 150–440)
RBC: 3.78 MIL/uL — ABNORMAL LOW (ref 3.80–5.20)
RDW: 14.4 % (ref 11.5–14.5)
WBC: 7.9 10*3/uL (ref 3.6–11.0)

## 2017-12-16 MED ORDER — GI COCKTAIL ~~LOC~~
30.0000 mL | Freq: Once | ORAL | Status: AC
  Administered 2017-12-16: 30 mL via ORAL
  Filled 2017-12-16: qty 30

## 2017-12-16 MED ORDER — IOHEXOL 350 MG/ML SOLN
75.0000 mL | Freq: Once | INTRAVENOUS | Status: AC | PRN
  Administered 2017-12-16: 75 mL via INTRAVENOUS

## 2017-12-16 MED ORDER — ONDANSETRON HCL 4 MG/2ML IJ SOLN
4.0000 mg | Freq: Once | INTRAMUSCULAR | Status: AC
  Administered 2017-12-16: 4 mg via INTRAVENOUS
  Filled 2017-12-16: qty 2

## 2017-12-16 MED ORDER — MORPHINE SULFATE (PF) 4 MG/ML IV SOLN
4.0000 mg | Freq: Once | INTRAVENOUS | Status: AC
  Administered 2017-12-16: 4 mg via INTRAVENOUS
  Filled 2017-12-16: qty 1

## 2017-12-16 NOTE — ED Notes (Signed)
Pt ambulated from bed to toilet in room. Pt states she feels lightheaded and dizzy, pain still 3/10. Pain not increases with ambulation.

## 2017-12-16 NOTE — ED Provider Notes (Signed)
[facility] Emergency Department Provider Note  Time seen: 12:15 PM  I have reviewed the triage vital signs and the nursing notes.   HISTORY  Chief Complaint Chest Pain    HPI Meghan Vasquez is a 57 y.o. female with a past medical history of diabetes, sciatica, chronic back pain, presents to the emergency department for chest pain.  According to the patient at approximately 730 8:00 this morning she was in the kitchen when she suddenly felt flushed and developed chest pain felt like she needed to sit down so she would not pass out.  States she was able to sit down and that sensation past but she continues to have mild chest pain which she states is mild to moderate dull pain in the center of her chest worse with deep inspiration.  Describes the pain as dull mild pain with occasional moderate sharp pains with deep inspiration.  Denies any trauma.  Denies any leg pain or swelling.   Past Medical History:  Diagnosis Date  . Chronic sciatica   . Diabetes mellitus without complication ([place])   . Lumbar radiculopathy   . Sciatica    Workers Comp    Patient Active Problem List   Diagnosis Date Noted  . Spondylosis without myelopathy or radiculopathy, lumbar region 06/03/2017  . Lumbar facet hypertrophy (Multilevel) (Bilateral) 05/23/2017  . Lumbar facet syndrome (Bilateral) (R>L) 04/29/2017  . Osteoarthritis of knee (Left) 04/29/2017  . Tricompartment osteoarthritis of knee (Left) 04/29/2017  . DDD (degenerative disc disease), lumbar 04/29/2017  . Lumbar facet osteoarthritis 04/29/2017  . Osteoarthritis of  Lumbar spine 04/29/2017  . Chronic lumbar radicular pain (R) (L5) 04/29/2017  . Chronic low back pain (Primary Area of Pain) (Bilateral) (R>L) 04/17/2017  . Chronic lower extremity pain (Secondary Area of Pain) (Bilateral) (R>L) 04/17/2017  . Chronic knee pain [facility] Area of Pain) (Left) 04/17/2017  . Chronic pain syndrome 04/17/2017  . Disorder of bone,  unspecified 04/17/2017  . Other specified health status 04/17/2017  . Other long term (current) drug therapy 04/17/2017  . Skin cyst 09/05/2015  . Breast cyst 09/05/2015  . Anemia 02/01/2015  . 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 Perimenopause 01/31/2015  . 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 COLONOSCOPY  2013  . etopic pregnancy      Prior to Admission medications   Medication Sig Start Date End Date Taking? Authorizing Provider  cyclobenzaprine (FLEXERIL) 10 MG tablet Take by mouth. 09/20/14   [provider]  ibuprofen (ADVIL,MOTRIN) 200 MG tablet Take 200 mg by mouth every 6 (six) hours as needed.    [provider]  meclizine (ANTIVERT) 25 MG tablet Take 1 tablet (25 mg total) by mouth 3 (three) times daily as needed for dizziness. 09/09/17   Darel Hong, MD  meloxicam (MOBIC) 15 MG tablet Take by mouth. 09/20/14   [provider]  metFORMIN (GLUCOPHAGE) 500 MG tablet 1 BY MOUTH ONCE DAILY FOR DIABETES 09/06/17   [provider]  tiZANidine (ZANAFLEX) 4 MG tablet Take by mouth. 11/23/14   [provider]  traMADol (ULTRAM) 50 MG tablet Take 1 tablet (50 mg total) by mouth every 6 (six) hours as needed for severe pain. 05/13/17 09/19/17  Milinda Pointer, MD    No Known Allergies  Family History  Problem Relation Age of Onset  . Diabetes Mother   . Emphysema Brother   . Other Daughter  Scarcoidosis  . Aplastic anemia Daughter   . Kidney disease Daughter   . Cancer Daughter        Breast Bilateral Mastectomy age 69, kidney   . Breast cancer Cousin     Social History Social History   Tobacco Use  . Smoking status: Never Smoker  . Smokeless tobacco: Never Used  Substance Use Topics  . Alcohol use: No  . Drug use: No    Review of Systems Constitutional: Negative for fever. Cardiovascular: Positive for chest pain Respiratory:  Negative for shortness of breath. Gastrointestinal: Negative for abdominal pain.  Negative for nausea. Genitourinary: Negative for urinary compaints Musculoskeletal: Negative for leg pain or swelling. Skin: Negative for skin complaints  Neurological: Negative for headache All other ROS negative  ____________________________________________   PHYSICAL EXAM:  VITAL SIGNS: ED Triage Vitals  Enc Vitals Group     BP 12/16/17 0915 (!) 149/84     Pulse Rate 12/16/17 0915 78     Resp 12/16/17 0915 18     Temp 12/16/17 0915 98.6 F (37 C)     Temp Source 12/16/17 0915 Oral     SpO2 12/16/17 0915 96 %     Weight 12/16/17 0910 220 lb (99.8 kg)     Height 12/16/17 0910 5\' 3"  (1.6 m)     Head Circumference --      Peak Flow --      Pain Score 12/16/17 0910 7     Pain Loc --      Pain Edu? --      Excl. in [place]? --     Constitutional: Alert and oriented. Well appearing and in no distress. Eyes: Normal exam ENT   Head: Normocephalic and atraumatic.   Mouth/Throat: Mucous membranes are moist. Cardiovascular: Normal rate, regular rhythm. No murmur Respiratory: Normal respiratory effort without tachypnea nor retractions. Breath sounds are clear  Gastrointestinal: Soft and nontender. No distention.  Musculoskeletal: Nontender with normal range of motion in all extremities. No lower extremity tenderness or edema. Neurologic:  Normal speech and language. No gross focal neurologic deficits  Skin:  Skin is warm, dry and intact.  Psychiatric: Mood and affect are normal.   ____________________________________________    EKG  EKG reviewed and interpreted by myself shows normal sinus rhythm at 75 bpm with a narrow QRS, normal axis, normal intervals, no ST changes.  Reassuring EKG.  ____________________________________________    RADIOLOGY  CT angiography negative for PE.  ____________________________________________   INITIAL IMPRESSION / ASSESSMENT AND PLAN / ED  COURSE  Pertinent labs & imaging results that were available during my care of the patient were reviewed by me and considered in my medical decision making (see chart for details).  Patient presents to the emergency department for chest pain starting at 730 8:00 this morning, nearly resolved at this time but continues to have mild sharp pains with deep inspiration.  Differential would include ACS, PE, pneumonia, pneumothorax, chest wall pain.  We will check labs.  Given the patient's pleuritic nature to her chest pain along with near syncopal episode we will obtain CT angiography of the chest to help rule out pulmonary embolism.  Patient agreeable to this plan of care.  Labs are largely within normal limits including negative troponin.  We will repeat a second troponin.  CT scan of the chest is negative.  Repeat troponin is negative.  Patient is feeling much better.  States she felt considerably better after GI cocktail.  We will discharge with GI  follow-up.  I discussed use of Maalox I also discussed chest pain return precautions and follow-up with cardiology for further work-up and possible stress test.  Patient agreeable to plan of care.    ____________________________________________   FINAL CLINICAL IMPRESSION(S) / ED DIAGNOSES  Chest pain    Harvest Dark, MD 12/16/17 1353

## 2017-12-16 NOTE — ED Triage Notes (Signed)
L chest pain began approx 0730am while doing dishes. Pain increases with inspiration. States diaphoresis at onset. States pain was sharp.

## 2017-12-16 NOTE — ED Notes (Signed)
Report to Lorrie, RN  

## 2017-12-16 NOTE — ED Notes (Signed)
Pt in xray

## 2017-12-16 NOTE — ED Triage Notes (Signed)
FIRST NURSE NOTE-here for chest pain that started this AM. Was washing dishes and started with acute pain. Pulled for EKG

## 2017-12-17 ENCOUNTER — Ambulatory Visit: Payer: BLUE CROSS/BLUE SHIELD | Admitting: Pain Medicine

## 2017-12-17 NOTE — Progress Notes (Deleted)
Patient's Name: Meghan Vasquez  MRN: [id number]  Referring Provider: Milinda Pointer, MD  DOB: [birth year]  PCP: Donnie Coffin, MD  DOS: 12/17/2017  Note by: Gaspar Cola, MD  Service setting: Ambulatory outpatient  Specialty: Interventional Pain Management  Patient type: Established  Location: [facility] (AMB) Pain Management Facility  Visit type: Interventional Procedure   Primary Reason for Visit: Interventional Pain Management Treatment. CC: No chief complaint on file.  Procedure:          Anesthesia, Analgesia, Anxiolysis:  Type: Thermal Lumbar Facet, Medial Branch Radiofrequency Ablation/Neurotomy #1  Level: L2, L3, L4, L5, & S1 Medial Branch Level(s). These levels will denervate the L3-4, L4-5, and the L5-S1 lumbar facet joints. Primary Purpose: Therapeutic Region: Posterolateral Lumbosacral Spine Laterality: Right  Type: Moderate (Conscious) Sedation combined with Local Anesthesia Indication(s): Analgesia and Anxiety Route: Intravenous (IV) IV Access: Secured Sedation: Meaningful verbal contact was maintained at all times during the procedure  Local Anesthetic: Lidocaine 1-2%   Indications: 1. Spondylosis without myelopathy or radiculopathy, lumbar region   2. Lumbar facet syndrome (Bilateral) (R>L)   3. Lumbar facet osteoarthritis   4. Lumbar facet hypertrophy (Multilevel) (Bilateral)   5. Chronic low back pain (Primary Area of Pain) (Bilateral) (R>L)    [name] has been dealing with the above chronic pain for longer than three months and has either failed to respond, was unable to tolerate, or simply did not get enough benefit from other more conservative therapies including, but not limited to: 1. Over-the-counter medications 2. Anti-inflammatory medications 3. Muscle relaxants 4. Membrane stabilizers 5. Opioids 6. Physical therapy 7. Modalities (Heat, ice, etc.) 8. Invasive techniques such as nerve blocks. [name] has attained more than 50% relief of the pain  from a series of diagnostic injections conducted in separate occasions.  Pain Score: Pre-procedure:  /10 Post-procedure:  /10  Pre-op Assessment:  [name] is a 57 y.o. (year old), female patient, seen today for interventional treatment. She  has a past surgical history that includes etopic pregnancy and Colonoscopy (2013). [name] has a current medication list which includes the following prescription(s): cyclobenzaprine, ibuprofen, meclizine, meloxicam, metformin, tizanidine, and tramadol. Her primarily concern today is the No chief complaint on file.  Initial Vital Signs:  Pulse/HCG Rate:    Temp:   Resp:   BP:   SpO2:    BMI: Estimated body mass index is 38.97 kg/m as calculated from the following:   Height as of 12/16/17: 5\' 3"  (1.6 m).   Weight as of 12/16/17: 220 lb (99.8 kg).  Risk Assessment: Allergies: Reviewed. She has No Known Allergies.  Allergy Precautions: None required Coagulopathies: Reviewed. None identified.  Blood-thinner therapy: None at this time Active Infection(s): Reviewed. None identified. [name] is afebrile  Site Confirmation: [name] was asked to confirm the procedure and laterality before marking the site Procedure checklist: Completed Consent: Before the procedure and under the influence of no sedative(s), amnesic(s), or anxiolytics, the patient was informed of the treatment options, risks and possible complications. To fulfill our ethical and legal obligations, as recommended by the American Medical Association's Code of Ethics, I have informed the patient of my clinical impression; the nature and purpose of the treatment or procedure; the risks, benefits, and possible complications of the intervention; the alternatives, including doing nothing; the risk(s) and benefit(s) of the alternative treatment(s) or procedure(s); and the risk(s) and benefit(s) of doing nothing. The patient was provided information about the general risks and possible  complications associated  with the procedure. These may include, but are not limited to: failure to achieve desired goals, infection, bleeding, organ or nerve damage, allergic reactions, paralysis, and death. In addition, the patient was informed of those risks and complications associated to Spine-related procedures, such as failure to decrease pain; infection (i.e.: Meningitis, epidural or intraspinal abscess); bleeding (i.e.: epidural hematoma, subarachnoid hemorrhage, or any other type of intraspinal or peri-dural bleeding); organ or nerve damage (i.e.: Any type of peripheral nerve, nerve root, or spinal cord injury) with subsequent damage to sensory, motor, and/or autonomic systems, resulting in permanent pain, numbness, and/or weakness of one or several areas of the body; allergic reactions; (i.e.: anaphylactic reaction); and/or death. Furthermore, the patient was informed of those risks and complications associated with the medications. These include, but are not limited to: allergic reactions (i.e.: anaphylactic or anaphylactoid reaction(s)); adrenal axis suppression; blood sugar elevation that in diabetics may result in ketoacidosis or comma; water retention that in patients with history of congestive heart failure may result in shortness of breath, pulmonary edema, and decompensation with resultant heart failure; weight gain; swelling or edema; medication-induced neural toxicity; particulate matter embolism and blood vessel occlusion with resultant organ, and/or nervous system infarction; and/or aseptic necrosis of one or more joints. Finally, the patient was informed that Medicine is not an exact science; therefore, there is also the possibility of unforeseen or unpredictable risks and/or possible complications that may result in a catastrophic outcome. The patient indicated having understood very clearly. We have given the patient no guarantees and we have made no promises. Enough time was given to the  patient to ask questions, all of which were answered to the patient's satisfaction. [name] has indicated that she wanted to continue with the procedure. Attestation: I, the ordering provider, attest that I have discussed with the patient the benefits, risks, side-effects, alternatives, likelihood of achieving goals, and potential problems during recovery for the procedure that I have provided informed consent. Date  Time: {CHL [facility]-PAIN TIME CHOICES:21018001}  Pre-Procedure Preparation:  Monitoring: As per clinic protocol. Respiration, ETCO2, SpO2, BP, heart rate and rhythm monitor placed and checked for adequate function Safety Precautions: Patient was assessed for positional comfort and pressure points before starting the procedure. Time-out: I initiated and conducted the "Time-out" before starting the procedure, as per protocol. The patient was asked to participate by confirming the accuracy of the "Time Out" information. Verification of the correct person, site, and procedure were performed and confirmed by me, the nursing staff, and the patient. "Time-out" conducted as per Joint Commission's Universal Protocol (UP.01.01.01). Time:    Description of Procedure:          Position: Prone Laterality: Right Levels:  L2, L3, L4, L5, & S1 Medial Branch Level(s), at the L3-4, L4-5, and the L5-S1 lumbar facet joints. Area Prepped: Lumbosacral Prepping solution: ChloraPrep (2% chlorhexidine gluconate and 70% isopropyl alcohol) Safety Precautions: Aspiration looking for blood return was conducted prior to all injections. At no point did we inject any substances, as a needle was being advanced. Before injecting, the patient was told to immediately notify me if she was experiencing any new onset of "ringing in the ears, or metallic taste in the mouth". No attempts were made at seeking any paresthesias. Safe injection practices and needle disposal techniques used. Medications properly checked for  expiration dates. SDV (single dose vial) medications used. After the completion of the procedure, all disposable equipment used was discarded in the proper designated medical waste containers. Local Anesthesia: Protocol  guidelines were followed. The patient was positioned over the fluoroscopy table. The area was prepped in the usual manner. The time-out was completed. The target area was identified using fluoroscopy. A 12-in long, straight, sterile hemostat was used with fluoroscopic guidance to locate the targets for each level blocked. Once located, the skin was marked with an approved surgical skin marker. Once all sites were marked, the skin (epidermis, dermis, and hypodermis), as well as deeper tissues (fat, connective tissue and muscle) were infiltrated with a small amount of a short-acting local anesthetic, loaded on a 10cc syringe with a 25G, 1.5-in  Needle. An appropriate amount of time was allowed for local anesthetics to take effect before proceeding to the next step. Local Anesthetic: Lidocaine 2.0% The unused portion of the local anesthetic was discarded in the proper designated containers. Technical explanation of process:  Radiofrequency Ablation (RFA) L2 Medial Branch Nerve RFA: The target area for the L2 medial branch is at the junction of the postero-lateral aspect of the superior articular process and the superior, posterior, and medial edge of the transverse process of L3. Under fluoroscopic guidance, a Radiofrequency needle was inserted until contact was made with os over the superior postero-lateral aspect of the pedicular shadow (target area). Sensory and motor testing was conducted to properly adjust the position of the needle. Once satisfactory placement of the needle was achieved, the numbing solution was slowly injected after negative aspiration for blood. 2.0 mL of the nerve block solution was injected without difficulty or complication. After waiting for at least 3 minutes, the  ablation was performed. Once completed, the needle was removed intact. L3 Medial Branch Nerve RFA: The target area for the L3 medial branch is at the junction of the postero-lateral aspect of the superior articular process and the superior, posterior, and medial edge of the transverse process of L4. Under fluoroscopic guidance, a Radiofrequency needle was inserted until contact was made with os over the superior postero-lateral aspect of the pedicular shadow (target area). Sensory and motor testing was conducted to properly adjust the position of the needle. Once satisfactory placement of the needle was achieved, the numbing solution was slowly injected after negative aspiration for blood. 2.0 mL of the nerve block solution was injected without difficulty or complication. After waiting for at least 3 minutes, the ablation was performed. Once completed, the needle was removed intact. L4 Medial Branch Nerve RFA: The target area for the L4 medial branch is at the junction of the postero-lateral aspect of the superior articular process and the superior, posterior, and medial edge of the transverse process of L5. Under fluoroscopic guidance, a Radiofrequency needle was inserted until contact was made with os over the superior postero-lateral aspect of the pedicular shadow (target area). Sensory and motor testing was conducted to properly adjust the position of the needle. Once satisfactory placement of the needle was achieved, the numbing solution was slowly injected after negative aspiration for blood. 2.0 mL of the nerve block solution was injected without difficulty or complication. After waiting for at least 3 minutes, the ablation was performed. Once completed, the needle was removed intact. L5 Medial Branch Nerve RFA: The target area for the L5 medial branch is at the junction of the postero-lateral aspect of the superior articular process of S1 and the superior, posterior, and medial edge of the sacral ala.  Under fluoroscopic guidance, a Radiofrequency needle was inserted until contact was made with os over the superior postero-lateral aspect of the pedicular shadow (target area). Sensory  and motor testing was conducted to properly adjust the position of the needle. Once satisfactory placement of the needle was achieved, the numbing solution was slowly injected after negative aspiration for blood. 2.0 mL of the nerve block solution was injected without difficulty or complication. After waiting for at least 3 minutes, the ablation was performed. Once completed, the needle was removed intact. S1 Medial Branch Nerve RFA: The target area for the S1 medial branch is located inferior to the junction of the S1 superior articular process and the L5 inferior articular process, posterior, inferior, and lateral to the 6 o'clock position of the L5-S1 facet joint, just superior to the S1 posterior foramen. Under fluoroscopic guidance, the Radiofrequency needle was advanced until contact was made with os over the Target area. Sensory and motor testing was conducted to properly adjust the position of the needle. Once satisfactory placement of the needle was achieved, the numbing solution was slowly injected after negative aspiration for blood. 2.0 mL of the nerve block solution was injected without difficulty or complication. After waiting for at least 3 minutes, the ablation was performed. Once completed, the needle was removed intact. Radiofrequency lesioning (ablation):  Radiofrequency Generator: NeuroTherm [id number] Sensory Stimulation Parameters: 50 Hz was used to locate & identify the nerve, making sure that the needle was positioned such that there was no sensory stimulation below 0.3 V or above 0.7 V. Motor Stimulation Parameters: 2 Hz was used to evaluate the motor component. Care was taken not to lesion any nerves that demonstrated motor stimulation of the lower extremities at an output of less than 2.5 times that of the  sensory threshold, or a maximum of 2.0 V. Lesioning Technique Parameters: Standard Radiofrequency settings. (Not bipolar or pulsed.) Temperature Settings: 80 degrees C Lesioning time: 60 seconds Intra-operative Compliance: Compliant Materials & Medications: Needle(s) (Electrode/Cannula) Type: Teflon-coated, curved tip, Radiofrequency needle(s) Gauge: 22G Length: 10cm Numbing solution: 0.2% PF-Ropivacaine + Triamcinolone (40 mg/mL) diluted to a final concentration of 4 mg of Triamcinolone/mL of Ropivacaine The unused portion of the solution was discarded in the proper designated containers.  Once the entire procedure was completed, the treated area was cleaned, making sure to leave some of the prepping solution back to take advantage of its long term bactericidal properties.  Illustration of the posterior view of the lumbar spine and the posterior neural structures. Laminae of L2 through S1 are labeled. DPRL5, dorsal primary ramus of L5; DPRS1, dorsal primary ramus of S1; DPR3, dorsal primary ramus of L3; FJ, facet (zygapophyseal) joint L3-L4; I, inferior articular process of L4; LB1, lateral branch of dorsal primary ramus of L1; IAB, inferior articular branches from L3 medial branch (supplies L4-L5 facet joint); IBP, intermediate branch plexus; MB3, medial branch of dorsal primary ramus of L3; NR3, third lumbar nerve root; S, superior articular process of L5; SAB, superior articular branches from L4 (supplies L4-5 facet joint also); TP3, transverse process of L3.  There were no vitals filed for this visit.  Start Time:   hrs. End Time:   hrs.  Imaging Guidance (Spinal):          Type of Imaging Technique: Fluoroscopy Guidance (Spinal) Indication(s): Assistance in needle guidance and placement for procedures requiring needle placement in or near specific anatomical locations not easily accessible without such assistance. Exposure Time: Please see nurses notes. Contrast: None used. Fluoroscopic  Guidance: I was personally present during the use of fluoroscopy. "Tunnel Vision Technique" used to obtain the best possible view of the target area. Parallax  error corrected before commencing the procedure. "Direction-depth-direction" technique used to introduce the needle under continuous pulsed fluoroscopy. Once target was reached, antero-posterior, oblique, and lateral fluoroscopic projection used confirm needle placement in all planes. Images permanently stored in EMR. Interpretation: No contrast injected. I personally interpreted the imaging intraoperatively. Adequate needle placement confirmed in multiple planes. Permanent images saved into the patient's record.  Antibiotic Prophylaxis:   Anti-infectives (From admission, onward)   None     Indication(s): None identified  Post-operative Assessment:  Post-procedure Vital Signs:  Pulse/HCG Rate:    Temp:   Resp:   BP:   SpO2:    EBL: None  Complications: No immediate post-treatment complications observed by team, or reported by patient.  Note: The patient tolerated the entire procedure well. A repeat set of vitals were taken after the procedure and the patient was kept under observation following institutional policy, for this type of procedure. Post-procedural neurological assessment was performed, showing return to baseline, prior to discharge. The patient was provided with post-procedure discharge instructions, including a section on how to identify potential problems. Should any problems arise concerning this procedure, the patient was given instructions to immediately contact us, at any time, without hesitation. In any case, we plan to contact the patient by telephone for a follow-up status report regarding this interventional procedure.  Comments:  No additional relevant information.  Plan of Care   Imaging Orders  No imaging studies ordered today   Procedure Orders    No procedure(s) ordered today    Medications ordered  for procedure: No orders of the defined types were placed in this encounter.  Medications administered: [name] had no medications administered during this visit.  See the medical record for exact dosing, route, and time of administration.  New Prescriptions   No medications on file   Disposition: Discharge home  Discharge Date & Time: 12/17/2017;   hrs.   Physician-requested Follow-up: No follow-ups on file.  Future Appointments  Date Time Provider [place]  12/17/2017 10:30 AM Milinda Pointer, MD [facility] None   Primary Care Physician: Donnie Coffin, MD Location: [facility] Note by: Gaspar Cola, MD Date: 12/17/2017; Time: 6:24 AM  Disclaimer:  Medicine is not an Chief Strategy Officer. The only guarantee in medicine is that nothing is guaranteed. It is important to note that the decision to proceed with this intervention was based on the information collected from the patient. The Data and conclusions were drawn from the patient's questionnaire, the interview, and the physical examination. Because the information was provided in large part by the patient, it cannot be guaranteed that it has not been purposely or unconsciously manipulated. Every effort has been made to obtain as much relevant data as possible for this evaluation. It is important to note that the conclusions that lead to this procedure are derived in large part from the available data. Always take into account that the treatment will also be dependent on availability of resources and existing treatment guidelines, considered by other Pain Management Practitioners as being common knowledge and practice, at the time of the intervention. For Medico-Legal purposes, it is also important to point out that variation in procedural techniques and pharmacological choices are the acceptable norm. The indications, contraindications, technique, and results of the above procedure should only be  interpreted and judged by a Board-Certified Interventional Pain Specialist with extensive familiarity and expertise in the same exact procedure and technique.

## 2018-01-03 ENCOUNTER — Other Ambulatory Visit: Payer: Self-pay | Admitting: Family Medicine

## 2018-01-03 DIAGNOSIS — Z1231 Encounter for screening mammogram for malignant neoplasm of breast: Secondary | ICD-10-CM

## 2018-01-15 ENCOUNTER — Telehealth: Payer: Self-pay | Admitting: *Deleted

## 2018-02-04 ENCOUNTER — Ambulatory Visit: Payer: BLUE CROSS/BLUE SHIELD | Admitting: Pain Medicine

## 2019-03-12 ENCOUNTER — Emergency Department
Admission: EM | Admit: 2019-03-12 | Discharge: 2019-03-12 | Disposition: A | Discharge status: Home / Self Care | Payer: BC Managed Care – PPO | Attending: Emergency Medicine | Admitting: Emergency Medicine

## 2019-03-12 ENCOUNTER — Other Ambulatory Visit: Payer: Self-pay

## 2019-03-12 DIAGNOSIS — Z7984 Long term (current) use of oral hypoglycemic drugs: Secondary | ICD-10-CM | POA: Insufficient documentation

## 2019-03-12 DIAGNOSIS — Z79899 Other long term (current) drug therapy: Secondary | ICD-10-CM | POA: Insufficient documentation

## 2019-03-12 DIAGNOSIS — E119 Type 2 diabetes mellitus without complications: Secondary | ICD-10-CM | POA: Diagnosis not present

## 2019-03-12 DIAGNOSIS — R42 Dizziness and giddiness: Secondary | ICD-10-CM | POA: Insufficient documentation

## 2019-03-12 LAB — CBC
HCT: 34.6 % — ABNORMAL LOW (ref 36.0–46.0)
Hemoglobin: 11 g/dL — ABNORMAL LOW (ref 12.0–15.0)
MCH: 29.1 pg (ref 26.0–34.0)
MCHC: 31.8 g/dL (ref 30.0–36.0)
MCV: 91.5 fL (ref 80.0–100.0)
Platelets: 262 10*3/uL (ref 150–400)
RBC: 3.78 MIL/uL — ABNORMAL LOW (ref 3.87–5.11)
RDW: 14 % (ref 11.5–15.5)
WBC: 8.5 10*3/uL (ref 4.0–10.5)
nRBC: 0 % (ref 0.0–0.2)

## 2019-03-12 LAB — BASIC METABOLIC PANEL
Anion gap: 9 (ref 5–15)
BUN: 21 mg/dL — ABNORMAL HIGH (ref 6–20)
CO2: 27 mmol/L (ref 22–32)
Calcium: 9.7 mg/dL (ref 8.9–10.3)
Chloride: 103 mmol/L (ref 98–111)
Creatinine, Ser: 1.25 mg/dL — ABNORMAL HIGH (ref 0.44–1.00)
GFR calc Af Amer: 55 mL/min — ABNORMAL LOW (ref >60)
GFR calc non Af Amer: 48 mL/min — ABNORMAL LOW (ref >60)
Glucose, Bld: 136 mg/dL — ABNORMAL HIGH (ref 70–99)
Potassium: 4.6 mmol/L (ref 3.5–5.1)
Sodium: 139 mmol/L (ref 135–145)

## 2019-03-12 MED ORDER — MECLIZINE HCL 25 MG PO TABS
25.0000 mg | ORAL_TABLET | Freq: Once | ORAL | Status: AC
  Administered 2019-03-12: 15:00:00 25 mg via ORAL
  Filled 2019-03-12: qty 1

## 2019-03-12 MED ORDER — MECLIZINE HCL 25 MG PO TABS: 25.0000 mg | ORAL_TABLET | Freq: Three times a day (TID) | ORAL | 0 refills | Status: DC | PRN

## 2019-03-12 NOTE — ED Notes (Signed)
Pt reports dizziness that started today. Reports hx vertigo, reports ran out of "vertigo medicine".

## 2019-03-12 NOTE — Discharge Instructions (Addendum)
Use the Antivert 1 3 times a day as needed.  Please return if the symptoms change at all or if they are not improving.  Please follow-up with your regular doctor.

## 2019-03-12 NOTE — ED Triage Notes (Addendum)
Reports that she has had vertigo in the last few months, takes Antivert. State she feels that she is having vertigo today and is out of her Antivert. Dizziness with movement, states head feels heavy when she is moving.  Denies CP or SOB.  Pt alert and oriented X4, cooperative, RR even and unlabored, color WNL. Pt in NAD. Headache per patient.

## 2019-03-12 NOTE — ED Provider Notes (Addendum)
[facility] Emergency Department Provider Note   ____________________________________________   First MD Initiated Contact with Patient 03/12/19 1300     (approximate)  I have reviewed the triage vital signs and the nursing notes.   HISTORY  Chief Complaint Dizziness    HPI Meghan Vasquez is a 57 y.o. female who reports she has a lot of trouble with vertigo.  She often gets nauseated with it but usually does not vomit.  She usually takes Antivert with good results.  However she is out of her Antivert now.  She reports her doctors never told her she had any other problems.  She feels the same now as she always does when she has her vertigo.  There is nothing different about it at all.         Past Medical History:  Diagnosis Date  . Chronic sciatica   . Diabetes mellitus without complication ([place])   . Lumbar radiculopathy   . Sciatica    Workers Comp    Patient Active Problem List   Diagnosis Date Noted  . Spondylosis without myelopathy or radiculopathy, lumbar region 06/03/2017  . Lumbar facet hypertrophy (Multilevel) (Bilateral) 05/23/2017  . Lumbar facet syndrome (Bilateral) (R>L) 04/29/2017  . Osteoarthritis of knee (Left) 04/29/2017  . Tricompartment osteoarthritis of knee (Left) 04/29/2017  . DDD (degenerative disc disease), lumbar 04/29/2017  . Lumbar facet osteoarthritis 04/29/2017  . Osteoarthritis of  Lumbar spine 04/29/2017  . Chronic lumbar radicular pain (R) (L5) 04/29/2017  . Chronic low back pain (Primary Area of Pain) (Bilateral) (R>L) 04/17/2017  . Chronic lower extremity pain (Secondary Area of Pain) (Bilateral) (R>L) 04/17/2017  . Chronic knee pain [facility] Area of Pain) (Left) 04/17/2017  . Chronic pain syndrome 04/17/2017  . Disorder of bone, unspecified 04/17/2017  . Other specified health status 04/17/2017  . Other long term (current) drug therapy 04/17/2017  . Skin cyst 09/05/2015  . Breast cyst 09/05/2015  .  Anemia 02/01/2015  . 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 Perimenopause 01/31/2015  . 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 COLONOSCOPY  2013  . etopic pregnancy      Prior to Admission medications   Medication Sig Start Date End Date Taking? Authorizing Provider  cyclobenzaprine (FLEXERIL) 10 MG tablet Take by mouth. 09/20/14   [provider]  ibuprofen (ADVIL,MOTRIN) 200 MG tablet Take 200 mg by mouth every 6 (six) hours as needed.    [provider]  meclizine (ANTIVERT) 25 MG tablet Take 1 tablet (25 mg total) by mouth 3 (three) times daily as needed for dizziness. 09/09/17   Darel Hong, MD  meloxicam (MOBIC) 15 MG tablet Take by mouth. 09/20/14   [provider]  metFORMIN (GLUCOPHAGE) 500 MG tablet 1 BY MOUTH ONCE DAILY FOR DIABETES 09/06/17   [provider]  tiZANidine (ZANAFLEX) 4 MG tablet Take by mouth. 11/23/14   [provider]  traMADol (ULTRAM) 50 MG tablet Take 1 tablet (50 mg total) by mouth every 6 (six) hours as needed for severe pain. 05/13/17 09/19/17  Milinda Pointer, MD    Allergies Patient has no known allergies.  Family History  Problem Relation Age of Onset  . Diabetes Mother   . Emphysema Brother   . Other Daughter        Scarcoidosis  . Aplastic anemia Daughter   . Kidney disease Daughter   . Cancer Daughter  Breast Bilateral Mastectomy age 38, kidney   . Breast cancer Cousin     Social History Social History   Tobacco Use  . Smoking status: Never Smoker  . Smokeless tobacco: Never Used  Substance Use Topics  . Alcohol use: No  . Drug use: No    Review of Systems  Constitutional: No fever/chills Eyes: No visual changes. ENT: No sore throat. Cardiovascular: Denies chest pain. Respiratory: Denies shortness of breath. Gastrointestinal: No abdominal pain.   nausea, no vomiting.  No diarrhea.  No  constipation. Genitourinary: Negative for dysuria. Musculoskeletal: Negative for back pain. Skin: Negative for rash. Neurological: Negative for headaches, focal weakness   ____________________________________________   PHYSICAL EXAM:  VITAL SIGNS: ED Triage Vitals [03/12/19 1020]  Enc Vitals Group     BP (!) 151/81     Pulse Rate 86     Resp 18     Temp 99.2 F (37.3 C)     Temp Source Oral     SpO2 98 %     Weight 225 lb (102.1 kg)     Height 5\' 3"  (1.6 m)     Head Circumference      Peak Flow      Pain Score 7     Pain Loc      Pain Edu?      Excl. in [place]?     Constitutional: Alert and oriented. Well appearing and in no acute distress. Eyes: Conjunctivae are normal. PERRL. EOMI. Head: Atraumatic. Nose: No congestion/rhinnorhea. Mouth/Throat: Mucous membranes are moist.  Oropharynx non-erythematous. Neck: No stridor. Cardiovascular: Normal rate, regular rhythm. Grossly normal heart sounds.  Good peripheral circulation. Respiratory: Normal respiratory effort.  No retractions. Lungs CTAB. Gastrointestinal: Soft and nontender. No distention. No abdominal bruits. No CVA tenderness. Musculoskeletal: No lower extremity tenderness nor edema.   Neurologic:  Normal speech and language. No gross focal neurologic deficits are appreciated.  Cranial nerves II through XII are intact although visual fields were not checked and I did not check the 11th cranial nerve either.  Motor strength is 5/5 throughout finger-to-nose and  rapid alternating movements in the hands are normal.  Patient does not report any numbness. Skin:  Skin is warm, dry and intact. No rash noted. Psychiatric: Mood and affect are normal. Speech and behavior are normal.  ____________________________________________   LABS (all labs ordered are listed, but only abnormal results are displayed)  Labs Reviewed  CBC - Abnormal; Notable for the following components:      Result Value   RBC 3.78 (*)    Hemoglobin  11.0 (*)    HCT 34.6 (*)    All other components within normal limits  BASIC METABOLIC PANEL - Abnormal; Notable for the following components:   Glucose, Bld 136 (*)    BUN 21 (*)    Creatinine, Ser 1.25 (*)    GFR calc non Af Amer 48 (*)    GFR calc Af Amer 55 (*)    All other components within normal limits   ____________________________________________  EKG  EKG read interpreted by me shows normal sinus rhythm rate of 79 normal axis no acute ST-T wave changes ____________________________________________  RADIOLOGY  ED MD interpretation:    Official radiology report(s): No results found.  ____________________________________________   PROCEDURES  Procedure(s) performed (including Critical Care):  Procedures   ____________________________________________   INITIAL IMPRESSION / ASSESSMENT AND PLAN / ED COURSE  Patient feels comfortable that this is only her regular vertigo.  I will give  her some Antivert and let her go.  She will follow-up with her regular doctor.  Patient had a negative head CT in April of last year.           ____________________________________________   FINAL CLINICAL IMPRESSION(S) / ED DIAGNOSES  Final diagnoses:  Vertigo     ED Discharge Orders    None       Note:  This document was prepared using Dragon voice recognition software and may include unintentional dictation errors.    Nena Polio, MD 03/12/19 1346    Nena Polio, MD 03/12/19 [phone]

## 2019-04-30 ENCOUNTER — Encounter: Payer: Self-pay | Admitting: *Deleted

## 2019-04-30 ENCOUNTER — Telehealth: Payer: Self-pay | Admitting: Pain Medicine

## 2019-04-30 NOTE — Telephone Encounter (Signed)
Patient's primary care phys will not write tramadol until they get a letter stating she is no longer coming to pain clinic and no longer under contract for pain meds. Please send to Dr. Clide Deutscher at [facility] and her mychart

## 2019-04-30 NOTE — Telephone Encounter (Signed)
Informed patient that letter has been faxed to Dr. Phill Mutter office.

## 2019-05-22 ENCOUNTER — Other Ambulatory Visit: Payer: Self-pay | Admitting: Family Medicine

## 2019-05-22 DIAGNOSIS — Z1231 Encounter for screening mammogram for malignant neoplasm of breast: Secondary | ICD-10-CM

## 2019-05-25 ENCOUNTER — Encounter: Payer: Self-pay | Admitting: Family Medicine

## 2019-06-02 ENCOUNTER — Telehealth: Payer: Self-pay | Admitting: Gastroenterology

## 2019-06-02 NOTE — Telephone Encounter (Signed)
Pt left vm she is returning a call to schedule a colonoscopy

## 2019-06-03 NOTE — Telephone Encounter (Signed)
Returned patients call.  LVM for her to call office back to schedule colonoscopy.  Thanks,  [place], [place]

## 2019-06-09 ENCOUNTER — Other Ambulatory Visit: Payer: Self-pay

## 2019-06-09 ENCOUNTER — Telehealth: Payer: Self-pay | Admitting: Gastroenterology

## 2019-06-09 DIAGNOSIS — Z1211 Encounter for screening for malignant neoplasm of colon: Secondary | ICD-10-CM

## 2019-06-09 NOTE — Telephone Encounter (Signed)
Gastroenterology Pre-Procedure Review  Request Date: Thursday 06/18/19 Requesting Physician: Dr. Vicente Males  PATIENT REVIEW QUESTIONS: The patient responded to the following health history questions as indicated:    1. Are you having any GI issues? no 2. Do you have a personal history of Polyps? no 3. Do you have a family history of Colon Cancer or Polyps? no 4. Diabetes Mellitus? no 5. Joint replacements in the past 12 months?no 6. Major health problems in the past 3 months?no 7. Any artificial heart valves, MVP, or defibrillator?no    MEDICATIONS & ALLERGIES:    Patient reports the following regarding taking any anticoagulation/antiplatelet therapy:   Plavix, Coumadin, Eliquis, Xarelto, Lovenox, Pradaxa, Brilinta, or Effient? no Aspirin? no  Patient confirms/reports the following medications:  Current Outpatient Medications  Medication Sig Dispense Refill  . cyclobenzaprine (FLEXERIL) 10 MG tablet Take by mouth.    Marland Kitchen ibuprofen (ADVIL,MOTRIN) 200 MG tablet Take 200 mg by mouth every 6 (six) hours as needed.    . meclizine (ANTIVERT) 25 MG tablet Take 1 tablet (25 mg total) by mouth 3 (three) times daily as needed for dizziness. 30 tablet 0  . meclizine (ANTIVERT) 25 MG tablet Take 1 tablet (25 mg total) by mouth 3 (three) times daily as needed for dizziness. 30 tablet 0  . meloxicam (MOBIC) 15 MG tablet Take by mouth.    . metFORMIN (GLUCOPHAGE) 500 MG tablet 1 BY MOUTH ONCE DAILY FOR DIABETES  1  . tiZANidine (ZANAFLEX) 4 MG tablet Take by mouth.    . traMADol (ULTRAM) 50 MG tablet Take 1 tablet (50 mg total) by mouth every 6 (six) hours as needed for severe pain. 120 tablet 0   No current facility-administered medications for this visit.    Patient confirms/reports the following allergies:  No Known Allergies  No orders of the defined types were placed in this encounter.   AUTHORIZATION INFORMATION Primary Insurance: 1D#: Group #:  Secondary Insurance: 1D#: Group  #:  SCHEDULE INFORMATION: Date: Thursday 06/18/19 Time: Location:[facility]

## 2019-06-09 NOTE — Telephone Encounter (Signed)
PATIENT L/M ON V/M TO SCHEDULE A COLONOSCOPY

## 2019-06-12 ENCOUNTER — Telehealth: Payer: Self-pay | Admitting: Gastroenterology

## 2019-06-12 NOTE — Telephone Encounter (Signed)
Patient is calling l/mon v/m she did not get her instructions for her colonoscopy on 06-18-2019.

## 2019-06-15 ENCOUNTER — Telehealth: Payer: Self-pay | Admitting: Gastroenterology

## 2019-06-15 NOTE — Telephone Encounter (Signed)
Patient called & l/m on v/m stated she has a colonoscopy on 06-18-2019 & has not received any direction on what to do. Please call.

## 2019-06-15 NOTE — Telephone Encounter (Signed)
Patient stopping by office to pick up colonoscopy instructions.  Thanks,  [place], [place]

## 2019-06-15 NOTE — Telephone Encounter (Signed)
Returned patients call.  LVM for her to look in her mychart under the letters tab and she should see the instructions for her colonoscopy.  Also asked her to call me back to let me know what pharmacy to send her bowel prep to.  Thanks,  [place], [place]

## 2019-06-15 NOTE — Telephone Encounter (Signed)
Pt left vm she statets she has a colonoscopy this week and has not received anything in the mail she also states she left a message last Friday and has not heard anything. She states does she need to call her pharmacy?

## 2019-06-16 ENCOUNTER — Other Ambulatory Visit: Payer: Self-pay

## 2019-06-16 ENCOUNTER — Other Ambulatory Visit
Admission: RE | Admit: 2019-06-16 | Discharge: 2019-06-16 | Disposition: A | Discharge status: Home / Self Care | Payer: Medicare HMO | Source: Ambulatory Visit | Attending: Gastroenterology | Admitting: Gastroenterology

## 2019-06-16 DIAGNOSIS — Z01812 Encounter for preprocedural laboratory examination: Secondary | ICD-10-CM | POA: Insufficient documentation

## 2019-06-16 DIAGNOSIS — Z20822 Contact with and (suspected) exposure to covid-19: Secondary | ICD-10-CM | POA: Insufficient documentation

## 2019-06-16 LAB — SARS CORONAVIRUS 2 (TAT 6-24 HRS): SARS Coronavirus 2: NEGATIVE

## 2019-06-18 ENCOUNTER — Other Ambulatory Visit: Payer: Self-pay

## 2019-06-18 ENCOUNTER — Encounter: Payer: Self-pay | Admitting: Gastroenterology

## 2019-06-18 ENCOUNTER — Ambulatory Visit: Payer: Medicare HMO | Admitting: Registered Nurse

## 2019-06-18 ENCOUNTER — Encounter
Admission: RE | Disposition: A | Discharge status: Home / Self Care | Payer: Self-pay | Source: Home / Self Care | Attending: Gastroenterology

## 2019-06-18 ENCOUNTER — Ambulatory Visit
Admission: RE | Admit: 2019-06-18 | Discharge: 2019-06-18 | Disposition: A | Discharge status: Home / Self Care | Payer: Medicare HMO | Attending: Gastroenterology | Admitting: Gastroenterology

## 2019-06-18 DIAGNOSIS — E119 Type 2 diabetes mellitus without complications: Secondary | ICD-10-CM | POA: Insufficient documentation

## 2019-06-18 DIAGNOSIS — D122 Benign neoplasm of ascending colon: Secondary | ICD-10-CM | POA: Diagnosis not present

## 2019-06-18 DIAGNOSIS — M5430 Sciatica, unspecified side: Secondary | ICD-10-CM | POA: Diagnosis not present

## 2019-06-18 DIAGNOSIS — D126 Benign neoplasm of colon, unspecified: Secondary | ICD-10-CM

## 2019-06-18 DIAGNOSIS — C7A026 Malignant carcinoid tumor of the rectum: Secondary | ICD-10-CM | POA: Diagnosis not present

## 2019-06-18 DIAGNOSIS — Z7984 Long term (current) use of oral hypoglycemic drugs: Secondary | ICD-10-CM | POA: Diagnosis not present

## 2019-06-18 DIAGNOSIS — Z1211 Encounter for screening for malignant neoplasm of colon: Secondary | ICD-10-CM | POA: Insufficient documentation

## 2019-06-18 DIAGNOSIS — Z791 Long term (current) use of non-steroidal anti-inflammatories (NSAID): Secondary | ICD-10-CM | POA: Diagnosis not present

## 2019-06-18 DIAGNOSIS — Z833 Family history of diabetes mellitus: Secondary | ICD-10-CM | POA: Insufficient documentation

## 2019-06-18 HISTORY — PX: COLONOSCOPY WITH PROPOFOL: SHX5780

## 2019-06-18 LAB — GLUCOSE, CAPILLARY: Glucose-Capillary: 111 mg/dL — ABNORMAL HIGH (ref 70–99)

## 2019-06-18 SURGERY — COLONOSCOPY WITH PROPOFOL
Anesthesia: General

## 2019-06-18 MED ORDER — PROPOFOL 500 MG/50ML IV EMUL
INTRAVENOUS | Status: AC
  Filled 2019-06-18: qty 100

## 2019-06-18 MED ORDER — PROPOFOL 500 MG/50ML IV EMUL
INTRAVENOUS | Status: DC | PRN
  Administered 2019-06-18: 150 ug/kg/min via INTRAVENOUS

## 2019-06-18 MED ORDER — SODIUM CHLORIDE 0.9 % IV SOLN: INTRAVENOUS | Status: DC

## 2019-06-18 MED ORDER — PROPOFOL 10 MG/ML IV BOLUS
INTRAVENOUS | Status: DC | PRN
  Administered 2019-06-18: 100 mg via INTRAVENOUS

## 2019-06-18 MED ORDER — LIDOCAINE HCL (CARDIAC) PF 100 MG/5ML IV SOSY
PREFILLED_SYRINGE | INTRAVENOUS | Status: DC | PRN
  Administered 2019-06-18: 100 mg via INTRAVENOUS

## 2019-06-18 NOTE — Anesthesia Postprocedure Evaluation (Signed)
Anesthesia Post Note  Patient: Meghan Vasquez  Procedure(s) Performed: COLONOSCOPY WITH PROPOFOL (N/A )  Patient location during evaluation: Endoscopy Anesthesia Type: General Level of consciousness: awake and alert Pain management: pain level controlled Vital Signs Assessment: post-procedure vital signs reviewed and stable Respiratory status: spontaneous breathing and respiratory function stable Cardiovascular status: stable Anesthetic complications: no     Last Vitals:  Vitals:   06/18/19 0900 06/18/19 0910  BP: 130/77 132/86  Pulse: 90 92  Resp: 16 (!) 22  Temp: 36.5 C   SpO2: 98% 99%    Last Pain:  Vitals:   06/18/19 0900  TempSrc: Temporal  PainSc:                  [name]

## 2019-06-18 NOTE — Transfer of Care (Signed)
Immediate Anesthesia Transfer of Care Note  Patient: Meghan Vasquez  Procedure(s) Performed: COLONOSCOPY WITH PROPOFOL (N/A )  Patient Location: PACU  Anesthesia Type:General  Level of Consciousness: drowsy  Airway & Oxygen Therapy: Patient Spontanous Breathing  Post-op Assessment: Report given to RN and Post -op Vital signs reviewed and stable  Post vital signs: Reviewed and stable  Last Vitals:  Vitals Value Taken Time  BP 130/77 0906  Temp    Pulse 92 06/18/19 0906  Resp 15 06/18/19 0906  SpO2 98 % 06/18/19 0906  Vitals shown include unvalidated device data.  Last Pain:  Vitals:   06/18/19 0749  TempSrc: Temporal  PainSc: 0-No pain         Complications: No apparent anesthesia complications

## 2019-06-18 NOTE — Op Note (Addendum)
[facility] Gastroenterology Patient Name: Meghan Vasquez Procedure Date: 06/18/2019 8:40 AM MRN: [id number] Account #: [id number] Date of Birth: [birth year] Admit Type: Outpatient Age: 59 Room: [facility] ENDO ROOM 2 Gender: Female Note Status: Finalized Procedure:             Colonoscopy Indications:           Screening for colorectal malignant neoplasm Providers:             Jonathon Bellows MD, MD Referring MD:          Edmonia Lynch. Aycock MD (Referring MD) Medicines:             Monitored Anesthesia Care Complications:         No immediate complications. Procedure:             Pre-Anesthesia Assessment:                        - Prior to the procedure, a History and Physical was                         performed, and patient medications, allergies and                         sensitivities were reviewed. The patient's tolerance                         of previous anesthesia was reviewed.                        - The risks and benefits of the procedure and the                         sedation options and risks were discussed with the                         patient. All questions were answered and informed                         consent was obtained.                        - ASA Grade Assessment: II - A patient with mild                         systemic disease.                        After obtaining informed consent, the colonoscope was                         passed under direct vision. Throughout the procedure,                         the patient's blood pressure, pulse, and oxygen                         saturations were monitored continuously. The                         Colonoscope was introduced through the anus  and                         advanced to the the cecum, identified by the                         appendiceal orifice. The colonoscopy was performed                         with ease. The patient tolerated the procedure well.                         The quality of  the bowel preparation was excellent. Findings:      The perianal and digital rectal examinations were normal.      Two sessile polyps were found in the rectum and ascending colon. The       polyps were 5 to 8 mm in size. These polyps were removed with a cold       snare. Resection and retrieval were complete.      The exam was otherwise without abnormality. Impression:            - Two 5 to 8 mm polyps in the rectum and in the                         ascending colon, removed with a cold snare. Resected                         and retrieved.                        - The examination was otherwise normal. Recommendation:        - Discharge patient to home (with escort).                        - Resume previous diet.                        - Continue present medications.                        - Await pathology results.                        - Repeat colonoscopy for surveillance based on                         pathology results. Procedure Code(s):     --- Professional ---                        [phone], Colonoscopy, flexible; with removal of                         tumor(s), polyp(s), or other lesion(s) by snare                         technique Diagnosis Code(s):     --- Professional ---                        Z12.11, Encounter for screening for malignant neoplasm  of colon                        K62.1, Rectal polyp                        K63.5, Polyp of colon CPT copyright 2019 American Medical Association. All rights reserved. The codes documented in this report are preliminary and upon coder review may  be revised to meet current compliance requirements. Jonathon Bellows, MD Jonathon Bellows MD, MD 06/18/2019 9:01:29 AM This report has been signed electronically. Number of Addenda: 0 Note Initiated On: 06/18/2019 8:40 AM Scope Withdrawal Time: 0 hours 11 minutes 51 seconds  Total Procedure Duration: 0 hours 15 minutes 30 seconds  Estimated Blood Loss:  Estimated blood  loss: none.      [facility]

## 2019-06-18 NOTE — H&P (Signed)
Jonathon Bellows, MD [address], [place], [place], [place], [address] [address], [place], [place], [place], [address] Phone: [phone]  Fax: [phone]  Primary Care Physician:  Donnie Coffin, MD   Pre-Procedure History & Physical: HPI:  Meghan Vasquez is a 59 y.o. female is here for an colonoscopy.   Past Medical History:  Diagnosis Date  . Chronic sciatica   . Diabetes mellitus without complication ([place])   . Lumbar radiculopathy   . Sciatica    Workers Comp    Past Surgical History:  Procedure Laterality Date  . COLONOSCOPY  2013  . etopic pregnancy      Prior to Admission medications   Medication Sig Start Date End Date Taking? Authorizing Provider  cyclobenzaprine (FLEXERIL) 10 MG tablet Take by mouth. 09/20/14  Yes [provider]  ibuprofen (ADVIL,MOTRIN) 200 MG tablet Take 200 mg by mouth every 6 (six) hours as needed.   Yes [provider]  meclizine (ANTIVERT) 25 MG tablet Take 1 tablet (25 mg total) by mouth 3 (three) times daily as needed for dizziness. 09/09/17  Yes Darel Hong, MD  meclizine (ANTIVERT) 25 MG tablet Take 1 tablet (25 mg total) by mouth 3 (three) times daily as needed for dizziness. 03/12/19  Yes Nena Polio, MD  meloxicam (MOBIC) 15 MG tablet Take by mouth. 09/20/14  Yes [provider]  metFORMIN (GLUCOPHAGE) 500 MG tablet 1 BY MOUTH ONCE DAILY FOR DIABETES 09/06/17  Yes [provider]  tiZANidine (ZANAFLEX) 4 MG tablet Take by mouth. 11/23/14  Yes [provider]  traMADol (ULTRAM) 50 MG tablet Take 1 tablet (50 mg total) by mouth every 6 (six) hours as needed for severe pain. 05/13/17 09/19/17  Milinda Pointer, MD    Allergies as of 06/09/2019  . (No Known Allergies)    Family History  Problem Relation Age of Onset  . Diabetes Mother   . Emphysema Brother   . Other Daughter        Scarcoidosis  . Aplastic anemia Daughter   . Kidney disease Daughter   . Cancer Daughter     Breast Bilateral Mastectomy age 1, kidney   . Breast cancer Cousin     Social History   Socioeconomic History  . Marital status: Married    Spouse name: Not on file  . Number of children: Not on file  . Years of education: Not on file  . Highest education level: Not on file  Occupational History  . Not on file  Tobacco Use  . Smoking status: Never Smoker  . Smokeless tobacco: Never Used  Substance and Sexual Activity  . Alcohol use: No  . Drug use: No  . Sexual activity: Never    Birth control/protection: None  Other Topics Concern  . Not on file  Social History Narrative  . Not on file   Social Determinants of Health   Financial Resource Strain:   . Difficulty of Paying Living Expenses: Not on file  Food Insecurity:   . Worried About Charity fundraiser in the Last Year: Not on file  . Ran Out of Food in the Last Year: Not on file  Transportation Needs:   . Lack of Transportation (Medical): Not on file  . Lack of Transportation (Non-Medical): Not on file  Physical Activity:   . Days of Exercise per Week: Not on file  . Minutes of Exercise per Session: Not on file  Stress:   . Feeling  of Stress : Not on file  Social Connections:   . Frequency of Communication with Friends and Family: Not on file  . Frequency of Social Gatherings with Friends and Family: Not on file  . Attends Religious Services: Not on file  . Active Member of Clubs or Organizations: Not on file  . Attends Archivist Meetings: Not on file  . Marital Status: Not on file  Intimate Partner Violence:   . Fear of Current or Ex-Partner: Not on file  . Emotionally Abused: Not on file  . Physically Abused: Not on file  . Sexually Abused: Not on file    Review of Systems: See HPI, otherwise negative ROS  Physical Exam: BP (!) 160/112   Pulse 88   Temp 97.7 F (36.5 C) (Temporal)   Resp 16   Ht 5\' 3"  (1.6 m)   Wt 100.7 kg   SpO2 97%   BMI 39.33 kg/m  General:   Alert,  pleasant  and cooperative in NAD Head:  Normocephalic and atraumatic. Neck:  Supple; no masses or thyromegaly. Lungs:  Clear throughout to auscultation, normal respiratory effort.    Heart:  +S1, +S2, Regular rate and rhythm, No edema. Abdomen:  Soft, nontender and nondistended. Normal bowel sounds, without guarding, and without rebound.   Neurologic:  Alert and  oriented x4;  grossly normal neurologically.  Impression/Plan: Meghan Vasquez is here for an colonoscopy to be performed for Screening colonoscopy average risk   Risks, benefits, limitations, and alternatives regarding  colonoscopy have been reviewed with the patient.  Questions have been answered.  All parties agreeable.   Jonathon Bellows, MD  06/18/2019, 8:35 AM

## 2019-06-18 NOTE — Anesthesia Preprocedure Evaluation (Signed)
Anesthesia Evaluation  Patient identified by MRN, date of birth, ID band Patient awake    Reviewed: Allergy & Precautions, NPO status , Patient's Chart, lab work & pertinent test results  History of Anesthesia Complications Negative for: history of anesthetic complications  Airway Mallampati: III       Dental   Pulmonary neg sleep apnea, neg COPD,           Cardiovascular (-) hypertension(-) Past MI and (-) CHF (-) dysrhythmias (-) Valvular Problems/Murmurs     Neuro/Psych neg Seizures    GI/Hepatic Neg liver ROS, GERD  Medicated and Controlled,  Endo/Other  diabetes, Type 2, Oral Hypoglycemic Agents  Renal/GU Renal InsufficiencyRenal disease     Musculoskeletal   Abdominal   Peds  Hematology   Anesthesia Other Findings   Reproductive/Obstetrics                             Anesthesia Physical Anesthesia Plan  ASA: III  Anesthesia Plan: General   Post-op Pain Management:    Induction: Intravenous  PONV Risk Score and Plan: 3 and Propofol infusion, TIVA and Treatment may vary due to age or medical condition  Airway Management Planned: Nasal Cannula  Additional Equipment:   Intra-op Plan:   Post-operative Plan:   Informed Consent: I have reviewed the patients History and Physical, chart, labs and discussed the procedure including the risks, benefits and alternatives for the proposed anesthesia with the patient or authorized representative who has indicated his/her understanding and acceptance.       Plan Discussed with:   Anesthesia Plan Comments:         Anesthesia Quick Evaluation

## 2019-06-19 ENCOUNTER — Encounter: Payer: Self-pay | Admitting: *Deleted

## 2019-06-22 ENCOUNTER — Other Ambulatory Visit: Payer: Self-pay | Admitting: Anatomic Pathology & Clinical Pathology

## 2019-06-22 LAB — SURGICAL PATHOLOGY

## 2019-06-23 ENCOUNTER — Ambulatory Visit (INDEPENDENT_AMBULATORY_CARE_PROVIDER_SITE_OTHER): Payer: Medicare HMO | Admitting: Gastroenterology

## 2019-06-23 ENCOUNTER — Other Ambulatory Visit
Admission: RE | Admit: 2019-06-23 | Discharge: 2019-06-23 | Disposition: A | Discharge status: Home / Self Care | Payer: Medicare HMO | Source: Ambulatory Visit | Attending: Gastroenterology | Admitting: Gastroenterology

## 2019-06-23 ENCOUNTER — Other Ambulatory Visit: Payer: Self-pay

## 2019-06-23 DIAGNOSIS — D3A026 Benign carcinoid tumor of the rectum: Secondary | ICD-10-CM

## 2019-06-23 DIAGNOSIS — Z20822 Contact with and (suspected) exposure to covid-19: Secondary | ICD-10-CM | POA: Diagnosis not present

## 2019-06-23 DIAGNOSIS — Z01812 Encounter for preprocedural laboratory examination: Secondary | ICD-10-CM | POA: Insufficient documentation

## 2019-06-23 NOTE — Progress Notes (Signed)
Meghan Vasquez  [address]  [place], [place] [address]  Main: [phone]  Fax: [phone]   Gastroenterology Consultation  Referring Provider:     Donnie Coffin, MD Primary Care Physician:  Donnie Coffin, MD Reason for Consultation: Discuss results of recent colonoscopy.        HPI:   Virtual Visit via Telephone Note  I connected with patient on 06/23/19 at 11:00 AM EST by telephone and verified that I am speaking with the correct person using two identifiers.   I discussed the limitations, risks, security and privacy concerns of performing an evaluation and management service by telephone and the availability of in person appointments. I also discussed with the patient that there may be a patient responsible charge related to this service. The patient expressed understanding and agreed to proceed.  Location of the patient: Home Location of provider: Home Participating persons: Patient and provider only   History of Present Illness:   Discussed results of recent colonoscopy whom I performed a colonoscopy on 06/18/2019 for colon cancer screening average risk.  2 sessile polyps were found in the ascending colon and rectum.  Ranging from 5 to 8 mm.  Both were removed with a cold snare.  Endoscopically they appeared to be resected completely.  This phone call us to discuss the results of the Pap colonoscopy and biopsy results.  The polyp in the ascending colon returned back as a tubular adenoma with no high-grade dysplasia.  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  There was tumor present at the edge of the biopsy.   Past Medical History:  Diagnosis Date  . Chronic sciatica   . Diabetes mellitus without complication ([place])   . Lumbar radiculopathy   . Sciatica    Workers Comp    Past Surgical History:  Procedure Laterality Date  . COLONOSCOPY  2013  . COLONOSCOPY WITH PROPOFOL N/A  06/18/2019   Procedure: COLONOSCOPY WITH PROPOFOL;  Surgeon: Meghan Bellows, MD;  Location: [facility] ENDOSCOPY;  Service: Gastroenterology;  Laterality: N/A;  . etopic pregnancy      Prior to Admission medications   Medication Sig Start Date End Date Taking? Authorizing Provider  cyclobenzaprine (FLEXERIL) 10 MG tablet Take by mouth. 09/20/14   [provider]  ibuprofen (ADVIL,MOTRIN) 200 MG tablet Take 200 mg by mouth every 6 (six) hours as needed.    [provider]  meclizine (ANTIVERT) 25 MG tablet Take 1 tablet (25 mg total) by mouth 3 (three) times daily as needed for dizziness. 09/09/17   Darel Hong, MD  meclizine (ANTIVERT) 25 MG tablet Take 1 tablet (25 mg total) by mouth 3 (three) times daily as needed for dizziness. 03/12/19   Nena Polio, MD  meloxicam (MOBIC) 15 MG tablet Take by mouth. 09/20/14   [provider]  metFORMIN (GLUCOPHAGE) 500 MG tablet 1 BY MOUTH ONCE DAILY FOR DIABETES 09/06/17   [provider]  tiZANidine (ZANAFLEX) 4 MG tablet Take by mouth. 11/23/14   [provider]  traMADol (ULTRAM) 50 MG tablet Take 1 tablet (50 mg total) by mouth every 6 (six) hours as needed for severe pain. 05/13/17 09/19/17  Milinda Pointer, MD    Family History  Problem Relation Age of Onset  . Diabetes Mother   . Emphysema Brother   . Other Daughter        Scarcoidosis  . Aplastic anemia Daughter   .  Kidney disease Daughter   . Cancer Daughter        Breast Bilateral Mastectomy age 93, kidney   . Breast cancer Cousin      Social History   Tobacco Use  . Smoking status: Never Smoker  . Smokeless tobacco: Never Used  Substance Use Topics  . Alcohol use: No  . Drug use: No    Allergies as of 06/23/2019  . (No Known Allergies)    Review of Systems:    All systems reviewed and negative except where noted in HPI.   Observations/Objective:  Labs: CBC    Component Value Date/Time   WBC 8.5 03/12/2019 1023   RBC 3.78 (L)  03/12/2019 1023   HGB 11.0 (L) 03/12/2019 1023   HGB 11.8 12/14/2015 1352   HCT 34.6 (L) 03/12/2019 1023   HCT 35.0 12/14/2015 1352   PLT 262 03/12/2019 1023   PLT 249 12/14/2015 1352   MCV 91.5 03/12/2019 1023   MCV 91 12/14/2015 1352   MCH 29.1 03/12/2019 1023   MCHC 31.8 03/12/2019 1023   RDW 14.0 03/12/2019 1023   RDW 14.1 12/14/2015 1352   LYMPHSABS 3.5 (H) 12/14/2015 1352   EOSABS 0.2 07/01/2015 0951   BASOSABS 0.0 07/01/2015 0951   CMP     Component Value Date/Time   NA 139 03/12/2019 1023   NA 142 04/17/2017 1436   K 4.6 03/12/2019 1023   CL 103 03/12/2019 1023   CO2 27 03/12/2019 1023   GLUCOSE 136 (H) 03/12/2019 1023   BUN 21 (H) 03/12/2019 1023   BUN 17 04/17/2017 1436   CREATININE 1.25 (H) 03/12/2019 1023   CALCIUM 9.7 03/12/2019 1023   PROT 7.6 12/16/2017 1002   PROT 7.5 04/17/2017 1436   ALBUMIN 4.1 12/16/2017 1002   ALBUMIN 4.6 04/17/2017 1436   AST 16 12/16/2017 1002   ALT 16 12/16/2017 1002   ALKPHOS 66 12/16/2017 1002   BILITOT 0.5 12/16/2017 1002   BILITOT <0.2 04/17/2017 1436   GFRNONAA 48 (L) 03/12/2019 1023   GFRAA 55 (L) 03/12/2019 1023    Imaging Studies: No results found.  Assessment and Plan:   [name] is a 59 y.o. y/o female here to discuss results of her recent colonoscopy where a polyp taken out in the rectum did return back as a well-differentiated neuroendocrine tumor.  There was tumor tissue present at the margin of the specimen for pathology.  Plan 1.  Sigmoidoscopy tomorrow and unsedated to examine the resected site and if needed take biopsies from the edges as well as to tattoo the area.Rectal NETs that are smaller than 1 cm and confined to the mucosa or submucosa (T1) can be treated by local endoscopic excision.  2. Will refer to Dr Tasia Catchings to see if she has any further recommendations for surveillance.    I have discussed alternative options, risks & benefits,  which include, but are not limited to, bleeding, infection,  perforation,respiratory complication & drug reaction.  The patient agrees with this plan & written consent will be obtained.    I discussed the assessment and treatment plan with the patient. The patient was provided an opportunity to ask questions and all were answered. The patient agreed with the plan and demonstrated an understanding of the instructions.   The patient was advised to call back or seek an in-person evaluation if the symptoms worsen or if the condition fails to improve as anticipated.  I provided 15 minutes of non-face-to-face time during this encounter.  Dr Meghan Bellows MD,MRCP [facility]) Gastroenterology/Hepatology Pager: [phone]   Speech recognition software was used to dictate the above note.

## 2019-06-24 ENCOUNTER — Ambulatory Visit
Admission: RE | Admit: 2019-06-24 | Discharge: 2019-06-24 | Disposition: A | Discharge status: Home / Self Care | Payer: Medicare HMO | Attending: Gastroenterology | Admitting: Gastroenterology

## 2019-06-24 ENCOUNTER — Encounter: Payer: Self-pay | Admitting: Gastroenterology

## 2019-06-24 ENCOUNTER — Encounter
Admission: RE | Disposition: A | Discharge status: Home / Self Care | Payer: Self-pay | Source: Home / Self Care | Attending: Gastroenterology

## 2019-06-24 DIAGNOSIS — Z86010 Personal history of colonic polyps: Secondary | ICD-10-CM | POA: Insufficient documentation

## 2019-06-24 DIAGNOSIS — K626 Ulcer of anus and rectum: Secondary | ICD-10-CM | POA: Insufficient documentation

## 2019-06-24 DIAGNOSIS — E119 Type 2 diabetes mellitus without complications: Secondary | ICD-10-CM | POA: Diagnosis not present

## 2019-06-24 DIAGNOSIS — Z833 Family history of diabetes mellitus: Secondary | ICD-10-CM | POA: Insufficient documentation

## 2019-06-24 DIAGNOSIS — Z841 Family history of disorders of kidney and ureter: Secondary | ICD-10-CM | POA: Diagnosis not present

## 2019-06-24 DIAGNOSIS — Z832 Family history of diseases of the blood and blood-forming organs and certain disorders involving the immune mechanism: Secondary | ICD-10-CM | POA: Diagnosis not present

## 2019-06-24 DIAGNOSIS — Z791 Long term (current) use of non-steroidal anti-inflammatories (NSAID): Secondary | ICD-10-CM | POA: Insufficient documentation

## 2019-06-24 DIAGNOSIS — Z7984 Long term (current) use of oral hypoglycemic drugs: Secondary | ICD-10-CM | POA: Diagnosis not present

## 2019-06-24 DIAGNOSIS — Z1211 Encounter for screening for malignant neoplasm of colon: Secondary | ICD-10-CM | POA: Diagnosis not present

## 2019-06-24 DIAGNOSIS — D3A026 Benign carcinoid tumor of the rectum: Secondary | ICD-10-CM

## 2019-06-24 DIAGNOSIS — Z79899 Other long term (current) drug therapy: Secondary | ICD-10-CM | POA: Diagnosis not present

## 2019-06-24 DIAGNOSIS — M5430 Sciatica, unspecified side: Secondary | ICD-10-CM | POA: Insufficient documentation

## 2019-06-24 DIAGNOSIS — Z803 Family history of malignant neoplasm of breast: Secondary | ICD-10-CM | POA: Diagnosis not present

## 2019-06-24 DIAGNOSIS — Z825 Family history of asthma and other chronic lower respiratory diseases: Secondary | ICD-10-CM | POA: Insufficient documentation

## 2019-06-24 DIAGNOSIS — Z808 Family history of malignant neoplasm of other organs or systems: Secondary | ICD-10-CM | POA: Diagnosis not present

## 2019-06-24 DIAGNOSIS — M5416 Radiculopathy, lumbar region: Secondary | ICD-10-CM | POA: Diagnosis not present

## 2019-06-24 HISTORY — PX: FLEXIBLE SIGMOIDOSCOPY: SHX5431

## 2019-06-24 LAB — GLUCOSE, CAPILLARY: Glucose-Capillary: 117 mg/dL — ABNORMAL HIGH (ref 70–99)

## 2019-06-24 LAB — SARS CORONAVIRUS 2 (TAT 6-24 HRS): SARS Coronavirus 2: NEGATIVE

## 2019-06-24 SURGERY — SIGMOIDOSCOPY, FLEXIBLE

## 2019-06-24 MED ORDER — SPOT INK MARKER SYRINGE KIT
PACK | SUBMUCOSAL | Status: DC | PRN
  Administered 2019-06-24: 4 mL via SUBMUCOSAL

## 2019-06-24 MED ORDER — SODIUM CHLORIDE 0.9 % IV SOLN: INTRAVENOUS | Status: DC

## 2019-06-24 NOTE — H&P (Signed)
Jonathon Bellows, MD [address] [place], [place], [place], [address] [address], [place], [place], [place], [address] Phone: [phone]  Fax: [phone]  Primary Care Physician:  Donnie Coffin, MD   Pre-Procedure History & Physical: HPI:  Meghan Vasquez is a 59 y.o. female is here for a sigmoidoscopy    Past Medical History:  Diagnosis Date  . Chronic sciatica   . Diabetes mellitus without complication ([place])   . Lumbar radiculopathy   . Sciatica    Workers Comp    Past Surgical History:  Procedure Laterality Date  . COLONOSCOPY  2013  . COLONOSCOPY WITH PROPOFOL N/A 06/18/2019   Procedure: COLONOSCOPY WITH PROPOFOL;  Surgeon: Jonathon Bellows, MD;  Location: [facility] ENDOSCOPY;  Service: Gastroenterology;  Laterality: N/A;  . etopic pregnancy      Prior to Admission medications   Medication Sig Start Date End Date Taking? Authorizing Provider  cyclobenzaprine (FLEXERIL) 10 MG tablet Take by mouth. 09/20/14  Yes [provider]  metFORMIN (GLUCOPHAGE) 500 MG tablet 1 BY MOUTH ONCE DAILY FOR DIABETES 09/06/17  Yes [provider]  ibuprofen (ADVIL,MOTRIN) 200 MG tablet Take 200 mg by mouth every 6 (six) hours as needed.    [provider]  meclizine (ANTIVERT) 25 MG tablet Take 1 tablet (25 mg total) by mouth 3 (three) times daily as needed for dizziness. 09/09/17   Darel Hong, MD  meclizine (ANTIVERT) 25 MG tablet Take 1 tablet (25 mg total) by mouth 3 (three) times daily as needed for dizziness. 03/12/19   Nena Polio, MD  meloxicam (MOBIC) 15 MG tablet Take by mouth. 09/20/14   [provider]  tiZANidine (ZANAFLEX) 4 MG tablet Take by mouth. 11/23/14   [provider]  traMADol (ULTRAM) 50 MG tablet Take 1 tablet (50 mg total) by mouth every 6 (six) hours as needed for severe pain. 05/13/17 09/19/17  Milinda Pointer, MD    Allergies as of 06/23/2019  . (No Known Allergies)    Family History  Problem Relation Age of Onset  .  Diabetes Mother   . Emphysema Brother   . Other Daughter        Scarcoidosis  . Aplastic anemia Daughter   . Kidney disease Daughter   . Cancer Daughter        Breast Bilateral Mastectomy age 17, kidney   . Breast cancer Cousin     Social History   Socioeconomic History  . Marital status: Married    Spouse name: Not on file  . Number of children: Not on file  . Years of education: Not on file  . Highest education level: Not on file  Occupational History  . Not on file  Tobacco Use  . Smoking status: Never Smoker  . Smokeless tobacco: Never Used  Substance and Sexual Activity  . Alcohol use: No  . Drug use: No  . Sexual activity: Never    Birth control/protection: None  Other Topics Concern  . Not on file  Social History Narrative  . Not on file   Social Determinants of Health   Financial Resource Strain:   . Difficulty of Paying Living Expenses: Not on file  Food Insecurity:   . Worried About Charity fundraiser in the Last Year: Not on file  . Ran Out of Food in the Last Year: Not on file  Transportation Needs:   . Lack of Transportation (Medical): Not on file  . Lack of Transportation (Non-Medical): Not on file  Physical Activity:   . Days of Exercise per Week: Not on file  . Minutes of Exercise per Session: Not on file  Stress:   . Feeling of Stress : Not on file  Social Connections:   . Frequency of Communication with Friends and Family: Not on file  . Frequency of Social Gatherings with Friends and Family: Not on file  . Attends Religious Services: Not on file  . Active Member of Clubs or Organizations: Not on file  . Attends Archivist Meetings: Not on file  . Marital Status: Not on file  Intimate Partner Violence:   . Fear of Current or Ex-Partner: Not on file  . Emotionally Abused: Not on file  . Physically Abused: Not on file  . Sexually Abused: Not on file    Review of Systems: See HPI, otherwise negative ROS  Physical Exam: BP  (!) 154/99   Pulse 80   Temp 97.9 F (36.6 C)   Resp 20   Ht 5\' 3"  (1.6 m)   Wt 100.7 kg   LMP 06/20/2017   SpO2 99%   BMI 39.33 kg/m  General:   Alert,  pleasant and cooperative in NAD Head:  Normocephalic and atraumatic. Neck:  Supple; no masses or thyromegaly. Lungs:  Clear throughout to auscultation, normal respiratory effort.    Heart:  +S1, +S2, Regular rate and rhythm, No edema. Abdomen:  Soft, nontender and nondistended. Normal bowel sounds, without guarding, and without rebound.   Neurologic:  Alert and  oriented x4;  grossly normal neurologically.  Impression/Plan: Meghan Vasquez is here for a sigmoidoscopy to evaluate recent  Rectal NET resected site  Risks, benefits, limitations, and alternatives regarding  colonoscopy have been reviewed with the patient.  Questions have been answered.  All parties agreeable.   Jonathon Bellows, MD  06/24/2019, 9:55 AM

## 2019-06-24 NOTE — Op Note (Signed)
[facility] Gastroenterology Patient Name: Meghan Vasquez Procedure Date: 06/24/2019 9:55 AM MRN: [id number] Account #: [id number] Date of Birth: Apr 05, 1961 Admit Type: Outpatient Age: 59 Room: [facility] ENDO ROOM 4 Gender: Female Note Status: Finalized Procedure:             Flexible Sigmoidoscopy Indications:           High risk colon cancer surveillance: Personal history                         of colonic polyps Providers:             Jonathon Bellows MD, MD Referring MD:          Perrin Smack (Referring MD) Medicines:             None Complications:         No immediate complications. Procedure:             Pre-Anesthesia Assessment:                        - Prior to the procedure, a History and Physical was                         performed, and patient medications, allergies and                         sensitivities were reviewed. The patient's tolerance                         of previous anesthesia was reviewed.                        - The risks and benefits of the procedure and the                         sedation options and risks were discussed with the                         patient. All questions were answered and informed                         consent was obtained.                        - After reviewing the risks and benefits, the patient                         was deemed in satisfactory condition to undergo the                         procedure.                        - ASA Grade Assessment: II - A patient with mild                         systemic disease.                        After obtaining informed consent, the scope was passed  under direct vision. The Endoscope was introduced                         through the anus and advanced to the the sigmoid                         colon. The flexible sigmoidoscopy was accomplished                         with ease. The patient tolerated the procedure well.         The quality of the bowel preparation was excellent. Findings:      The perianal and digital rectal examinations were normal.      A single (solitary) five mm ulcer was found in the rectum. No bleeding       was present. No stigmata of recent bleeding were seen. Area was tattooed       with an injection of Spot (carbon black). Impression:            - A single (solitary) ulcer in the rectum. Tattooed.                        - No specimens collected. Recommendation:        - Discharge patient to home (with escort).                        - Advance diet as tolerated.                        - Await pathology results.                        - Repeat flexible sigmoidoscopy in 4 months for                         surveillance. Procedure Code(s):     --- Professional ---                        [phone], Sigmoidoscopy, flexible; with directed                         submucosal injection(s), any substance Diagnosis Code(s):     --- Professional ---                        Z86.010, Personal history of colonic polyps                        K62.6, Ulcer of anus and rectum CPT copyright 2019 American Medical Association. All rights reserved. The codes documented in this report are preliminary and upon coder review may  be revised to meet current compliance requirements. Jonathon Bellows, MD Jonathon Bellows MD, MD 06/24/2019 10:26:37 AM This report has been signed electronically. Number of Addenda: 0 Note Initiated On: 06/24/2019 9:55 AM Estimated Blood Loss:  Estimated blood loss: none.      [facility]

## 2019-06-24 NOTE — Progress Notes (Signed)
FYI: I went back today and took a lot of biopsies at the prior polypectomy site to ensure there was no residual tumor left.  I also have tattooed the area.  Dr Jonathon Bellows MD,MRCP [facility]) Gastroenterology/Hepatology Pager: [phone]

## 2019-06-25 ENCOUNTER — Other Ambulatory Visit: Payer: Medicare HMO

## 2019-06-25 ENCOUNTER — Encounter: Payer: Self-pay | Admitting: *Deleted

## 2019-06-25 LAB — SURGICAL PATHOLOGY

## 2019-06-25 NOTE — Progress Notes (Signed)
Tumor Board Documentation  Donyae Rohrbaugh was presented by Dr Tasia Catchings at our Tumor Board on 06/25/2019, which included representatives from medical oncology, radiation oncology, pathology, navigation, surgical, radiology, surgical oncology, research, palliative care.  [place] currently presents as an external consult, for [place], for new positive pathology with history of the following treatments: active survellience, surgical intervention(s).  Additionally, we reviewed previous medical and familial history, history of present illness, and recent lab results along with all available histopathologic and imaging studies. The tumor board considered available treatment options and made the following recommendations:   Repeat Endoscopy in 6 months  The following procedures/referrals were also placed: No orders of the defined types were placed in this encounter.   Clinical Trial Status: not discussed   Staging used: Pathologic Stage  AJCC Staging:       Group: Grade 1 Neuroendocrine Tumor   National site-specific guidelines   were discussed with respect to the case.  Tumor board is a meeting of clinicians from various specialty areas who evaluate and discuss patients for whom a multidisciplinary approach is being considered. Final determinations in the plan of care are those of the provider(s). The responsibility for follow up of recommendations given during tumor board is that of the provider.   Today's extended care, comprehensive team conference, [place] was not present for the discussion and was not examined.   Multidisciplinary Tumor Board is a multidisciplinary case peer review process.  Decisions discussed in the Multidisciplinary Tumor Board reflect the opinions of the specialists present at the conference without having examined the patient.  Ultimately, treatment and diagnostic decisions rest with the primary provider(s) and the patient.

## 2019-06-28 ENCOUNTER — Encounter: Payer: Self-pay | Admitting: Gastroenterology

## 2019-06-29 ENCOUNTER — Telehealth: Payer: Self-pay

## 2019-06-29 NOTE — Telephone Encounter (Signed)
Spoke with pt and informed her of biopsy results and Dr. Anna's recommendations. Pt understands and agrees. 

## 2019-06-29 NOTE — Telephone Encounter (Signed)
-----   Message from Jonathon Bellows, MD sent at 06/28/2019  3:28 PM EDT ----- Sherald Hess inform - bx taken on recent sigmoidoscopy shows no residual tumor- all biopsies were benign - repeat sigmoidoscopy in 5-6 months - please schedule.   Fyi Dr Tasia Catchings  Dr Jonathon Bellows MD,MRCP [facility]) Gastroenterology/Hepatology Pager: [phone]

## 2019-07-16 IMAGING — CT CT HEAD W/O CM
3 of 4 series · 15 of 47 positions shown, 18 images · non-contrast
Comparison: 08/09/2017

CLINICAL DATA: Dizziness.

EXAM:
CT HEAD WITHOUT CONTRAST
TECHNIQUE: Contiguous axial images were obtained from the base of the skull
through the vertex without intravenous contrast.

[Series 2: head wo · axial · 0.40mm/px · z∈[-144,-24]mm · 9 of 28 slices shown, 12 images]
[im 2/28  brain]
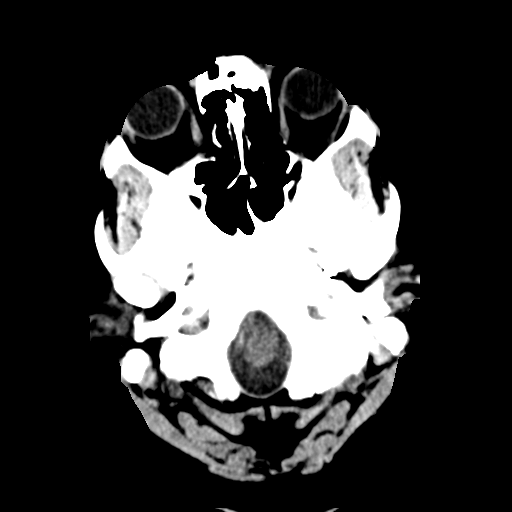
[im 2/28  bone]
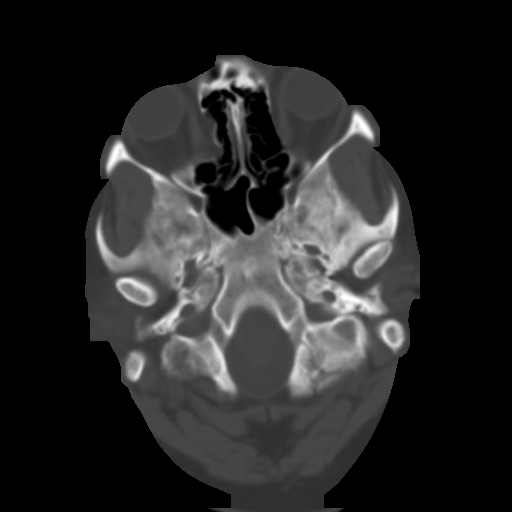
[im 6/28  brain]
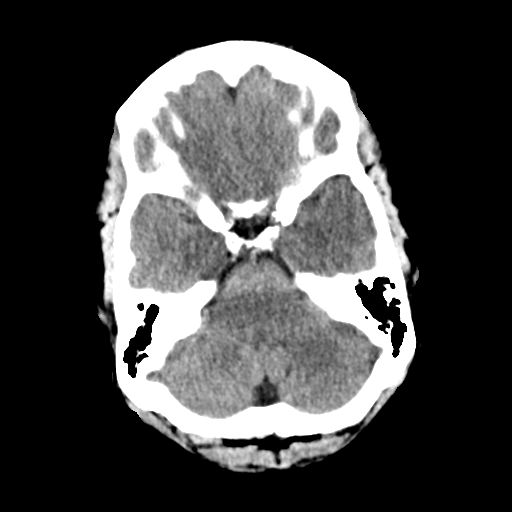
[im 8/28  brain]
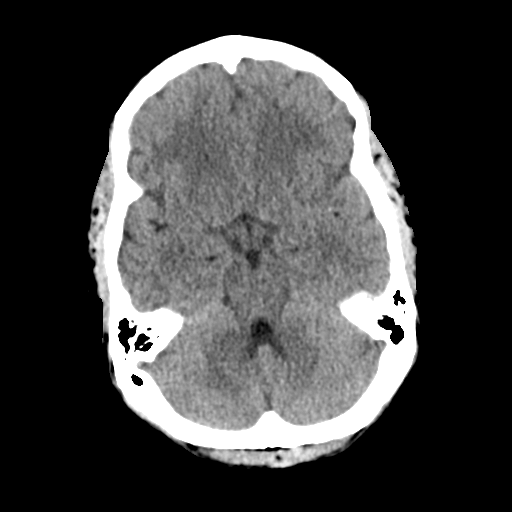
[im 12/28  brain]
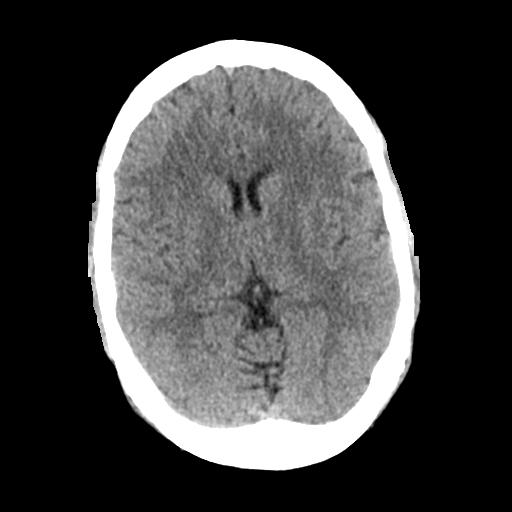
[im 14/28  brain]
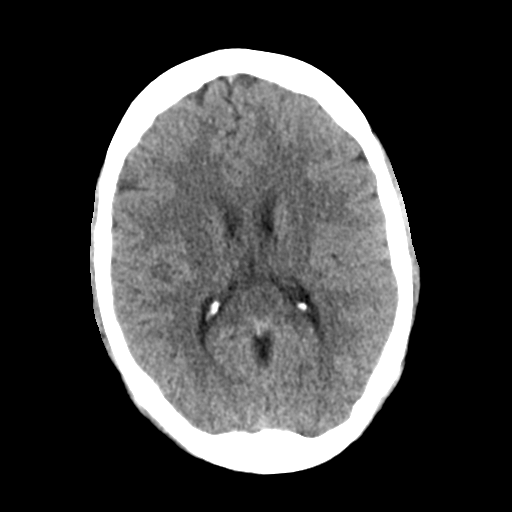
[im 14/28  bone]
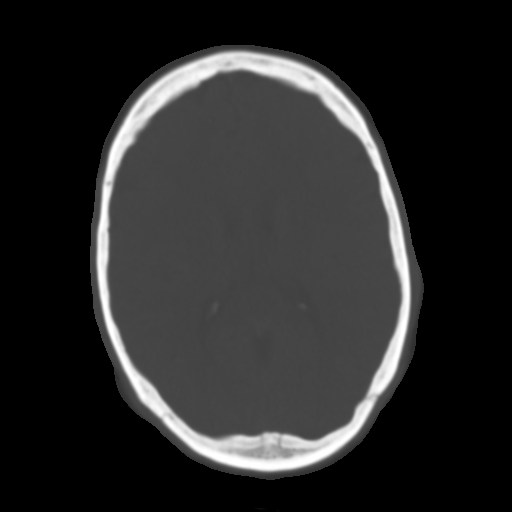
[im 16/28  brain]
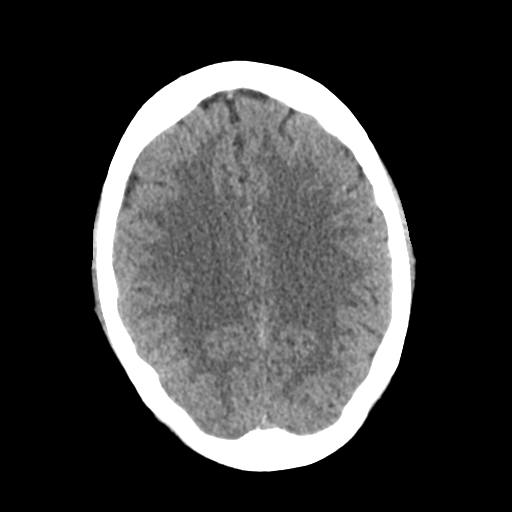
[im 20/28  brain]
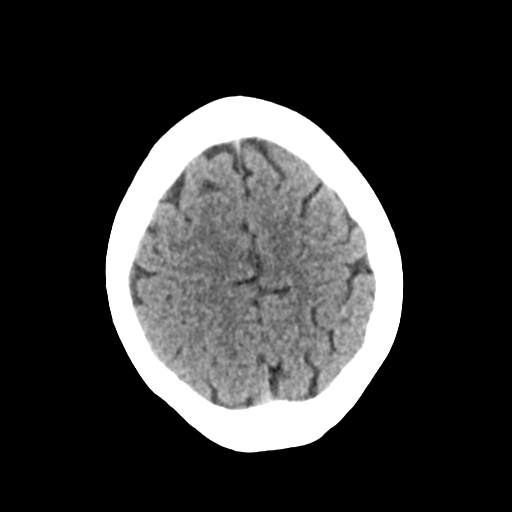
[im 22/28  brain]
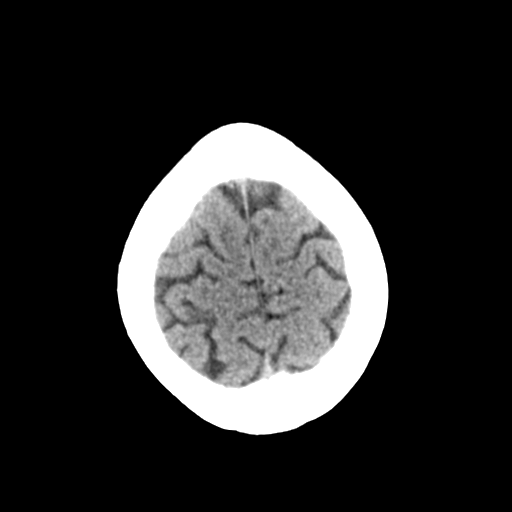
[im 26/28  brain]
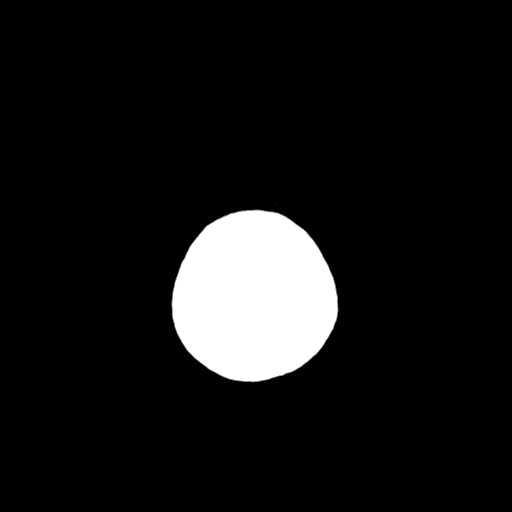
[im 26/28  bone]
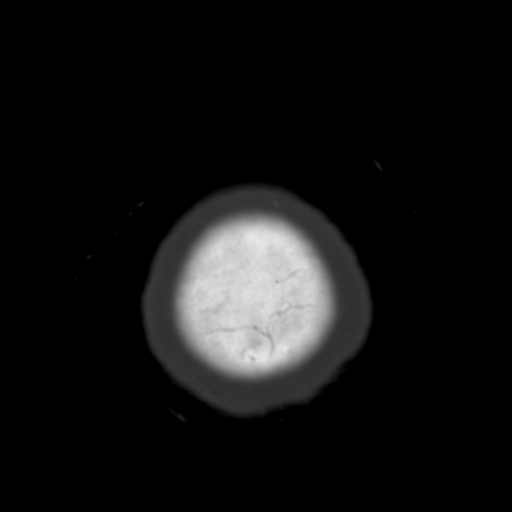

[Series 5: coronal soft tissue · coronal · 0.28mm/px · 3 of 67 slices shown]
[im 23/67  brain]
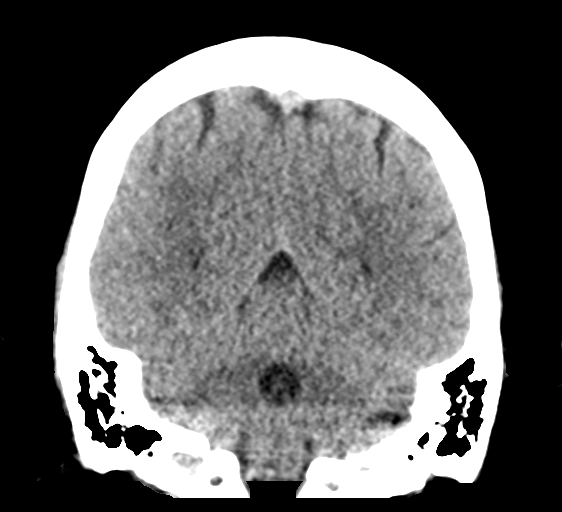
[im 30/67  brain]
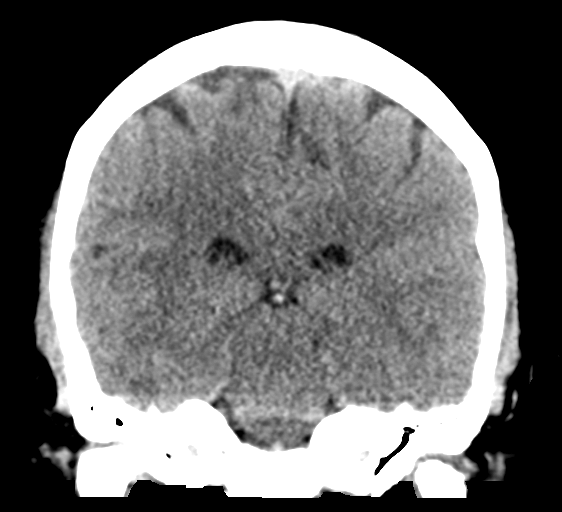
[im 37/67  brain]
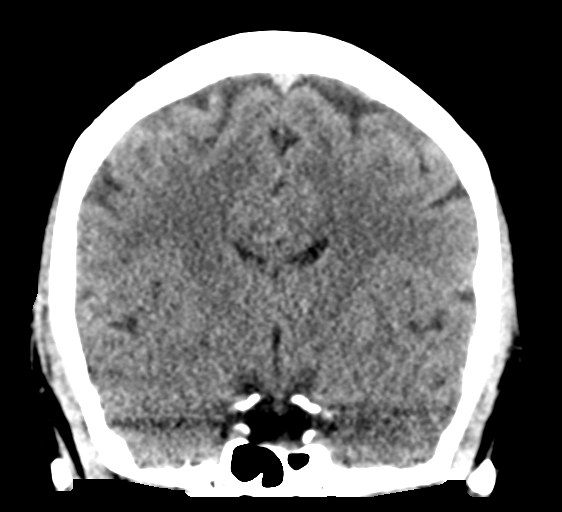

[Series 6: sagittal soft tissue · sagittal · 0.28mm/px · 3 of 53 slices shown]
[im 18/53  brain]
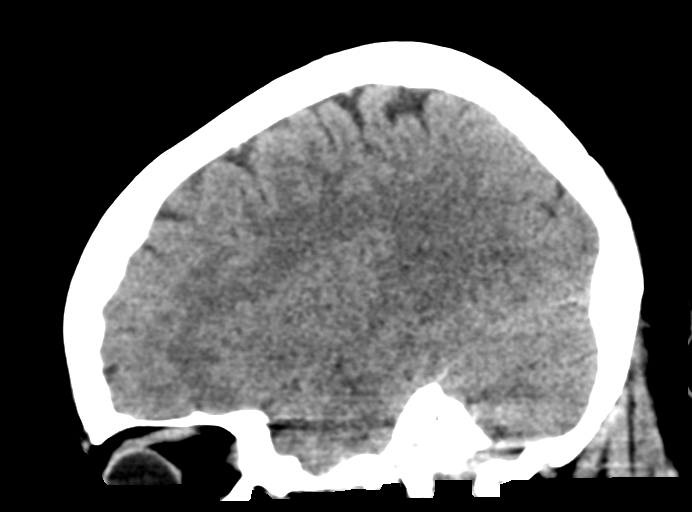
[im 27/53  brain]
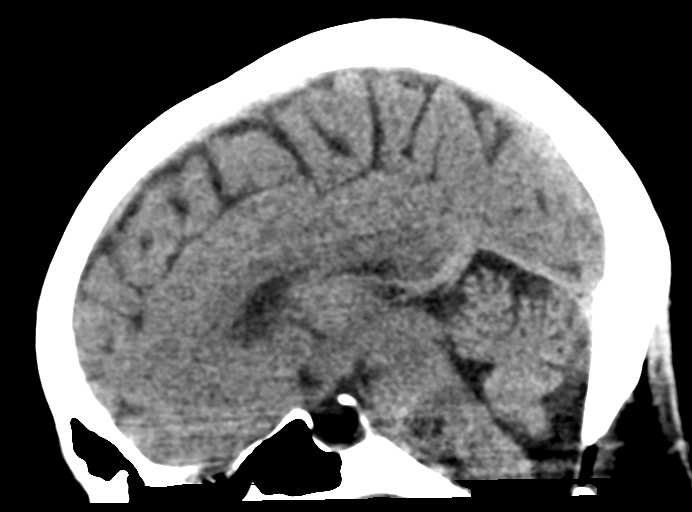
[im 35/53  brain]
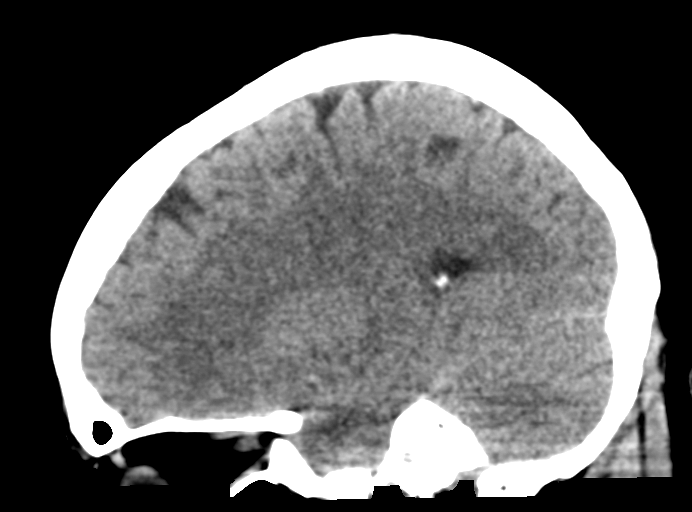

[15 of 47 positions shown; findings below may reference images not displayed]

FINDINGS: Brain: No evidence of acute infarction, hemorrhage, hydrocephalus,
extra-axial collection or mass lesion/mass effect.

Vascular: No hyperdense vessel or unexpected calcification.

Skull: Normal. Negative for fracture or focal lesion.

Sinuses/Orbits: No acute finding. Stable deformity of the medial
wall of the right orbit.

Other: None.
IMPRESSION: No acute intracranial abnormality.

## 2019-09-01 ENCOUNTER — Ambulatory Visit
Admission: RE | Admit: 2019-09-01 | Discharge: 2019-09-01 | Disposition: A | Discharge status: Home / Self Care | Payer: Medicare HMO | Source: Ambulatory Visit | Attending: Family Medicine | Admitting: Family Medicine

## 2019-09-01 DIAGNOSIS — Z1231 Encounter for screening mammogram for malignant neoplasm of breast: Secondary | ICD-10-CM | POA: Diagnosis present

## 2019-09-08 ENCOUNTER — Inpatient Hospital Stay
Admission: RE | Admit: 2019-09-08 | Discharge: 2019-09-08 | Disposition: A | Discharge status: Home / Self Care | Payer: Self-pay | Source: Ambulatory Visit | Attending: *Deleted | Admitting: *Deleted

## 2019-09-08 ENCOUNTER — Other Ambulatory Visit: Payer: Self-pay | Admitting: *Deleted

## 2019-09-08 DIAGNOSIS — Z1231 Encounter for screening mammogram for malignant neoplasm of breast: Secondary | ICD-10-CM

## 2019-11-29 ENCOUNTER — Encounter: Payer: Self-pay | Admitting: Emergency Medicine

## 2019-11-29 ENCOUNTER — Emergency Department: Payer: Medicare HMO

## 2019-11-29 ENCOUNTER — Other Ambulatory Visit: Payer: Self-pay

## 2019-11-29 DIAGNOSIS — R079 Chest pain, unspecified: Secondary | ICD-10-CM | POA: Diagnosis present

## 2019-11-29 DIAGNOSIS — Z7984 Long term (current) use of oral hypoglycemic drugs: Secondary | ICD-10-CM | POA: Diagnosis not present

## 2019-11-29 DIAGNOSIS — E119 Type 2 diabetes mellitus without complications: Secondary | ICD-10-CM | POA: Diagnosis not present

## 2019-11-29 DIAGNOSIS — R0789 Other chest pain: Secondary | ICD-10-CM | POA: Diagnosis not present

## 2019-11-29 LAB — BASIC METABOLIC PANEL
Anion gap: 11 (ref 5–15)
BUN: 23 mg/dL — ABNORMAL HIGH (ref 6–20)
CO2: 25 mmol/L (ref 22–32)
Calcium: 9.1 mg/dL (ref 8.9–10.3)
Chloride: 98 mmol/L (ref 98–111)
Creatinine, Ser: 1.21 mg/dL — ABNORMAL HIGH (ref 0.44–1.00)
GFR calc Af Amer: 57 mL/min — ABNORMAL LOW (ref >60)
GFR calc non Af Amer: 49 mL/min — ABNORMAL LOW (ref >60)
Glucose, Bld: 193 mg/dL — ABNORMAL HIGH (ref 70–99)
Potassium: 4 mmol/L (ref 3.5–5.1)
Sodium: 134 mmol/L — ABNORMAL LOW (ref 135–145)

## 2019-11-29 LAB — CBC
HCT: 31.6 % — ABNORMAL LOW (ref 36.0–46.0)
Hemoglobin: 10.2 g/dL — ABNORMAL LOW (ref 12.0–15.0)
MCH: 29.3 pg (ref 26.0–34.0)
MCHC: 32.3 g/dL (ref 30.0–36.0)
MCV: 90.8 fL (ref 80.0–100.0)
Platelets: 269 10*3/uL (ref 150–400)
RBC: 3.48 MIL/uL — ABNORMAL LOW (ref 3.87–5.11)
RDW: 13.6 % (ref 11.5–15.5)
WBC: 12.7 10*3/uL — ABNORMAL HIGH (ref 4.0–10.5)
nRBC: 0 % (ref 0.0–0.2)

## 2019-11-29 LAB — POCT PREGNANCY, URINE: Preg Test, Ur: NEGATIVE

## 2019-11-29 LAB — TROPONIN I (HIGH SENSITIVITY): Troponin I (High Sensitivity): <2 ng/L (ref <18)

## 2019-11-29 NOTE — ED Triage Notes (Signed)
Pt here via [facility] from home with reports of chest pain. Per EMS they were called 2x today, pt c/o L side chest pain states she thought it was gas, pt also has pain with palpation per EMS.  CP since Thursday  Received 324 ASA with EMS.

## 2019-11-30 ENCOUNTER — Emergency Department
Admission: EM | Admit: 2019-11-30 | Discharge: 2019-11-30 | Disposition: A | Discharge status: Home / Self Care | Payer: Medicare HMO | Attending: Emergency Medicine | Admitting: Emergency Medicine

## 2019-11-30 ENCOUNTER — Emergency Department: Payer: Medicare HMO

## 2019-11-30 DIAGNOSIS — R0789 Other chest pain: Secondary | ICD-10-CM

## 2019-11-30 LAB — TROPONIN I (HIGH SENSITIVITY): Troponin I (High Sensitivity): <2 ng/L (ref <18)

## 2019-11-30 MED ORDER — IOHEXOL 350 MG/ML SOLN
75.0000 mL | Freq: Once | INTRAVENOUS | Status: AC | PRN
  Administered 2019-11-30: 75 mL via INTRAVENOUS
  Filled 2019-11-30: qty 75

## 2019-11-30 NOTE — ED Notes (Signed)
See triage note  Presents with left side chest pain  States pain increases with movement  No fever  Was seen in [place] with cold sxs'   States this pain started on Thursday  Low grade temp on arrival

## 2019-11-30 NOTE — ED Provider Notes (Signed)
[facility] Emergency Department Provider Note   ____________________________________________    I have reviewed the triage vital signs and the nursing notes.   HISTORY  Chief Complaint Chest Pain     HPI Meghan Vasquez is a 59 y.o. female with history of diabetes who presents with complaints of burning chest discomfort which has been intermittent over the last 3 to 4 days.  She describes a discomfort in her epigastrium which radiates up into her chest.  Sometimes she feels it in her back as well.  She denies shortness of breath.  No fevers or chills or cough.  No nausea or vomiting.  Currently is feeling better.  Does not take anything for this.  No history of heart disease.  Past Medical History:  Diagnosis Date  . Chronic sciatica   . Diabetes mellitus without complication ([place])   . Lumbar radiculopathy   . Sciatica    Workers Comp    Patient Active Problem List   Diagnosis Date Noted  . Spondylosis without myelopathy or radiculopathy, lumbar region 06/03/2017  . Lumbar facet hypertrophy (Multilevel) (Bilateral) 05/23/2017  . Lumbar facet syndrome (Bilateral) (R>L) 04/29/2017  . Osteoarthritis of knee (Left) 04/29/2017  . Tricompartment osteoarthritis of knee (Left) 04/29/2017  . DDD (degenerative disc disease), lumbar 04/29/2017  . Lumbar facet osteoarthritis 04/29/2017  . Osteoarthritis of  Lumbar spine 04/29/2017  . Chronic lumbar radicular pain (R) (L5) 04/29/2017  . Chronic low back pain (Primary Area of Pain) (Bilateral) (R>L) 04/17/2017  . Chronic lower extremity pain (Secondary Area of Pain) (Bilateral) (R>L) 04/17/2017  . Chronic knee pain [facility]) (Left) 04/17/2017  . Chronic pain syndrome 04/17/2017  . Disorder of bone, unspecified 04/17/2017  . Other specified health status 04/17/2017  . Other long term (current) drug therapy 04/17/2017  . Skin cyst 09/05/2015  . Breast cyst 09/05/2015  . Anemia 02/01/2015  .  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 Perimenopause 01/31/2015  . 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 COLONOSCOPY  2013  . COLONOSCOPY WITH PROPOFOL N/A 06/18/2019   Procedure: COLONOSCOPY WITH PROPOFOL;  Surgeon: Jonathon Bellows, MD;  Location: [facility] ENDOSCOPY;  Service: Gastroenterology;  Laterality: N/A;  . etopic pregnancy    . FLEXIBLE SIGMOIDOSCOPY N/A 06/24/2019   Procedure: FLEXIBLE SIGMOIDOSCOPY;  Surgeon: Jonathon Bellows, MD;  Location: [facility] ENDOSCOPY;  Service: Gastroenterology;  Laterality: N/A;  NON-SEDATED    Prior to Admission medications   Medication Sig Start Date End Date Taking? Authorizing Provider  cyclobenzaprine (FLEXERIL) 10 MG tablet Take by mouth. 09/20/14   [provider]  ibuprofen (ADVIL,MOTRIN) 200 MG tablet Take 200 mg by mouth every 6 (six) hours as needed.    [provider]  meclizine (ANTIVERT) 25 MG tablet Take 1 tablet (25 mg total) by mouth 3 (three) times daily as needed for dizziness. 09/09/17   Darel Hong, MD  meclizine (ANTIVERT) 25 MG tablet Take 1 tablet (25 mg total) by mouth 3 (three) times daily as needed for dizziness. 03/12/19   Nena Polio, MD  meloxicam (MOBIC) 15 MG tablet Take by mouth. 09/20/14   [provider]  metFORMIN (GLUCOPHAGE) 500 MG tablet 1 BY MOUTH ONCE DAILY FOR DIABETES 09/06/17   [provider]  tiZANidine (ZANAFLEX) 4 MG tablet Take by mouth. 11/23/14   [provider]  traMADol (ULTRAM) 50 MG tablet Take 1 tablet (50 mg  total) by mouth every 6 (six) hours as needed for severe pain. 05/13/17 09/19/17  Milinda Pointer, MD     Allergies Patient has no known allergies.  Family History  Problem Relation Age of Onset  . Diabetes Mother   . Emphysema Brother   . Other Daughter        Scarcoidosis  . Aplastic anemia Daughter   . Kidney disease Daughter   . Cancer Daughter        Breast  Bilateral Mastectomy age 29, kidney   . Breast cancer Daughter     Social History Social History   Tobacco Use  . Smoking status: Never Smoker  . Smokeless tobacco: Never Used  Vaping Use  . Vaping Use: Never used  Substance Use Topics  . Alcohol use: No  . Drug use: No    Review of Systems  Constitutional: No fever/chills Eyes: No visual changes.  ENT: No sore throat. Cardiovascular: As above Respiratory: Denies shortness of breath. Gastrointestinal: No abdominal pain.   Genitourinary: Negative for dysuria. Musculoskeletal: Negative for back pain. Skin: Negative for rash. Neurological: Negative for headaches   ____________________________________________   PHYSICAL EXAM:  VITAL SIGNS: ED Triage Vitals  Enc Vitals Group     BP 11/29/19 2053 (!) 145/78     Pulse Rate 11/29/19 2053 (!) 101     Resp 11/29/19 2053 18     Temp 11/29/19 2053 99.5 F (37.5 C)     Temp Source 11/29/19 2053 Oral     SpO2 11/29/19 2053 96 %     Weight 11/29/19 2058 95.3 kg (210 lb)     Height 11/29/19 2058 1.6 m (5\' 3" )     Head Circumference --      Peak Flow --      Pain Score 11/29/19 2058 6     Pain Loc --      Pain Edu? --      Excl. in [place]? --     Constitutional: Alert and oriented. No acute distress.  Nose: No congestion/rhinnorhea. Mouth/Throat: Mucous membranes are moist.   Neck:  Painless ROM Cardiovascular: Normal rate, regular rhythm. Grossly normal heart sounds.  Good peripheral circulation. Respiratory: Normal respiratory effort.  No retractions. Lungs CTAB. Gastrointestinal: Soft and nontender. No distention.  Musculoskeletal:   Warm and well perfused Neurologic:  Normal speech and language. No gross focal neurologic deficits are appreciated.  Skin:  Skin is warm, dry and intact. No rash noted. Psychiatric: Mood and affect are normal. Speech and behavior are normal.  ____________________________________________   LABS (all labs ordered are listed, but only  abnormal results are displayed)  Labs Reviewed  BASIC METABOLIC PANEL - Abnormal; Notable for the following components:      Result Value   Sodium 134 (*)    Glucose, Bld 193 (*)    BUN 23 (*)    Creatinine, Ser 1.21 (*)    GFR calc non Af Amer 49 (*)    GFR calc Af Amer 57 (*)    All other components within normal limits  CBC - Abnormal; Notable for the following components:   WBC 12.7 (*)    RBC 3.48 (*)    Hemoglobin 10.2 (*)    HCT 31.6 (*)    All other components within normal limits  POC URINE PREG, ED  POCT PREGNANCY, URINE  TROPONIN I (HIGH SENSITIVITY)  TROPONIN I (HIGH SENSITIVITY)   ____________________________________________  EKG  ED ECG REPORT I, Lavonia Drafts, the attending physician,  personally viewed and interpreted this ECG.  Date: 11/30/2019  Rhythm: normal sinus rhythm QRS Axis: normal Intervals: normal ST/T Wave abnormalities: normal Narrative Interpretation: no evidence of acute ischemia  ____________________________________________  RADIOLOGY  Chest x-ray reviewed by me, no infiltrate effusion or pneumothorax CT angiography negative for aneurysm or dissection ____________________________________________   PROCEDURES  Procedure(s) performed: No  Procedures   Critical Care performed: No ____________________________________________   INITIAL IMPRESSION / ASSESSMENT AND PLAN / ED COURSE  Pertinent labs & imaging results that were available during my care of the patient were reviewed by me and considered in my medical decision making (see chart for details).  Patient presents with chest discomfort as described above.  Description of burning discomfort suspicious for GERD, esophagitis.  Differential also includes ACS, angina, dissection given description of chest and back pain  EKG is reassuring making ACS less likely, troponin is undetectable  CT angiography is negative for dissection or aneurysm  Patient is feeling well, vitals  are stable, I will have her follow-up closely with cardiology for further work-up, strict return cautions discussed, patient agrees with plan    ____________________________________________   FINAL CLINICAL IMPRESSION(S) / ED DIAGNOSES  Final diagnoses:  Atypical chest pain        Note:  This document was prepared using Dragon voice recognition software and may include unintentional dictation errors.   Lavonia Drafts, MD 11/30/19 1323

## 2020-11-15 ENCOUNTER — Other Ambulatory Visit: Payer: Self-pay | Admitting: Family Medicine

## 2020-11-15 DIAGNOSIS — Z1231 Encounter for screening mammogram for malignant neoplasm of breast: Secondary | ICD-10-CM

## 2020-12-13 ENCOUNTER — Ambulatory Visit
Admission: RE | Admit: 2020-12-13 | Discharge: 2020-12-13 | Disposition: A | Discharge status: Home / Self Care | Payer: Medicare HMO | Source: Ambulatory Visit | Attending: Family Medicine | Admitting: Family Medicine

## 2020-12-13 ENCOUNTER — Other Ambulatory Visit: Payer: Self-pay

## 2020-12-13 DIAGNOSIS — Z1231 Encounter for screening mammogram for malignant neoplasm of breast: Secondary | ICD-10-CM | POA: Insufficient documentation

## 2022-02-07 ENCOUNTER — Other Ambulatory Visit: Payer: Self-pay | Admitting: Family Medicine

## 2022-02-07 DIAGNOSIS — Z1231 Encounter for screening mammogram for malignant neoplasm of breast: Secondary | ICD-10-CM

## 2022-02-15 ENCOUNTER — Telehealth: Payer: Self-pay | Admitting: Orthopedic Surgery

## 2022-02-15 DIAGNOSIS — M545 Low back pain, unspecified: Secondary | ICD-10-CM

## 2022-02-15 NOTE — Telephone Encounter (Signed)
Patient confirmed

## 2022-02-15 NOTE — Telephone Encounter (Signed)
She has appt tomorrow and needs lumbar xrays prior to seeing me. They are ordered. Please let her know to come early. Thanks!

## 2022-02-15 NOTE — Progress Notes (Signed)
Referring Physician:  Donnie Coffin, MD [place],  [place] [address]  Primary Physician:  Donnie Coffin, MD  History of Present Illness: 02/16/2022 [name] is here today with a chief complaint of chronic constant LBP with bilateral posterior legs (right worse than left) to her feet since 2016. LBP = leg pain. She notes spasms in her legs with numbness/tingling in her toes. Pain is worse with prolonged standing and walking. She uses a cane.   No bowel or bladder issues. Some  relief with current HHPT (working on her hands and back).   Has been seen for her back under WC previously, she was injured at work in [place]- is now on her private insurance.   Seen by Dr. Luciana Axe at [facility] on 06/15/21 for her lumbar spine. She was sent to PT.    Conservative measures:  Physical therapy: has participated in 2 visits in 2019 at [facility]; currently participating in [place] therapy including regular antiinflammatories: flexeril, neurontin, motrin, mobic, ultram  Injections:  has received epidural steroid injections with Dr Dossie Arbour (last injection was in 2019, only lasted about 1 day)  Past Surgery: denies any spinal surgery.   Gerlene Glassburn has no symptoms of cervical myelopathy.  The symptoms are causing a significant impact on the patient's life.   Review of Systems:  A 10 point review of systems is negative, except for the pertinent positives and negatives detailed in the HPI.  Past Medical History: Past Medical History:  Diagnosis Date   Chronic sciatica    Diabetes mellitus without complication ([place])    Lumbar radiculopathy    Sciatica    Workers Comp    Past Surgical History: Past Surgical History:  Procedure Laterality Date   COLONOSCOPY  2013   COLONOSCOPY WITH PROPOFOL N/A 06/18/2019   Procedure: COLONOSCOPY WITH PROPOFOL;  Surgeon: Jonathon Bellows, MD;  Location: [facility] ENDOSCOPY;  Service: Gastroenterology;  Laterality: N/A;   etopic  pregnancy     FLEXIBLE SIGMOIDOSCOPY N/A 06/24/2019   Procedure: FLEXIBLE SIGMOIDOSCOPY;  Surgeon: Jonathon Bellows, MD;  Location: [facility] ENDOSCOPY;  Service: Gastroenterology;  Laterality: N/A;  NON-SEDATED    Allergies: Allergies as of 02/16/2022   (No Known Allergies)    Medications: Outpatient Encounter Medications as of 02/16/2022  Medication Sig   acetaminophen (TYLENOL) 650 MG CR tablet Take 650-1,300 mg by mouth every 8 (eight) hours as needed for pain.   aspirin EC 81 MG tablet Take 81 mg by mouth daily. Swallow whole.   atorvastatin (LIPITOR) 10 MG tablet Take 10 mg by mouth daily.   cyclobenzaprine (FLEXERIL) 10 MG tablet Take 10 mg by mouth every 8 (eight) hours as needed for muscle spasms.   gabapentin (NEURONTIN) 100 MG capsule Take 100 mg by mouth 3 (three) times daily.   Glucose Blood (GLUCOSE METER TEST VI) as directed.   glucose blood test strip 1 each by Other route in the morning, at noon, and at bedtime.   Lancets 33G MISC 1 Lancet by Does not apply route daily.   meclizine (ANTIVERT) 25 MG tablet Take 12.25 mg by mouth every 6 (six) hours as needed for dizziness.   metFORMIN (GLUCOPHAGE) 500 MG tablet Take 500 mg by mouth daily.   Misc. Devices MISC by Does not apply route. Walk-in tub Massage chair   pantoprazole (PROTONIX) 40 MG tablet Take 40 mg by mouth daily.   traMADol (ULTRAM) 50 MG tablet Take 1 tablet (50 mg total) by  mouth every 6 (six) hours as needed for severe pain. (Patient taking differently: Take 50 mg by mouth 2 (two) times daily as needed for severe pain.)   traZODone (DESYREL) 50 MG tablet Take 50 mg by mouth at bedtime.   [DISCONTINUED] ibuprofen (ADVIL,MOTRIN) 200 MG tablet Take 200 mg by mouth every 6 (six) hours as needed.   [DISCONTINUED] meclizine (ANTIVERT) 25 MG tablet Take 1 tablet (25 mg total) by mouth 3 (three) times daily as needed for dizziness. (Patient taking differently: Take 12.5-25 mg by mouth every 6 (six) hours as needed for  dizziness.)   [DISCONTINUED] meclizine (ANTIVERT) 25 MG tablet Take 1 tablet (25 mg total) by mouth 3 (three) times daily as needed for dizziness.   [DISCONTINUED] meloxicam (MOBIC) 15 MG tablet Take by mouth.   [DISCONTINUED] tiZANidine (ZANAFLEX) 4 MG tablet Take by mouth.   No facility-administered encounter medications on file as of 02/16/2022.    Social History: Social History   Tobacco Use   Smoking status: Never   Smokeless tobacco: Never  Vaping Use   Vaping Use: Never used  Substance Use Topics   Alcohol use: No   Drug use: No    Family Medical History: Family History  Problem Relation Age of Onset   Diabetes Mother    Emphysema Brother    Other Daughter        Scarcoidosis   Aplastic anemia Daughter    Kidney disease Daughter    Cancer Daughter        Breast Bilateral Mastectomy age 30, kidney    Breast cancer Daughter     Physical Examination: Vitals:   02/16/22 0950 02/16/22 1045  BP: (!) 180/94 (!) 161/93  Pulse: 95 94    General: Patient is well developed, well nourished, calm, collected, and in no apparent distress. Attention to examination is appropriate.  Respiratory: Patient is breathing without any difficulty.   NEUROLOGICAL:     Awake, alert, oriented to person, place, and time.  Speech is clear and fluent. Fund of knowledge is appropriate.   Cranial Nerves: Pupils equal round and reactive to light.  Facial tone is symmetric.  Facial sensation is symmetric.  ROM of spine:  Limited ROM of lumbar spine with pain  No abnormal lesions on exposed skin.   No lower lumbar tenderness.   Strength: Side Biceps Triceps Deltoid Interossei Grip Wrist Ext. Wrist Flex.  R '5 5 5 5 5 5 5  '$ L '5 5 5 5 5 5 5   '$ Side Iliopsoas Quads Hamstring PF DF EHL  R '5 5 5 5 5 5  '$ L '5 5 5 5 5 5   '$ Has [facility] braces on both hands.   Reflexes are 2+ and symmetric at the biceps, triceps, brachioradialis, patella and achilles.   Hoffman's is absent.  Clonus is not  present.   Bilateral upper and lower extremity sensation is intact to light touch.     She ambulates with a cane.   Medical Decision Making  Imaging: Lumbar xrays dated 02/16/22:  Slip at L4-L5 with mild DDD L4-S1 and lumbar spondylosis.   Xray report from radiology not available for my review.   Assessment and Plan: Ms. Piekarski is a pleasant 61 y.o. female with chronic constant LBP with bilateral posterior legs (right worse than left) to her feet since 2016 after injury at work. LBP = leg pain. Pain is worse with prolonged standing and walking.   She has known slip at L4-L5 with mild DDD  L4-S1 and lumbar spondylosis. Some relief with HHPT (working on back and hands per patient). No relief with previous ESIs.   Treatment options discussed with patient and following plan made:   - MRI of lumbar spine to further evaluate bilateral lumbar radiculopathy. No improvement with HHPT, injections, medications, or time.  - Continue HHPT for now.  - She is ready to discuss surgery options for her back. Current BMI 38.44. Would need to get down to BMI of 35 (200lbs) prior to any fusion surgery.  - BP was initially 180/94 with no CP, SOB, blurry vision, or headaches. She states BP has been all over the place- she thinks due to pain and stress. Her daughter passed away a few months ago (she has 60 year old twins and a 60 year old).  - Repeat BP was 161/93. Still no symptoms. Advised to call PCP about blood pressure. Will also have HHPT check it as well. If she develops any CP, SOB, blurry vision, or headaches then she will go to ED.  - Will set up phone visit to review her MRI results and then she may need to see Dr. Izora Ribas to discuss surgery options.   I spent a total of 35 minutes in face-to-face and non-face-to-face activities related to this patient's care toda including review of outside records, review of imaging, review of symptoms, physical exam, discussion of differential diagnosis, discussion of  treatment options, and documentation.   Thank you for involving me in the care of this patient.   Geronimo Boot PA-C Dept. of Neurosurgery

## 2022-02-16 ENCOUNTER — Encounter: Payer: Self-pay | Admitting: Orthopedic Surgery

## 2022-02-16 ENCOUNTER — Ambulatory Visit
Admission: RE | Admit: 2022-02-16 | Discharge: 2022-02-16 | Disposition: A | Discharge status: Home / Self Care | Payer: Medicare HMO | Source: Ambulatory Visit | Attending: Orthopedic Surgery | Admitting: Orthopedic Surgery

## 2022-02-16 ENCOUNTER — Ambulatory Visit
Admission: RE | Admit: 2022-02-16 | Discharge: 2022-02-16 | Disposition: A | Discharge status: Home / Self Care | Payer: Medicare HMO | Attending: Orthopedic Surgery | Admitting: Orthopedic Surgery

## 2022-02-16 ENCOUNTER — Ambulatory Visit: Payer: Medicare HMO | Admitting: Orthopedic Surgery

## 2022-02-16 VITALS — BP 161/93 | HR 94 | Ht 63.0 in | Wt 217.0 lb

## 2022-02-16 DIAGNOSIS — M4726 Other spondylosis with radiculopathy, lumbar region: Secondary | ICD-10-CM | POA: Diagnosis not present

## 2022-02-16 DIAGNOSIS — M5136 Other intervertebral disc degeneration, lumbar region: Secondary | ICD-10-CM

## 2022-02-16 DIAGNOSIS — M5416 Radiculopathy, lumbar region: Secondary | ICD-10-CM

## 2022-02-16 DIAGNOSIS — M4316 Spondylolisthesis, lumbar region: Secondary | ICD-10-CM | POA: Diagnosis not present

## 2022-02-16 DIAGNOSIS — M47816 Spondylosis without myelopathy or radiculopathy, lumbar region: Secondary | ICD-10-CM

## 2022-02-16 DIAGNOSIS — M545 Low back pain, unspecified: Secondary | ICD-10-CM

## 2022-02-16 NOTE — Patient Instructions (Signed)
It was so nice to see you today, I am sorry that you are hurting so much.   Your lower back xrays showed wear and tear (arthritis) with a slip at L4-L5. This is likely what is causing your pain.   Continue home health PT for your back since it is helping.   I want to get an MRI of your lower back at [facility] to look into things further. We will call you to set this up.   Will call you to set up phone visit to review your MRI results once I have them back.   Have home health PT check your blood pressure. I recommend calling your PCP to let them know it has been running high. If you have any headaches, blurry vision, shortness of breath, or chest pain, then go to the ED.   If you have not heard back about any of the tests/procedures in the next week, please call the office so we can help you get these things scheduled.   Geronimo Boot PA-C [phone]

## 2022-03-01 ENCOUNTER — Ambulatory Visit
Admission: RE | Admit: 2022-03-01 | Discharge: 2022-03-01 | Disposition: A | Discharge status: Home / Self Care | Payer: Medicare HMO | Source: Ambulatory Visit | Attending: Family Medicine | Admitting: Family Medicine

## 2022-03-01 DIAGNOSIS — Z1231 Encounter for screening mammogram for malignant neoplasm of breast: Secondary | ICD-10-CM | POA: Diagnosis present

## 2022-03-05 ENCOUNTER — Ambulatory Visit
Admission: RE | Admit: 2022-03-05 | Discharge: 2022-03-05 | Disposition: A | Discharge status: Home / Self Care | Payer: Medicare HMO | Source: Ambulatory Visit | Attending: Orthopedic Surgery | Admitting: Orthopedic Surgery

## 2022-03-05 DIAGNOSIS — M47816 Spondylosis without myelopathy or radiculopathy, lumbar region: Secondary | ICD-10-CM | POA: Insufficient documentation

## 2022-03-05 DIAGNOSIS — M5416 Radiculopathy, lumbar region: Secondary | ICD-10-CM | POA: Diagnosis present

## 2022-03-05 DIAGNOSIS — M4316 Spondylolisthesis, lumbar region: Secondary | ICD-10-CM | POA: Insufficient documentation

## 2022-03-05 DIAGNOSIS — M5136 Other intervertebral disc degeneration, lumbar region: Secondary | ICD-10-CM | POA: Insufficient documentation

## 2022-03-12 ENCOUNTER — Encounter: Payer: Self-pay | Admitting: Orthopedic Surgery

## 2022-03-12 ENCOUNTER — Telehealth: Payer: Self-pay

## 2022-03-12 ENCOUNTER — Ambulatory Visit (INDEPENDENT_AMBULATORY_CARE_PROVIDER_SITE_OTHER): Payer: Medicare HMO | Admitting: Orthopedic Surgery

## 2022-03-12 DIAGNOSIS — M4316 Spondylolisthesis, lumbar region: Secondary | ICD-10-CM | POA: Diagnosis not present

## 2022-03-12 DIAGNOSIS — M4726 Other spondylosis with radiculopathy, lumbar region: Secondary | ICD-10-CM

## 2022-03-12 DIAGNOSIS — M47816 Spondylosis without myelopathy or radiculopathy, lumbar region: Secondary | ICD-10-CM

## 2022-03-12 DIAGNOSIS — M5136 Other intervertebral disc degeneration, lumbar region: Secondary | ICD-10-CM

## 2022-03-12 DIAGNOSIS — M5416 Radiculopathy, lumbar region: Secondary | ICD-10-CM

## 2022-03-12 NOTE — Progress Notes (Signed)
Telephone Visit- Progress Note: Referring Physician:  No referring provider defined for this encounter.  Primary Physician:  Donnie Coffin, MD  This visit was performed via telephone.  Patient location: home Provider location: office  I spent a total of 20 minutes non-face-to-face activities for this visit on the date of this encounter including review of current clinical condition and response to treatment.   Chief Complaint:  review lumbar MRI results  History of Present Illness: Meghan Vasquez is a 61 y.o. female who was last seen by me on 02/16/22 for chronic constant LBP with bilateral posterior legs (right worse than left) to her feet since 2016. LBP = leg pain. Pain is worse with prolonged standing and walking.   We dicussed losing weight at her last visit (goal of 200lbs). She thinks she has lost 3 pounds. She has been checking her BP at home and it has been better.  She had flare up of pain on Thanksgiving with severe pain and spasms in her legs. She had constant LBP as well. Pain in legs is better, but they feel weak. No numbness or tingling. She continues on neurontin, flexeril, and prn ultram.    Conservative measures:  Physical therapy: has participated in 2 visits in 2019 at [facility]; currently participating in [place] therapy including regular antiinflammatories: flexeril, neurontin, motrin, mobic, ultram  Injections:  has received epidural steroid injections with Dr Dossie Arbour (last injection was in 2019, only lasted about 1 day)   Past Surgery: denies any spinal surgery.   Exam: No exam done as this was a telephone encounter.     Imaging: MRI lumbar spine dated 03/05/22:  FINDINGS: Segmentation:  5 lumbar type vertebral bodies.   Alignment: Normal except for 1-2 mm of degenerative anterolisthesis at L4-5.   Vertebrae:  No fracture or focal bone lesion.   Conus medullaris and cauda equina: Conus extends to the L1 level. Conus and  cauda equina appear normal.   Paraspinal and other soft tissues: Normal   Disc levels:   No abnormality at T12-L1 or L1-2.   L2-3: Minimal disc bulge. Mild facet degeneration and hypertrophy with small joint effusion on the right. No compressive stenosis. The facet arthritis could be painful, particularly on the right.   L3-4: Mild bulging of the disc. Mild facet and ligamentous hypertrophy. No compressive canal or foraminal narrowing.   L4-5: Desiccation and bulging of the disc more towards the right. Mild right proximal foraminal encroachment could possibly affect the L4 nerve. Facet and ligamentous hypertrophy, with 1-2 mm of degenerative anterolisthesis. Facet arthritis could be painful.   L5-S1: Mild bulging of the disc. Bilateral facet osteoarthritis. No compressive stenosis. The facet arthritis could be painful.   IMPRESSION: 1. L2-3: Facet osteoarthritis right more than left with small joint effusion. No compressive stenosis. The facet arthritis could be painful. 2. L3-4: Mild disc bulge. Mild facet and ligamentous hypertrophy. No compressive stenosis. 3. L4-5: Facet osteoarthritis with 1-2 mm of anterolisthesis. Bulging of the disc more towards the right. Mild proximal foraminal encroachment on the right could possibly affect the right L4 nerve. The facet arthritis could be painful. 4. L5-S1: Disc bulge. Facet osteoarthritis. No compressive stenosis. The facet arthritis could be painful.     Electronically Signed   By: Nelson Chimes M.D.   On: 03/06/2022 11:15  I have personally reviewed the images and agree with the above interpretation.  Assessment and Plan: Ms. Welge is a pleasant 61 y.o. female with chronic  constant LBP with bilateral posterior legs (right worse than left) to her feet. LBP = leg pain. Pain is worse with prolonged standing and walking. She had bad flare up of pain on Thanksgiving.    She has known slip at L4-L5 with mild DDD L4-S1 and lumbar  spondylosis. MRI shows multilevel facet arthropathy along with mild right sided disc bulge L4-L5 that may explain some of right leg pain, but not left leg pain.   Some relief with HHPT (working on back and hands per patient). No relief with previous ESIs.   Treatment options discussed with patient and following plan made:   - Continue with HHPT for her back.  - Continue on flexeril, neurontin, and prn ultram from other providers.  - Recommend EMG bilateral lower extremities to further evaluate bilateral leg pain. She can do at [facility] in [place]. Cannot go to [place].  - Discussed revisiting lumbar injections, she wants to hold off.  - She will continue to work on weight loss with goal of 200lbs.  - Follow up with Dr. Izora Ribas after her EMG. She is interested in surgery options, but I'm not sure she is a surgical candidate.   Geronimo Boot PA-C Neurosurgery

## 2022-03-12 NOTE — Telephone Encounter (Signed)
Geronimo Boot, PA-C  [place], [place] Caller: Unspecified (Today,  9:25 AM) I put in orders for EMG of bilateral lower extremities. Please schedule at [facility] in [place]. She cannot go to [place].  She needs f/u with Izora Ribas once EMG is done. I told her to call us once it's scheduled.  Thanks.

## 2022-03-12 NOTE — Telephone Encounter (Signed)
Referral faxed to [facility].

## 2022-03-14 NOTE — Telephone Encounter (Signed)
Lower EMG is 11/30 at [facility] Upper EMG is [facility] 12/4

## 2022-03-19 NOTE — Telephone Encounter (Signed)
Left message to call the office tomorrow or I will call her back to schedule with Dr. Izora Ribas.

## 2022-03-19 NOTE — Telephone Encounter (Signed)
Recommend she see Dr. Izora Ribas to see if surgery is an option. Please schedule her an appointment.

## 2022-03-19 NOTE — Telephone Encounter (Signed)
She is at [facility] having her upper EMG today. She is in a wheelchair today because her right side/leg hurts that she is not able to walk. This happened over the weekend. Her lower EMG was done on 11/30 and the results are scanned in media. What can you recommend?

## 2022-03-21 NOTE — Telephone Encounter (Signed)
She agreed to see Dr. Izora Ribas on 03/29/2022.

## 2022-03-28 NOTE — Progress Notes (Unsigned)
Referring Physician:  Geronimo Boot, PA-C [address],  [place] [address]  Primary Physician:  Donnie Coffin, MD  History of Present Illness: 03/29/2022 [name] is here today with a chief complaint of low back pain and difficulty with her legs feeling weak.  She has had her legs give out multiple times.  She has trouble walking more than 50 or 100 feet.  She recently was at [facility] and had her legs give out causing her to fall.  She has had 2 different EMGs 1 of which gave concern for an upper motor neuron issue.  She describes numbness throughout her legs bilaterally.  She does not have clearly radicular symptoms, but does have numbness in her hands and discomfort in her forearms.  She is having some dexterity issues.  She is also having balance issues.  She has been walking with a cane for several years.  Prolonged standing and walking make it worse.  Sitting in a massage chair help.  She is currently doing home health PT.  low back pain that radiates into the bilateral legs Bilateral leg weakness - her legs have given out at times Review EMG results, report scanned under media tab   Bowel/Bladder Dysfunction: none  Conservative measures:  Physical therapy: currently participating in [place] [facility] with [facility] Multimodal medical therapy including regular antiinflammatories:  flexeril, gabapentin, motrin, meloxicam, tramadol Injections:  has received epidural steroid injections in 2019 by Dr. Dossie Arbour   Past Surgery: denies  Antonietta Breach has some symptoms of cervical myelopathy.  The symptoms are causing a significant impact on the patient's life.   Progress Note from Geronimo Boot, [place] on 03/12/22:  History of Present Illness: Meghan Vasquez is a 61 y.o. female who was last seen by me on 02/16/22 for chronic constant LBP with bilateral posterior legs (right worse than left) to her feet since 2016. LBP = leg pain. Pain is worse with prolonged standing and  walking.    We dicussed losing weight at her last visit (goal of 200lbs). She thinks she has lost 3 pounds. She has been checking her BP at home and it has been better.   She had flare up of pain on Thanksgiving with severe pain and spasms in her legs. She had constant LBP as well. Pain in legs is better, but they feel weak. No numbness or tingling. She continues on neurontin, flexeril, and prn ultram.    I have utilized the care everywhere function in epic to review the outside records available from external health systems.  Review of Systems:  A 10 point review of systems is negative, except for the pertinent positives and negatives detailed in the HPI.  Past Medical History: Past Medical History:  Diagnosis Date   Chronic sciatica    Diabetes mellitus without complication ([place])    Lumbar radiculopathy    Sciatica    Workers Comp    Past Surgical History: Past Surgical History:  Procedure Laterality Date   COLONOSCOPY  2013   COLONOSCOPY WITH PROPOFOL N/A 06/18/2019   Procedure: COLONOSCOPY WITH PROPOFOL;  Surgeon: Jonathon Bellows, MD;  Location: [facility] ENDOSCOPY;  Service: Gastroenterology;  Laterality: N/A;   etopic pregnancy     FLEXIBLE SIGMOIDOSCOPY N/A 06/24/2019   Procedure: FLEXIBLE SIGMOIDOSCOPY;  Surgeon: Jonathon Bellows, MD;  Location: [facility] ENDOSCOPY;  Service: Gastroenterology;  Laterality: N/A;  NON-SEDATED    Allergies: Allergies as of 03/29/2022   (No Known Allergies)    Medications: See  list   Social History: Social History   Tobacco Use   Smoking status: Never   Smokeless tobacco: Never  Vaping Use   Vaping Use: Never used  Substance Use Topics   Alcohol use: No   Drug use: No    Family Medical History: Family History  Problem Relation Age of Onset   Diabetes Mother    Emphysema Brother    Other Daughter        Scarcoidosis   Aplastic anemia Daughter    Kidney disease Daughter    Cancer Daughter        Breast Bilateral Mastectomy age 7, kidney     Breast cancer Daughter     Physical Examination: Vitals:   03/29/22 0838  BP: (!) 142/83  Pulse: (!) 106    General: Patient is well developed, well nourished, calm, collected, and in no apparent distress. Attention to examination is appropriate.  Neck:   Supple.  Full range of motion.  Respiratory: Patient is breathing without any difficulty.   NEUROLOGICAL:     Awake, alert, oriented to person, place, and time.  Speech is clear and fluent. Fund of knowledge is appropriate.   Cranial Nerves: Pupils equal round and reactive to light.  Facial tone is symmetric.  Facial sensation is symmetric. Shoulder shrug is symmetric. Tongue protrusion is midline.  There is no pronator drift.  ROM of spine: full.    Strength: Side Biceps Triceps Deltoid Interossei Grip Wrist Ext. Wrist Flex.  R '5 5 5 5 '$ 4+ 5 5  L '5 5 5 5 '$ 4+ 5 5   Side Iliopsoas Quads Hamstring PF DF EHL  R '5 5 5 5 5 5  '$ L '5 5 5 5 5 5   '$ Reflexes are 2+ and symmetric at the biceps, triceps, brachioradialis, patella and achilles.   Hoffman's is absent.   Bilateral upper and lower extremity sensation is intact to light touch.    No evidence of dysmetria noted.  Gait is abnormal and unsteady - uses cane.  Cannot do tandem gait   Medical Decision Making  Imaging: MRI 03/05/2022   IMPRESSION: 1. L2-3: Facet osteoarthritis right more than left with small joint effusion. No compressive stenosis. The facet arthritis could be painful. 2. L3-4: Mild disc bulge. Mild facet and ligamentous hypertrophy. No compressive stenosis. 3. L4-5: Facet osteoarthritis with 1-2 mm of anterolisthesis. Bulging of the disc more towards the right. Mild proximal foraminal encroachment on the right could possibly affect the right L4 nerve. The facet arthritis could be painful. 4. L5-S1: Disc bulge. Facet osteoarthritis. No compressive stenosis. The facet arthritis could be painful.     Electronically Signed   By: Nelson Chimes M.D.    On: 03/06/2022 11:15  L spine flexion and extension 02/16/2022  FINDINGS: Five non rib-bearing lumbar type vertebral bodies are present. Grade 1 anterolisthesis at L4-5 is new. The anterolisthesis is slightly worse with flexion and partially reduces in extension. No other significant listhesis is present. Disc spaces are preserved. Soft tissues are unremarkable. Bilateral facet degenerative changes are present at L4-5 and L5-S1.   IMPRESSION: 1. New grade 1 anterolisthesis at L4-5 with evidence for dynamic instability, increasing with flexion and partially reducing in extension. 2. Degenerative facet changes at L4-5 and L5-S1.     Electronically Signed   By: San Morelle M.D.   On: 02/19/2022 13:48   I have personally reviewed the images and agree with the above interpretation.  Assessment and Plan: Ms. Ohmer  is a pleasant 61 y.o. female with imbalance causing falls.  She also has numbness and tingling in her hands and arms.  This gives me some concern regarding thoracic or cervical myelopathy.  She has an anterolisthesis of L4-5 which causes possible right L4 nerve root impingement and is new since 2019, but this cannot explain her symptoms.  She is having symptoms enough to cause significant concern and difficulty for her that may lead to falls on an ongoing basis.  Before making consideration of future treatment, I think it is indicated to get a cervical spine and thoracic spine MRI scan.  I have also recommended that she see an orthopedic surgeon for evaluation for carpal tunnel release.  I have made that referral.  I spent a total of 30 minutes in this patient's care today. This time was spent reviewing pertinent records including imaging studies, obtaining and confirming history, performing a directed evaluation, formulating and discussing my recommendations, and documenting the visit within the medical record.      Thank you for involving me in the care of this  patient.      Annagrace Carr K. Izora Ribas MD, [facility]

## 2022-03-29 ENCOUNTER — Encounter: Payer: Self-pay | Admitting: Neurosurgery

## 2022-03-29 ENCOUNTER — Ambulatory Visit: Payer: Medicare HMO | Admitting: Neurosurgery

## 2022-03-29 VITALS — BP 142/83 | HR 106 | Ht 63.0 in | Wt 218.0 lb

## 2022-03-29 DIAGNOSIS — M4310 Spondylolisthesis, site unspecified: Secondary | ICD-10-CM

## 2022-03-29 DIAGNOSIS — R2689 Other abnormalities of gait and mobility: Secondary | ICD-10-CM | POA: Diagnosis not present

## 2022-03-29 DIAGNOSIS — M545 Low back pain, unspecified: Secondary | ICD-10-CM

## 2022-03-29 DIAGNOSIS — R200 Anesthesia of skin: Secondary | ICD-10-CM

## 2022-03-29 DIAGNOSIS — G5603 Carpal tunnel syndrome, bilateral upper limbs: Secondary | ICD-10-CM

## 2022-03-29 DIAGNOSIS — G8929 Other chronic pain: Secondary | ICD-10-CM

## 2022-04-02 ENCOUNTER — Telehealth: Payer: Self-pay

## 2022-04-02 NOTE — Telephone Encounter (Signed)
Ms Meghan Vasquez called in requesting clarification and a reminder of the outcome of her visit with Dr Izora Ribas on 03/29/22. I discussed that her lower extremity EMG report stated that the study was normal. I reminded her that Dr Izora Ribas ordered an MRI of her cervical and thoracic spine to evaluate her imbalance and falls and I provided her with the phone # to schedule these.

## 2022-04-12 ENCOUNTER — Ambulatory Visit
Admission: RE | Admit: 2022-04-12 | Discharge: 2022-04-12 | Disposition: A | Discharge status: Home / Self Care | Payer: Medicare HMO | Source: Ambulatory Visit | Attending: Neurosurgery | Admitting: Neurosurgery

## 2022-04-12 DIAGNOSIS — R200 Anesthesia of skin: Secondary | ICD-10-CM | POA: Diagnosis present

## 2022-04-12 DIAGNOSIS — R2689 Other abnormalities of gait and mobility: Secondary | ICD-10-CM | POA: Diagnosis present

## 2022-04-20 ENCOUNTER — Ambulatory Visit (INDEPENDENT_AMBULATORY_CARE_PROVIDER_SITE_OTHER): Payer: Medicare HMO | Admitting: Neurosurgery

## 2022-04-20 DIAGNOSIS — R2689 Other abnormalities of gait and mobility: Secondary | ICD-10-CM

## 2022-04-20 NOTE — Progress Notes (Signed)
Referring Physician:  Donnie Coffin, MD [address] [place],  [place] [address]  Primary Physician:  Donnie Coffin, MD  History of Present Illness: 04/20/2022 Telephone call today to discuss MRI findings.  04/02/2022 Meghan Vasquez is here today with a chief complaint of low back pain and difficulty with her legs feeling weak.  She has had her legs give out multiple times.  She has trouble walking more than 50 or 100 feet.  She recently was at [facility] and had her legs give out causing her to fall.  She has had 2 different EMGs 1 of which gave concern for an upper motor neuron issue.  She describes numbness throughout her legs bilaterally.  She does not have clearly radicular symptoms, but does have numbness in her hands and discomfort in her forearms.  She is having some dexterity issues.  She is also having balance issues.  She has been walking with a cane for several years.  Prolonged standing and walking make it worse.  Sitting in a massage chair help.  She is currently doing home health PT.  low back pain that radiates into the bilateral legs Bilateral leg weakness - her legs have given out at times Review EMG results, report scanned under media tab   Bowel/Bladder Dysfunction: none  Conservative measures:  Physical therapy: currently participating in [place] PT with [facility] Multimodal medical therapy including regular antiinflammatories:  flexeril, gabapentin, motrin, meloxicam, tramadol Injections:  has received epidural steroid injections in 2019 by Dr. Dossie Arbour   Past Surgery: denies  Meghan Vasquez has some symptoms of cervical myelopathy.  The symptoms are causing a significant impact on the patient's life.   Progress Note from Geronimo Boot, [place] on 03/12/22:  History of Present Illness: Meghan Vasquez is a 62 y.o. female who was last seen by me on 02/16/22 for chronic constant LBP with bilateral posterior legs (right worse than left) to her feet since 2016. LBP  = leg pain. Pain is worse with prolonged standing and walking.    We dicussed losing weight at her last visit (goal of 200lbs). She thinks she has lost 3 pounds. She has been checking her BP at home and it has been better.   She had flare up of pain on Thanksgiving with severe pain and spasms in her legs. She had constant LBP as well. Pain in legs is better, but they feel weak. No numbness or tingling. She continues on neurontin, flexeril, and prn ultram.    I have utilized the care everywhere function in epic to review the outside records available from external health systems.  Review of Systems:  A 10 point review of systems is negative, except for the pertinent positives and negatives detailed in the HPI.  Past Medical History: Past Medical History:  Diagnosis Date   Chronic sciatica    Diabetes mellitus without complication ([place])    Lumbar radiculopathy    Sciatica    Workers Comp    Past Surgical History: Past Surgical History:  Procedure Laterality Date   COLONOSCOPY  2013   COLONOSCOPY WITH PROPOFOL N/A 06/18/2019   Procedure: COLONOSCOPY WITH PROPOFOL;  Surgeon: Jonathon Bellows, MD;  Location: [facility] ENDOSCOPY;  Service: Gastroenterology;  Laterality: N/A;   etopic pregnancy     FLEXIBLE SIGMOIDOSCOPY N/A 06/24/2019   Procedure: FLEXIBLE SIGMOIDOSCOPY;  Surgeon: Jonathon Bellows, MD;  Location: [facility] ENDOSCOPY;  Service: Gastroenterology;  Laterality: N/A;  NON-SEDATED    Allergies: Allergies as of 04/20/2022   (  No Known Allergies)    Medications: See list   Social History: Social History   Tobacco Use   Smoking status: Never   Smokeless tobacco: Never  Vaping Use   Vaping Use: Never used  Substance Use Topics   Alcohol use: No   Drug use: No    Family Medical History: Family History  Problem Relation Age of Onset   Diabetes Mother    Emphysema Brother    Other Daughter        Scarcoidosis   Aplastic anemia Daughter    Kidney disease Daughter    Cancer  Daughter        Breast Bilateral Mastectomy age 5, kidney    Breast cancer Daughter     Physical Examination: Telephone visit   Medical Decision Making  Imaging: MRI 03/05/2022   IMPRESSION: 1. L2-3: Facet osteoarthritis right more than left with small joint effusion. No compressive stenosis. The facet arthritis could be painful. 2. L3-4: Mild disc bulge. Mild facet and ligamentous hypertrophy. No compressive stenosis. 3. L4-5: Facet osteoarthritis with 1-2 mm of anterolisthesis. Bulging of the disc more towards the right. Mild proximal foraminal encroachment on the right could possibly affect the right L4 nerve. The facet arthritis could be painful. 4. L5-S1: Disc bulge. Facet osteoarthritis. No compressive stenosis. The facet arthritis could be painful.     Electronically Signed   By: Nelson Chimes M.D.   On: 03/06/2022 11:15  L spine flexion and extension 02/16/2022  FINDINGS: Five non rib-bearing lumbar type vertebral bodies are present. Grade 1 anterolisthesis at L4-5 is new. The anterolisthesis is slightly worse with flexion and partially reduces in extension. No other significant listhesis is present. Disc spaces are preserved. Soft tissues are unremarkable. Bilateral facet degenerative changes are present at L4-5 and L5-S1.   IMPRESSION: 1. New grade 1 anterolisthesis at L4-5 with evidence for dynamic instability, increasing with flexion and partially reducing in extension. 2. Degenerative facet changes at L4-5 and L5-S1.     Electronically Signed   By: San Morelle M.D.   On: 02/19/2022 13:48  MRI CT spine 04/14/2022 IMPRESSION: Stable degenerative cervical spondylosis with multilevel disc disease and facet disease. No significant spinal or foraminal stenosis.   Normal and stable MR appearance of the cervical spinal cord.     Electronically Signed   By: Marijo Sanes M.D.   On: 04/14/2022 07:57  IMPRESSION: 1. Mild degenerative  changes in the thoracic spine with disc space narrowing and mild bulging discs at T5-6, T6-7 and T8-9. No significant canal encroachment or neural compression. 2. Normal appearance of the thoracic spinal cord.     Electronically Signed   By: Marijo Sanes M.D.   On: 04/14/2022 07:52   I have personally reviewed the images and agree with the above interpretation.  Assessment and Plan: Meghan Vasquez is a pleasant 62 y.o. female with imbalance causing falls.  She also has numbness and tingling in her hands and arms.    Her MRI findings of her spine do not explain her current symptoms.  I would like to reach out to Dr. Doy Mince to discuss the utility of an MRI scan of the brain.  She may ultimately be found to have a medical cause of her imbalance and weakness.  I do not think there is enough compression on her lumbar MRI scan to warrant intervention at this time.  This visit was performed via telephone.  Patient location: home Provider location: office  I spent a  total of 10 minutes non-face-to-face activities for this visit on the date of this encounter including review of current clinical condition and response to treatment.      Thank you for involving me in the care of this patient.      Vickey Ewbank K. Izora Ribas MD, [facility]

## 2022-05-23 ENCOUNTER — Other Ambulatory Visit: Payer: Self-pay | Admitting: Surgery

## 2022-05-24 ENCOUNTER — Inpatient Hospital Stay
Admission: RE | Admit: 2022-05-24 | Discharge: 2022-05-24 | Disposition: A | Discharge status: Home / Self Care | Payer: Medicare HMO | Source: Ambulatory Visit

## 2022-05-24 HISTORY — DX: Unspecified osteoarthritis, unspecified site: M19.90

## 2022-05-24 HISTORY — DX: Anemia, unspecified: D64.9

## 2022-05-24 HISTORY — DX: Chronic pain syndrome: G89.4

## 2022-05-24 NOTE — Patient Instructions (Signed)
Your procedure is scheduled on:05-29-22 Tuesday Report to the Registration Desk on the 1st floor of the [facility].Then proceed to the 2nd floor Surgery Desk To find out your arrival time, please call [phone] between 1PM - 3PM on:05-28-22 Monday If your arrival time is 6:00 am, do not arrive before that time as the [facility] entrance doors do not open until 6:00 am.  REMEMBER: Instructions that are not followed completely may result in serious medical risk, up to and including death; or upon the discretion of your surgeon and anesthesiologist your surgery may need to be rescheduled.  Do not eat food after midnight the night before surgery.  No gum chewing or hard candies.  You may however, drink Water up to 2 hours before you are scheduled to arrive for your surgery. Do not drink anything within 2 hours of your scheduled arrival time.  In addition, your doctor has ordered for you to drink the provided:  Gatorade G2 Drinking this carbohydrate drink up to two hours before surgery helps to reduce insulin resistance and improve patient outcomes.Please complete drinking 2 hours before scheduled arrival time.   One week prior to surgery: Stop Anti-inflammatories (NSAIDS) such as Advil, Aleve, Ibuprofen, Motrin, Naproxen, Naprosyn and Aspirin based products such as Excedrin, Goody's Powder, BC Powder.You may however, continue to take Tylenol/Tramadol if needed for pain up until the day of surgery.  Stop ANY OVER THE COUNTER supplements/vitamins NOW (05-24-22) until after surgery.  TAKE ONLY THESE MEDICATIONS THE MORNING OF SURGERY WITH A SIP OF WATER: -pantoprazole (PROTONIX) -take one the night before and one on the morning of surgery - helps to prevent nausea after surgery.) -gabapentin (NEURONTIN)   Stop your metFORMIN (GLUCOPHAGE) 2 days prior to surgery-Last dose will be on 05-26-22 Saturday  No Alcohol for 24 hours before or after surgery.  No Smoking including e-cigarettes for 24  hours before surgery.  No chewable tobacco products for at least 6 hours before surgery.  No nicotine patches on the day of surgery.  Do not use any "recreational" drugs for at least a week (preferably 2 weeks) before your surgery.  Please be advised that the combination of cocaine and anesthesia may have negative outcomes, up to and including death. If you test positive for cocaine, your surgery will be cancelled.  On the morning of surgery brush your teeth with toothpaste and water, you may rinse your mouth with mouthwash if you wish. Do not swallow any toothpaste or mouthwash.  Use CHG Soap as directed on instruction sheet.  Do not wear jewelry, make-up, hairpins, clips or nail polish.  Do not wear lotions, powders, or perfumes.   Do not shave body hair from the neck down 48 hours before surgery.  Contact lenses, hearing aids and dentures may not be worn into surgery.  Do not bring valuables to the hospital. [facility] is not responsible for any missing/lost belongings or valuables.   Notify your doctor if there is any change in your medical condition (cold, fever, infection).  Wear comfortable clothing (specific to your surgery type) to the hospital.  After surgery, you can help prevent lung complications by doing breathing exercises.  Take deep breaths and cough every 1-2 hours. Your doctor may order a device called an Incentive Spirometer to help you take deep breaths. When coughing or sneezing, hold a pillow firmly against your incision with both hands. This is called "splinting." Doing this helps protect your incision. It also decreases belly discomfort.  If you  are being admitted to the hospital overnight, leave your suitcase in the car. After surgery it may be brought to your room.  In case of increased patient census, it may be necessary for you, the patient, to continue your postoperative care in the Same Day Surgery department.  If you are being discharged the day of  surgery, you will not be allowed to drive home. You will need a responsible individual to drive you home and stay with you for 24 hours after surgery.   If you are taking public transportation, you will need to have a responsible individual with you.  Please call the [place] Dept. at [phone] if you have any questions about these instructions.  Surgery Visitation Policy:  Patients undergoing a surgery or procedure may have two family members or support persons with them as long as the person is not COVID-19 positive or experiencing its symptoms.   Due to an increase in RSV and influenza rates and associated hospitalizations, children ages 26 and under will not be able to visit patients in [facility]. Masks continue to be strongly recommended.      Preparing for Surgery with CHLORHEXIDINE GLUCONATE (CHG) Soap  Chlorhexidine Gluconate (CHG) Soap  o An antiseptic cleaner that kills germs and bonds with the skin to continue killing germs even after washing  o Used for showering the night before surgery and morning of surgery  Before surgery, you can play an important role by reducing the number of germs on your skin.  CHG (Chlorhexidine gluconate) soap is an antiseptic cleanser which kills germs and bonds with the skin to continue killing germs even after washing.  Please do not use if you have an allergy to CHG or antibacterial soaps. If your skin becomes reddened/irritated stop using the CHG.  1. Shower the NIGHT BEFORE SURGERY and the MORNING OF SURGERY with CHG soap.  2. If you choose to wash your hair, wash your hair first as usual with your normal shampoo.  3. After shampooing, rinse your hair and body thoroughly to remove the shampoo.  4. Use CHG as you would any other liquid soap. You can apply CHG directly to the skin and wash gently with a scrungie or a clean washcloth.  5. Apply the CHG soap to your body only from the neck down. Do not use  on open wounds or open sores. Avoid contact with your eyes, ears, mouth, and genitals (private parts). Wash face and genitals (private parts) with your normal soap.  6. Wash thoroughly, paying special attention to the area where your surgery will be performed.  7. Thoroughly rinse your body with warm water.  8. Do not shower/wash with your normal soap after using and rinsing off the CHG soap.  9. Pat yourself dry with a clean towel.  10. Wear clean pajamas to bed the night before surgery.  12. Place clean sheets on your bed the night of your first shower and do not sleep with pets.  13. Shower again with the CHG soap on the day of surgery prior to arriving at the hospital.  14. Do not apply any deodorants/lotions/powders.  15. Please wear clean clothes to the hospital.    How to Use an Incentive Spirometer An incentive spirometer is a tool that measures how well you are filling your lungs with each breath. Learning to take long, deep breaths using this tool can help you keep your lungs clear and active. This may help to reverse or lessen  your chance of developing breathing (pulmonary) problems, especially infection. You may be asked to use a spirometer: After a surgery. If you have a lung problem or a history of smoking. After a long period of time when you have been unable to move or be active. If the spirometer includes an indicator to show the highest number that you have reached, your health care provider or respiratory therapist will help you set a goal. Keep a log of your progress as told by your health care provider. What are the risks? Breathing too quickly may cause dizziness or cause you to pass out. Take your time so you do not get dizzy or light-headed. If you are in pain, you may need to take pain medicine before doing incentive spirometry. It is harder to take a deep breath if you are having pain. How to use your incentive spirometer  Sit up on the edge of your bed or  on a chair. Hold the incentive spirometer so that it is in an upright position. Before you use the spirometer, breathe out normally. Place the mouthpiece in your mouth. Make sure your lips are closed tightly around it. Breathe in slowly and as deeply as you can through your mouth, causing the piston or the ball to rise toward the top of the chamber. Hold your breath for 3-5 seconds, or for as long as possible. If the spirometer includes a coach indicator, use this to guide you in breathing. Slow down your breathing if the indicator goes above the marked areas. Remove the mouthpiece from your mouth and breathe out normally. The piston or ball will return to the bottom of the chamber. Rest for a few seconds, then repeat the steps 10 or more times. Take your time and take a few normal breaths between deep breaths so that you do not get dizzy or light-headed. Do this every 1-2 hours when you are awake. If the spirometer includes a goal marker to show the highest number you have reached (best effort), use this as a goal to work toward during each repetition. After each set of 10 deep breaths, cough a few times. This will help to make sure that your lungs are clear. If you have an incision on your chest or abdomen from surgery, place a pillow or a rolled-up towel firmly against the incision when you cough. This can help to reduce pain while taking deep breaths and coughing. General tips When you are able to get out of bed: Walk around often. Continue to take deep breaths and cough in order to clear your lungs. Keep using the incentive spirometer until your health care provider says it is okay to stop using it. If you have been in the hospital, you may be told to keep using the spirometer at home. Contact a health care provider if: You are having difficulty using the spirometer. You have trouble using the spirometer as often as instructed. Your pain medicine is not giving enough relief for you to use  the spirometer as told. You have a fever. Get help right away if: You develop shortness of breath. You develop a cough with bloody mucus from the lungs. You have fluid or blood coming from an incision site after you cough. Summary An incentive spirometer is a tool that can help you learn to take long, deep breaths to keep your lungs clear and active. You may be asked to use a spirometer after a surgery, if you have a lung problem or a history  of smoking, or if you have been inactive for a long period of time. Use your incentive spirometer as instructed every 1-2 hours while you are awake. If you have an incision on your chest or abdomen, place a pillow or a rolled-up towel firmly against your incision when you cough. This will help to reduce pain. Get help right away if you have shortness of breath, you cough up bloody mucus, or blood comes from your incision when you cough. This information is not intended to replace advice given to you by your health care provider. Make sure you discuss any questions you have with your health care provider. Document Revised: 06/22/2019 Document Reviewed: 06/22/2019 Elsevier Patient Education  [place].

## 2022-05-25 ENCOUNTER — Other Ambulatory Visit: Payer: Medicare HMO

## 2022-05-25 ENCOUNTER — Encounter
Admission: RE | Admit: 2022-05-25 | Discharge: 2022-05-25 | Disposition: A | Discharge status: Home / Self Care | Payer: Medicare HMO | Source: Ambulatory Visit | Attending: Surgery | Admitting: Surgery

## 2022-05-25 DIAGNOSIS — D649 Anemia, unspecified: Secondary | ICD-10-CM

## 2022-05-25 DIAGNOSIS — Z01818 Encounter for other preprocedural examination: Secondary | ICD-10-CM

## 2022-05-25 DIAGNOSIS — E1122 Type 2 diabetes mellitus with diabetic chronic kidney disease: Secondary | ICD-10-CM

## 2022-05-25 DIAGNOSIS — Z01812 Encounter for preprocedural laboratory examination: Secondary | ICD-10-CM

## 2022-05-25 DIAGNOSIS — Z01810 Encounter for preprocedural cardiovascular examination: Secondary | ICD-10-CM

## 2022-05-25 HISTORY — DX: Family history of other specified conditions: Z84.89

## 2022-05-25 HISTORY — DX: Gastro-esophageal reflux disease without esophagitis: K21.9

## 2022-05-25 HISTORY — DX: Headache, unspecified: R51.9

## 2022-05-25 NOTE — Patient Instructions (Signed)
Your procedure is scheduled on:05-29-22 Tuesday Report to the Registration Desk on the 1st floor of the [facility].Then proceed to the 2nd floor Surgery Desk  To find out your arrival time, please call [phone] between 1PM - 3PM on:05-28-22 Monday If your arrival time is 6:00 am, do not arrive before that time as the [facility] entrance doors do not open until 6:00 am.  REMEMBER: Instructions that are not followed completely may result in serious medical risk, up to and including death; or upon the discretion of your surgeon and anesthesiologist your surgery may need to be rescheduled.  Do not eat food after midnight the night before surgery.  No gum chewing or hard candies.  You may however, drink Water up to 2 hours before you are scheduled to arrive for your surgery. Do not drink anything within 2 hours of your scheduled arrival time.  In addition, your doctor has ordered for you to drink the provided:  Gatorade G2 (Given to you instead of Ensure because you are Diabetic) Drinking this carbohydrate drink up to two hours before surgery helps to reduce insulin resistance and improve patient outcomes. Please complete drinking 2 hours before scheduled arrival time.  One week prior to surgery: Stop Anti-inflammatories (NSAIDS) such as Advil, Aleve, Ibuprofen, Motrin, Naproxen, Naprosyn and Aspirin based products such as Excedrin, Goody's Powder, BC Powder.You may however, continue to take Tylenol/Tramadol if needed for pain up until the day of surgery.  Stop ANY OVER THE COUNTER supplements/vitamins NOW (05-25-22) until after surgery (Vitamin B12, C, D, E and Calcium-Magnesium)   TAKE ONLY THESE MEDICATIONS THE MORNING OF SURGERY WITH A SIP OF WATER: -gabapentin (NEURONTIN)  -pantoprazole (PROTONIX) -take one the night before and one on the morning of surgery - helps to prevent nausea after surgery.)  Stop your 81 mg Aspirin NOW (05-25-22)   Stop your metFORMIN (GLUCOPHAGE) 2 days prior  to surgery -Last dose will be on 05-26-22 Saturday  No Alcohol for 24 hours before or after surgery.  No Smoking including e-cigarettes for 24 hours before surgery.  No chewable tobacco products for at least 6 hours before surgery.  No nicotine patches on the day of surgery.  Do not use any "recreational" drugs for at least a week (preferably 2 weeks) before your surgery.  Please be advised that the combination of cocaine and anesthesia may have negative outcomes, up to and including death. If you test positive for cocaine, your surgery will be cancelled.  On the morning of surgery brush your teeth with toothpaste and water, you may rinse your mouth with mouthwash if you wish. Do not swallow any toothpaste or mouthwash.  Use CHG Soap as directed on instruction sheet.  Do not wear jewelry, make-up, hairpins, clips or nail polish.  Do not wear lotions, powders, or perfumes.   Do not shave body hair from the neck down 48 hours before surgery.  Contact lenses, hearing aids and dentures may not be worn into surgery.  Do not bring valuables to the hospital. [facility] is not responsible for any missing/lost belongings or valuables. .   Notify your doctor if there is any change in your medical condition (cold, fever, infection).  Wear comfortable clothing (specific to your surgery type) to the hospital.  After surgery, you can help prevent lung complications by doing breathing exercises.  Take deep breaths and cough every 1-2 hours. Your doctor may order a device called an Incentive Spirometer to help you take deep breaths. When coughing  or sneezing, hold a pillow firmly against your incision with both hands. This is called "splinting." Doing this helps protect your incision. It also decreases belly discomfort.  If you are being admitted to the hospital overnight, leave your suitcase in the car. After surgery it may be brought to your room.  In case of increased patient census, it may  be necessary for you, the patient, to continue your postoperative care in the Same Day Surgery department.  If you are being discharged the day of surgery, you will not be allowed to drive home. You will need a responsible individual to drive you home and stay with you for 24 hours after surgery.   If you are taking public transportation, you will need to have a responsible individual with you.  Please call the [place] Dept. at [phone] if you have any questions about these instructions.  Surgery Visitation Policy:  Patients undergoing a surgery or procedure may have two family members or support persons with them as long as the person is not COVID-19 positive or experiencing its symptoms.  Due to an increase in RSV and influenza rates and associated hospitalizations, children ages 37 and under will not be able to visit patients in [facility]. Masks continue to be strongly recommended.     Preparing for Surgery with CHLORHEXIDINE GLUCONATE (CHG) Soap  Chlorhexidine Gluconate (CHG) Soap  o An antiseptic cleaner that kills germs and bonds with the skin to continue killing germs even after washing  o Used for showering the night before surgery and morning of surgery  Before surgery, you can play an important role by reducing the number of germs on your skin.  CHG (Chlorhexidine gluconate) soap is an antiseptic cleanser which kills germs and bonds with the skin to continue killing germs even after washing.  Please do not use if you have an allergy to CHG or antibacterial soaps. If your skin becomes reddened/irritated stop using the CHG.  1. Shower the NIGHT BEFORE SURGERY and the MORNING OF SURGERY with CHG soap.  2. If you choose to wash your hair, wash your hair first as usual with your normal shampoo.  3. After shampooing, rinse your hair and body thoroughly to remove the shampoo.  4. Use CHG as you would any other liquid soap. You can apply CHG  directly to the skin and wash gently with a scrungie or a clean washcloth.  5. Apply the CHG soap to your body only from the neck down. Do not use on open wounds or open sores. Avoid contact with your eyes, ears, mouth, and genitals (private parts). Wash face and genitals (private parts) with your normal soap.  6. Wash thoroughly, paying special attention to the area where your surgery will be performed.  7. Thoroughly rinse your body with warm water.  8. Do not shower/wash with your normal soap after using and rinsing off the CHG soap.  9. Pat yourself dry with a clean towel.  10. Wear clean pajamas to bed the night before surgery.  12. Place clean sheets on your bed the night of your first shower and do not sleep with pets.  13. Shower again with the CHG soap on the day of surgery prior to arriving at the hospital.  14. Do not apply any deodorants/lotions/powders.  15. Please wear clean clothes to the hospital.   How to Use an Incentive Spirometer An incentive spirometer is a tool that measures how well you are filling your lungs with  each breath. Learning to take long, deep breaths using this tool can help you keep your lungs clear and active. This may help to reverse or lessen your chance of developing breathing (pulmonary) problems, especially infection. You may be asked to use a spirometer: After a surgery. If you have a lung problem or a history of smoking. After a long period of time when you have been unable to move or be active. If the spirometer includes an indicator to show the highest number that you have reached, your health care provider or respiratory therapist will help you set a goal. Keep a log of your progress as told by your health care provider. What are the risks? Breathing too quickly may cause dizziness or cause you to pass out. Take your time so you do not get dizzy or light-headed. If you are in pain, you may need to take pain medicine before doing incentive  spirometry. It is harder to take a deep breath if you are having pain. How to use your incentive spirometer  Sit up on the edge of your bed or on a chair. Hold the incentive spirometer so that it is in an upright position. Before you use the spirometer, breathe out normally. Place the mouthpiece in your mouth. Make sure your lips are closed tightly around it. Breathe in slowly and as deeply as you can through your mouth, causing the piston or the ball to rise toward the top of the chamber. Hold your breath for 3-5 seconds, or for as long as possible. If the spirometer includes a coach indicator, use this to guide you in breathing. Slow down your breathing if the indicator goes above the marked areas. Remove the mouthpiece from your mouth and breathe out normally. The piston or ball will return to the bottom of the chamber. Rest for a few seconds, then repeat the steps 10 or more times. Take your time and take a few normal breaths between deep breaths so that you do not get dizzy or light-headed. Do this every 1-2 hours when you are awake. If the spirometer includes a goal marker to show the highest number you have reached (best effort), use this as a goal to work toward during each repetition. After each set of 10 deep breaths, cough a few times. This will help to make sure that your lungs are clear. If you have an incision on your chest or abdomen from surgery, place a pillow or a rolled-up towel firmly against the incision when you cough. This can help to reduce pain while taking deep breaths and coughing. General tips When you are able to get out of bed: Walk around often. Continue to take deep breaths and cough in order to clear your lungs. Keep using the incentive spirometer until your health care provider says it is okay to stop using it. If you have been in the hospital, you may be told to keep using the spirometer at home. Contact a health care provider if: You are having difficulty  using the spirometer. You have trouble using the spirometer as often as instructed. Your pain medicine is not giving enough relief for you to use the spirometer as told. You have a fever. Get help right away if: You develop shortness of breath. You develop a cough with bloody mucus from the lungs. You have fluid or blood coming from an incision site after you cough. Summary An incentive spirometer is a tool that can help you learn to take long, deep breaths  to keep your lungs clear and active. You may be asked to use a spirometer after a surgery, if you have a lung problem or a history of smoking, or if you have been inactive for a long period of time. Use your incentive spirometer as instructed every 1-2 hours while you are awake. If you have an incision on your chest or abdomen, place a pillow or a rolled-up towel firmly against your incision when you cough. This will help to reduce pain. Get help right away if you have shortness of breath, you cough up bloody mucus, or blood comes from your incision when you cough. This information is not intended to replace advice given to you by your health care provider. Make sure you discuss any questions you have with your health care provider. Document Revised: 06/22/2019 Document Reviewed: 06/22/2019 Elsevier Patient Education  [place].

## 2022-05-25 NOTE — Progress Notes (Signed)
Called pt multiple times yesterday and had to leave a message and then called again today and had to leave another message. Informed pt on vm that she is going to have to have labs and EKG done prior to surgery so it is very important that she call me back. Still no response. Called Tiffany at Dr Poggi's office to let her know this and that we would not be calling pt again since this is 2 straight days she has not called back. Tiffany states she will try and call pt to let her know she needs to call us back

## 2022-05-28 ENCOUNTER — Encounter
Admission: RE | Admit: 2022-05-28 | Discharge: 2022-05-28 | Disposition: A | Discharge status: Home / Self Care | Payer: Medicare HMO | Source: Ambulatory Visit | Attending: Surgery | Admitting: Surgery

## 2022-05-28 DIAGNOSIS — D649 Anemia, unspecified: Secondary | ICD-10-CM | POA: Insufficient documentation

## 2022-05-28 DIAGNOSIS — Z01818 Encounter for other preprocedural examination: Secondary | ICD-10-CM | POA: Insufficient documentation

## 2022-05-28 DIAGNOSIS — N189 Chronic kidney disease, unspecified: Secondary | ICD-10-CM | POA: Insufficient documentation

## 2022-05-28 DIAGNOSIS — E1122 Type 2 diabetes mellitus with diabetic chronic kidney disease: Secondary | ICD-10-CM | POA: Diagnosis not present

## 2022-05-28 DIAGNOSIS — Z01812 Encounter for preprocedural laboratory examination: Secondary | ICD-10-CM

## 2022-05-28 DIAGNOSIS — R9431 Abnormal electrocardiogram [ECG] [EKG]: Secondary | ICD-10-CM | POA: Diagnosis not present

## 2022-05-28 DIAGNOSIS — Z01810 Encounter for preprocedural cardiovascular examination: Secondary | ICD-10-CM

## 2022-05-28 LAB — CBC
HCT: 32.1 % — ABNORMAL LOW (ref 36.0–46.0)
Hemoglobin: 10.3 g/dL — ABNORMAL LOW (ref 12.0–15.0)
MCH: 29.3 pg (ref 26.0–34.0)
MCHC: 32.1 g/dL (ref 30.0–36.0)
MCV: 91.2 fL (ref 80.0–100.0)
Platelets: 267 10*3/uL (ref 150–400)
RBC: 3.52 MIL/uL — ABNORMAL LOW (ref 3.87–5.11)
RDW: 14.5 % (ref 11.5–15.5)
WBC: 8.7 10*3/uL (ref 4.0–10.5)
nRBC: 0 % (ref 0.0–0.2)

## 2022-05-28 LAB — BASIC METABOLIC PANEL
Anion gap: 9 (ref 5–15)
BUN: 21 mg/dL — ABNORMAL HIGH (ref 6–20)
CO2: 25 mmol/L (ref 22–32)
Calcium: 9 mg/dL (ref 8.9–10.3)
Chloride: 107 mmol/L (ref 98–111)
Creatinine, Ser: 1.28 mg/dL — ABNORMAL HIGH (ref 0.44–1.00)
GFR, Estimated: 48 mL/min — ABNORMAL LOW (ref >60)
Glucose, Bld: 209 mg/dL — ABNORMAL HIGH (ref 70–99)
Potassium: 4 mmol/L (ref 3.5–5.1)
Sodium: 141 mmol/L (ref 135–145)

## 2022-05-28 MED ORDER — CEFAZOLIN SODIUM-DEXTROSE 2-4 GM/100ML-% IV SOLN
2.0000 g | INTRAVENOUS | Status: AC
  Administered 2022-05-29: 2 g via INTRAVENOUS

## 2022-05-28 MED ORDER — CHLORHEXIDINE GLUCONATE 0.12 % MT SOLN: 15.0000 mL | Freq: Once | OROMUCOSAL | Status: AC

## 2022-05-28 MED ORDER — SODIUM CHLORIDE 0.9 % IV SOLN: INTRAVENOUS | Status: DC

## 2022-05-28 MED ORDER — ORAL CARE MOUTH RINSE: 15.0000 mL | Freq: Once | OROMUCOSAL | Status: AC

## 2022-05-29 ENCOUNTER — Encounter
Admission: RE | Disposition: A | Discharge status: Home / Self Care | Payer: Self-pay | Source: Home / Self Care | Attending: Surgery

## 2022-05-29 ENCOUNTER — Ambulatory Visit: Payer: Medicare HMO | Admitting: Urgent Care

## 2022-05-29 ENCOUNTER — Ambulatory Visit
Admission: RE | Admit: 2022-05-29 | Discharge: 2022-05-29 | Disposition: A | Discharge status: Home / Self Care | Payer: Medicare HMO | Attending: Surgery | Admitting: Surgery

## 2022-05-29 ENCOUNTER — Other Ambulatory Visit: Payer: Self-pay

## 2022-05-29 ENCOUNTER — Encounter: Payer: Self-pay | Admitting: Surgery

## 2022-05-29 DIAGNOSIS — Z6838 Body mass index (BMI) 38.0-38.9, adult: Secondary | ICD-10-CM | POA: Insufficient documentation

## 2022-05-29 DIAGNOSIS — Z01818 Encounter for other preprocedural examination: Secondary | ICD-10-CM

## 2022-05-29 DIAGNOSIS — Z791 Long term (current) use of non-steroidal anti-inflammatories (NSAID): Secondary | ICD-10-CM | POA: Insufficient documentation

## 2022-05-29 DIAGNOSIS — Z79899 Other long term (current) drug therapy: Secondary | ICD-10-CM | POA: Insufficient documentation

## 2022-05-29 DIAGNOSIS — G894 Chronic pain syndrome: Secondary | ICD-10-CM

## 2022-05-29 DIAGNOSIS — K219 Gastro-esophageal reflux disease without esophagitis: Secondary | ICD-10-CM | POA: Diagnosis not present

## 2022-05-29 DIAGNOSIS — Z7984 Long term (current) use of oral hypoglycemic drugs: Secondary | ICD-10-CM | POA: Insufficient documentation

## 2022-05-29 DIAGNOSIS — E119 Type 2 diabetes mellitus without complications: Secondary | ICD-10-CM | POA: Insufficient documentation

## 2022-05-29 DIAGNOSIS — G5601 Carpal tunnel syndrome, right upper limb: Secondary | ICD-10-CM | POA: Diagnosis present

## 2022-05-29 HISTORY — PX: CARPAL TUNNEL RELEASE: SHX101

## 2022-05-29 LAB — GLUCOSE, CAPILLARY
Glucose-Capillary: 170 mg/dL — ABNORMAL HIGH (ref 70–99)
Glucose-Capillary: 143 mg/dL — ABNORMAL HIGH (ref 70–99)

## 2022-05-29 SURGERY — RELEASE, CARPAL TUNNEL, ENDOSCOPIC
Anesthesia: General | Site: Wrist | Laterality: Right

## 2022-05-29 MED ORDER — PHENYLEPHRINE HCL (PRESSORS) 10 MG/ML IV SOLN
INTRAVENOUS | Status: AC
  Filled 2022-05-29: qty 1

## 2022-05-29 MED ORDER — CHLORHEXIDINE GLUCONATE 0.12 % MT SOLN
OROMUCOSAL | Status: AC
  Administered 2022-05-29: 15 mL via OROMUCOSAL
  Filled 2022-05-29: qty 15

## 2022-05-29 MED ORDER — 0.9 % SODIUM CHLORIDE (POUR BTL) OPTIME
TOPICAL | Status: DC | PRN
  Administered 2022-05-29: 500 mL

## 2022-05-29 MED ORDER — BUPIVACAINE HCL (PF) 0.5 % IJ SOLN
INTRAMUSCULAR | Status: AC
  Filled 2022-05-29: qty 30

## 2022-05-29 MED ORDER — DEXAMETHASONE SODIUM PHOSPHATE 10 MG/ML IJ SOLN
INTRAMUSCULAR | Status: AC
  Filled 2022-05-29: qty 1

## 2022-05-29 MED ORDER — PHENYLEPHRINE 80 MCG/ML (10ML) SYRINGE FOR IV PUSH (FOR BLOOD PRESSURE SUPPORT)
PREFILLED_SYRINGE | INTRAVENOUS | Status: AC
  Filled 2022-05-29: qty 10

## 2022-05-29 MED ORDER — LIDOCAINE HCL (PF) 2 % IJ SOLN
INTRAMUSCULAR | Status: AC
  Filled 2022-05-29: qty 5

## 2022-05-29 MED ORDER — TRAMADOL HCL 50 MG PO TABS: 50.0000 mg | ORAL_TABLET | Freq: Four times a day (QID) | ORAL | Status: DC | PRN

## 2022-05-29 MED ORDER — ONDANSETRON HCL 4 MG/2ML IJ SOLN: 4.0000 mg | Freq: Once | INTRAMUSCULAR | Status: DC | PRN

## 2022-05-29 MED ORDER — OXYCODONE HCL 5 MG/5ML PO SOLN: 5.0000 mg | Freq: Once | ORAL | Status: DC | PRN

## 2022-05-29 MED ORDER — ACETAMINOPHEN 10 MG/ML IV SOLN: 1000.0000 mg | Freq: Once | INTRAVENOUS | Status: DC | PRN

## 2022-05-29 MED ORDER — FENTANYL CITRATE (PF) 100 MCG/2ML IJ SOLN
INTRAMUSCULAR | Status: DC | PRN
  Administered 2022-05-29 (×2): 25 ug via INTRAVENOUS

## 2022-05-29 MED ORDER — PROPOFOL 10 MG/ML IV BOLUS
INTRAVENOUS | Status: DC | PRN
  Administered 2022-05-29: 150 mg via INTRAVENOUS

## 2022-05-29 MED ORDER — BUPIVACAINE HCL (PF) 0.5 % IJ SOLN
INTRAMUSCULAR | Status: DC | PRN
  Administered 2022-05-29: 10 mL

## 2022-05-29 MED ORDER — PHENYLEPHRINE HCL (PRESSORS) 10 MG/ML IV SOLN
INTRAVENOUS | Status: DC | PRN
  Administered 2022-05-29 (×3): 120 ug via INTRAVENOUS

## 2022-05-29 MED ORDER — SODIUM CHLORIDE 0.9 % IV SOLN: INTRAVENOUS | Status: DC

## 2022-05-29 MED ORDER — CEFAZOLIN SODIUM-DEXTROSE 2-4 GM/100ML-% IV SOLN
INTRAVENOUS | Status: AC
  Filled 2022-05-29: qty 100

## 2022-05-29 MED ORDER — ONDANSETRON HCL 4 MG/2ML IJ SOLN
INTRAMUSCULAR | Status: DC | PRN
  Administered 2022-05-29: 4 mg via INTRAVENOUS

## 2022-05-29 MED ORDER — FENTANYL CITRATE (PF) 100 MCG/2ML IJ SOLN: 25.0000 ug | INTRAMUSCULAR | Status: DC | PRN

## 2022-05-29 MED ORDER — KETAMINE HCL 50 MG/5ML IJ SOSY
PREFILLED_SYRINGE | INTRAMUSCULAR | Status: AC
  Filled 2022-05-29: qty 5

## 2022-05-29 MED ORDER — METOCLOPRAMIDE HCL 10 MG PO TABS: 5.0000 mg | ORAL_TABLET | Freq: Three times a day (TID) | ORAL | Status: DC | PRN

## 2022-05-29 MED ORDER — MIDAZOLAM HCL 2 MG/2ML IJ SOLN
INTRAMUSCULAR | Status: AC
  Filled 2022-05-29: qty 2

## 2022-05-29 MED ORDER — METOCLOPRAMIDE HCL 5 MG/ML IJ SOLN: 5.0000 mg | Freq: Three times a day (TID) | INTRAMUSCULAR | Status: DC | PRN

## 2022-05-29 MED ORDER — DEXAMETHASONE SODIUM PHOSPHATE 10 MG/ML IJ SOLN
INTRAMUSCULAR | Status: DC | PRN
  Administered 2022-05-29: 5 mg via INTRAVENOUS

## 2022-05-29 MED ORDER — ONDANSETRON HCL 4 MG/2ML IJ SOLN: 4.0000 mg | Freq: Four times a day (QID) | INTRAMUSCULAR | Status: DC | PRN

## 2022-05-29 MED ORDER — FENTANYL CITRATE (PF) 100 MCG/2ML IJ SOLN
INTRAMUSCULAR | Status: AC
  Filled 2022-05-29: qty 2

## 2022-05-29 MED ORDER — PROPOFOL 10 MG/ML IV BOLUS
INTRAVENOUS | Status: AC
  Filled 2022-05-29: qty 20

## 2022-05-29 MED ORDER — KETAMINE HCL 10 MG/ML IJ SOLN
INTRAMUSCULAR | Status: DC | PRN
  Administered 2022-05-29: 30 mg via INTRAVENOUS

## 2022-05-29 MED ORDER — LIDOCAINE HCL (CARDIAC) PF 100 MG/5ML IV SOSY
PREFILLED_SYRINGE | INTRAVENOUS | Status: DC | PRN
  Administered 2022-05-29: 100 mg via INTRAVENOUS

## 2022-05-29 MED ORDER — MIDAZOLAM HCL 2 MG/2ML IJ SOLN
INTRAMUSCULAR | Status: DC | PRN
  Administered 2022-05-29: 2 mg via INTRAVENOUS

## 2022-05-29 MED ORDER — ONDANSETRON HCL 4 MG PO TABS: 4.0000 mg | ORAL_TABLET | Freq: Four times a day (QID) | ORAL | Status: DC | PRN

## 2022-05-29 MED ORDER — OXYCODONE HCL 5 MG PO TABS: 5.0000 mg | ORAL_TABLET | Freq: Once | ORAL | Status: DC | PRN

## 2022-05-29 MED ORDER — ONDANSETRON HCL 4 MG/2ML IJ SOLN
INTRAMUSCULAR | Status: AC
  Filled 2022-05-29: qty 2

## 2022-05-29 SURGICAL SUPPLY — 35 items
APL PRP STRL LF DISP 70% ISPRP (MISCELLANEOUS) ×2
BNDG CMPR 5X4 CHSV STRCH STRL (GAUZE/BANDAGES/DRESSINGS) ×1
BNDG COHESIVE 4X5 TAN STRL LF (GAUZE/BANDAGES/DRESSINGS) ×1 IMPLANT
BNDG ELASTIC 2X5.8 VLCR STR LF (GAUZE/BANDAGES/DRESSINGS) ×1 IMPLANT
BNDG ESMARCH 4 X 12 STRL LF (GAUZE/BANDAGES/DRESSINGS) ×1
BNDG ESMARCH 4X12 STRL LF (GAUZE/BANDAGES/DRESSINGS) ×1 IMPLANT
CHLORAPREP W/TINT 26 (MISCELLANEOUS) ×1 IMPLANT
CORD BIP STRL DISP 12FT (MISCELLANEOUS) ×1 IMPLANT
CUFF TOURN SGL QUICK 18X4 (TOURNIQUET CUFF) ×1 IMPLANT
DRAPE SURG 17X11 SM STRL (DRAPES) ×1 IMPLANT
FORCEPS JEWEL BIP 4-3/4 STR (INSTRUMENTS) ×1 IMPLANT
GAUZE SPONGE 4X4 12PLY STRL (GAUZE/BANDAGES/DRESSINGS) ×1 IMPLANT
GAUZE XEROFORM 1X8 LF (GAUZE/BANDAGES/DRESSINGS) ×1 IMPLANT
GLOVE BIO SURGEON STRL SZ8 (GLOVE) ×1 IMPLANT
GLOVE SURG UNDER LTX SZ8 (GLOVE) ×1 IMPLANT
GOWN STRL REUS W/ TWL LRG LVL3 (GOWN DISPOSABLE) ×1 IMPLANT
GOWN STRL REUS W/ TWL XL LVL3 (GOWN DISPOSABLE) ×1 IMPLANT
GOWN STRL REUS W/TWL LRG LVL3 (GOWN DISPOSABLE) ×1
GOWN STRL REUS W/TWL XL LVL3 (GOWN DISPOSABLE) ×1
KIT CARPAL TUNNEL (MISCELLANEOUS) ×1
KIT ESCP INSRT D SLOT CANN KN (MISCELLANEOUS) ×1 IMPLANT
KIT TURNOVER KIT A (KITS) ×1 IMPLANT
MANIFOLD NEPTUNE II (INSTRUMENTS) ×1 IMPLANT
NS IRRIG 500ML POUR BTL (IV SOLUTION) ×1 IMPLANT
PACK EXTREMITY ARMC (MISCELLANEOUS) ×1 IMPLANT
SPLINT WRIST LG LT TX990309 (SOFTGOODS) IMPLANT
SPLINT WRIST LG RT TX900304 (SOFTGOODS) IMPLANT
SPLINT WRIST M LT TX990308 (SOFTGOODS) IMPLANT
SPLINT WRIST M RT TX990303 (SOFTGOODS) IMPLANT
SPLINT WRIST XL LT TX990310 (SOFTGOODS) IMPLANT
SPLINT WRIST XL RT TX990305 (SOFTGOODS) IMPLANT
STOCKINETTE IMPERVIOUS 9X36 MD (GAUZE/BANDAGES/DRESSINGS) ×1 IMPLANT
SUT PROLENE 4 0 PS 2 18 (SUTURE) ×1 IMPLANT
TRAP FLUID SMOKE EVACUATOR (MISCELLANEOUS) ×1 IMPLANT
WATER STERILE IRR 500ML POUR (IV SOLUTION) ×1 IMPLANT

## 2022-05-29 NOTE — H&P (Signed)
History of Present Illness:  Meghan Vasquez is a 63 y.o. female who presents for evaluation and treatment of her bilateral hand and wrist pain and paresthesias, right more symptomatic than left. These symptoms have been present for 8 years or longer and developed without any specific cause or injury. However, the patient worked in a [employer] and wonders if the repetitive nature of these tasks might have contributed to the onset of her symptoms. She describes her symptoms as "constant" and rates her pain at 4/10 on today's visit. She has been taking gabapentin, tramadol, and meloxicam on a regular basis with limited benefit. She also has been using Velcro wrist splints at night, again with limited benefit. The patient states that she had attended a course of occupational therapy which also provided little relief of her symptoms. Her symptoms will awaken her from sleep at night and are worse with more repetitive sustained activities such as driving or performing fine motor activities. The patient underwent an EMG/NCV of both upper extremities and was advised to discuss surgical intervention with orthopedics, prompting this visit.  Current Outpatient Medications: 8HR MUSCLE ACHES-PAIN 650 mg ER tablet  aspirin 81 MG EC tablet Take by mouth  atorvastatin (LIPITOR) 10 MG tablet TAKE 1 TABLET BY MOUTH EVERY EVENING FOR HIGH CHOLESTEROL AND STROKE/HEART ATTACK PREVENTION  cyclobenzaprine (FLEXERIL) 10 MG tablet  esomeprazole (NEXIUM) 40 MG DR capsule Take 1 capsule (40 mg total) by mouth once daily 60 capsule 0  gabapentin (NEURONTIN) 300 MG capsule  ibuprofen (ADVIL,MOTRIN) 200 MG tablet Take 200 mg by mouth every 8 (eight) hours as needed  meclizine (ANTIVERT) 25 mg tablet Take by mouth  meloxicam (MOBIC) 15 MG tablet TAKE 1 TABLET BY MOUTH ONCE A DAY FOR PAIN  metFORMIN (GLUCOPHAGE) 500 MG tablet TAKE 1 TABLET BY MOUTH ONCE DAILY FOR DIABETES 1  ONETOUCH DELICA LANCETS AS NEEDED FOR BLOOD GLUOCOSE  MONITORING 3 TIMES DAILY. DX E11.9  ONETOUCH ULTRA TEST test strip TEST BLOOD SUGAR 3 TIMES A DAY  ONETOUCH ULTRA2 METER Misc USE AS DIRECTED FOR DIABETES, DX E11.9  pantoprazole (PROTONIX) 40 MG DR tablet 1 BY MOUTH DAILY FOR REFLUX/HEARTBURN  traMADol (ULTRAM) 50 mg tablet Take 50 mg by mouth every 8 (eight) hours as needed   Allergies: No Known Allergies  Past Medical History:  Anemia  Breast cyst  Chronic knee pain  Chronic sciatica  Diabetes mellitus type 2, uncomplicated (CMS-HCC)  Ectopic pregnancy  Lumbar radiculopathy   Past Surgical History:  COLONOSCOPY 2013  SALPINGECTOMY   Family History:  Diabetes Daughter  Cancer Daughter  Breast cancer Daughter 1   Social History:   Socioeconomic History:  Marital status: Married  Tobacco Use  Smoking status: Never  Smokeless tobacco: Never  Vaping Use  Vaping Use: Never used  Substance and Sexual Activity  Alcohol use: No  Drug use: No  Sexual activity: Defer   Review of Systems:  A comprehensive 14 point ROS was performed, reviewed, and the pertinent orthopaedic findings are documented in the HPI.  Physical Exam: Vitals:  05/18/22 1140  BP: 124/86  Weight: 97.9 kg (215 lb 12.8 oz)  Height: 160 cm (5' 3"$ )  PainSc: 4  PainLoc: Hand   General/Constitutional: Pleasant overweight middle-aged female in no acute distress. Neuro/Psych: Normal mood and affect, oriented to person, place and time. Eyes: Non-icteric. Pupils are equal, round, and reactive to light, and exhibit synchronous movement. ENT: Unremarkable. Lymphatic: No palpable adenopathy. Respiratory: Lungs clear to auscultation, Normal chest excursion, No  wheezes, and Non-labored breathing Cardiovascular: Regular rate and rhythm. No murmurs. and No edema, swelling or tenderness, except as noted in detailed exam. Integumentary: No impressive skin lesions present, except as noted in detailed exam. Musculoskeletal: Unremarkable, except as noted in detailed  exam.  Right wrist/hand exam: Skin inspection of the right wrist and hand is unremarkable. No swelling, erythema, ecchymosis, abrasions, or other skin abnormalities are identified. She has no tenderness to palpation over the dorsal or volar aspect of the wrist, nor she have any tenderness to palpation over the dorsal or palmar aspect of her hand. She exhibits full active and passive range of motion of the wrist without any pain or catching. She is able to active flex extend all digits fully without any pain or triggering. She is neurovascularly intact to all digits. She has an equivocally positive Phalen's test and a mildly positive Tinel's over the carpal tunnel.  EMG results:  The results of a recent EMG of both upper extremities is available for review and has been reviewed by myself. By report, the study demonstrates evidence of "bilateral mild median neuropathy at the wrists, right worse than left, consistent with bilateral carpal tunnel syndrome." This report was reviewed by myself and discussed with the patient.  Assessment: 1. Severe obesity (BMI 35.0-39.9) with comorbidity (CMS-HCC)  2. Carpal tunnel syndrome, right.   Plan: The treatment options were discussed with the patient. In addition, patient educational materials were provided regarding the diagnosis and treatment options. The patient is quite frustrated by her continued symptoms and function limitations, and is ready to consider more aggressive treatment options, especially for her right wrist symptoms. Therefore, I have recommended a surgical procedure, specifically an endoscopic right carpal tunnel release. The procedure was discussed with the patient, as were the potential risks (including bleeding, infection, nerve and/or blood vessel injury, persistent or recurrent pain/paresthesias, weakness of grip, need for further surgery, blood clots, strokes, heart attacks and/or arhythmias, pneumonia, etc.) and benefits. The patient states  her understanding and wishes to proceed. All of the patient's questions and concerns were answered. She can call any time with further concerns. She will follow up post-surgery, routine.    H&P reviewed and patient re-examined. No changes.

## 2022-05-29 NOTE — Discharge Instructions (Addendum)
AMBULATORY SURGERY  DISCHARGE INSTRUCTIONS   The drugs that you were given will stay in your system until tomorrow so for the next 24 hours you should not:  Drive an automobile Make any legal decisions Drink any alcoholic beverage   You may resume regular meals tomorrow.  Today it is better to start with liquids and gradually work up to solid foods.  You may eat anything you prefer, but it is better to start with liquids, then soup and crackers, and gradually work up to solid foods.   Please notify your doctor immediately if you have any unusual bleeding, trouble breathing, redness and pain at the surgery site, drainage, fever, or pain not relieved by medication.    Additional Instructions:   Orthopedic discharge instructions: Keep dressing dry and intact. Keep hand elevated above heart level. May shower after dressing removed on postop day 4 (Saturday). Cover sutures with Band-Aids after drying off, then reapply Velcro splint. Apply ice to affected area frequently. Take ibuprofen 600-800 mg TID with meals for 3-5 days, then as necessary. Take ES Tylenol or pain medication as prescribed when needed.  Return for follow-up in 10-14 days or as scheduled.         Please contact your physician with any problems or Same Day Surgery at [phone], Monday through Friday 6 am to 4 pm, or [place] [facility] number at [phone].

## 2022-05-29 NOTE — Anesthesia Postprocedure Evaluation (Signed)
Anesthesia Post Note  Patient: Meghan Vasquez  Procedure(s) Performed: CARPAL TUNNEL RELEASE ENDOSCOPIC (Right: Wrist)  Patient location during evaluation: PACU Anesthesia Type: General Level of consciousness: awake and alert Pain management: pain level controlled Vital Signs Assessment: post-procedure vital signs reviewed and stable Respiratory status: spontaneous breathing, nonlabored ventilation, respiratory function stable and patient connected to nasal cannula oxygen Cardiovascular status: blood pressure returned to baseline and stable Postop Assessment: no apparent nausea or vomiting Anesthetic complications: no   No notable events documented.   Last Vitals:  Vitals:   05/29/22 1130 05/29/22 1141  BP: (!) 148/87 (!) 166/95  Pulse: 92 88  Resp: 18 16  Temp: 36.7 C (!) 36.3 C  SpO2: 99% 99%    Last Pain:  Vitals:   05/29/22 1141  TempSrc: Temporal  PainSc: 0-No pain                 Arita Miss

## 2022-05-29 NOTE — Op Note (Signed)
05/29/2022  10:38 AM  Patient:   Meghan Vasquez  Pre-Op Diagnosis:   Right carpal tunnel syndrome.  Post-Op Diagnosis:   Same.  Procedure:   Endoscopic right carpal tunnel release.  Surgeon:   Pascal Lux, MD  Anesthesia:   General LMA  Findings:   As above.  Complications:   None  EBL:   0 cc  Fluids:   700 cc crystalloid  TT:   12 minutes at 250 mmHg  Drains:   None  Closure:   4-0 Prolene interrupted sutures  Brief Clinical Note:   The patient is a 62 year old female with a history of gradually worsening pain and paresthesias to her right hand. Her symptoms have progressed despite medications, activity modification, etc. Her history and examination are consistent with carpal tunnel syndrome, confirmed by EMG. The patient presents at this time for an endoscopic right carpal tunnel release.   Procedure:   The patient was brought into the operating room and lain in the supine position. After adequate general laryngeal mask anesthesia was obtained, the right hand and upper extremity were prepped with ChloraPrep solution before being draped sterilely. Preoperative antibiotics were administered. A timeout was performed to verify the appropriate surgical site before the limb was exsanguinated with an Esmarch and the tourniquet inflated to 250 mmHg.   An approximately 1.5-2 cm incision was made over the volar wrist flexion crease, centered over the palmaris longus tendon. The incision was carried down through the subcutaneous tissues with care taken to identify and protect any neurovascular structures. The distal forearm fascia was penetrated just proximal to the transverse carpal ligament. The soft tissues were released off the superficial and deep surfaces of the distal forearm fascia and this was released proximally for 3-4 cm under direct visualization.  Attention was directed distally. The Soil scientist was passed beneath the transverse carpal ligament along the ulnar aspect of  the carpal tunnel and used to release any adhesions as well as to remove any adherent synovial tissue before first the smaller then the larger of the two dilators were passed beneath the transverse carpal ligament along the ulnar margin of the carpal tunnel. The slotted cannula was introduced and the endoscope was placed into the slotted cannula and the undersurface of the transverse carpal ligament visualized. The distal margin of the transverse carpal ligament was marked by placing a 25-gauge needle percutaneously at Loa cardinal point so that it entered the distal portion of the slotted cannula. Under endoscopic visualization, the transverse carpal ligament was released from proximal to distal using the end-cutting blade. A second pass was performed to ensure complete release of the ligament. The adequacy of release was verified both endoscopically and by palpation using the freer elevator.  The wound was irrigated thoroughly with sterile saline solution before being closed using 4-0 Prolene interrupted sutures. A total of 10 cc of 0.5% plain Sensorcaine was injected in and around the incision before a sterile bulky dressing was applied to the wound. The patient was placed into a volar wrist splint before being awakened, extubated, and returned to the recovery room in satisfactory condition after tolerating the procedure well.

## 2022-05-29 NOTE — Anesthesia Procedure Notes (Signed)
Procedure Name: Intubation Date/Time: 05/29/2022 9:45 AM  Performed by: Fredderick Phenix, CRNAPre-anesthesia Checklist: Patient identified, Emergency Drugs available, Suction available and Patient being monitored Patient Re-evaluated:Patient Re-evaluated prior to induction Oxygen Delivery Method: Circle system utilized Preoxygenation: Pre-oxygenation with 100% oxygen Induction Type: IV induction Ventilation: Mask ventilation without difficulty LMA Size: 4.0 Tube type: Oral Number of attempts: 1 Airway Equipment and Method: Oral airway Placement Confirmation: positive ETCO2 and breath sounds checked- equal and bilateral Tube secured with: Tape Dental Injury: Teeth and Oropharynx as per pre-operative assessment

## 2022-05-29 NOTE — Anesthesia Preprocedure Evaluation (Signed)
Anesthesia Evaluation  Patient identified by MRN, date of birth, ID band Patient awake    Reviewed: Allergy & Precautions, NPO status , Patient's Chart, lab work & pertinent test results  History of Anesthesia Complications Negative for: history of anesthetic complications  Airway Mallampati: II  TM Distance: >3 FB Neck ROM: Full    Dental no notable dental hx. (+) Caps, Teeth Intact   Pulmonary neg pulmonary ROS, neg sleep apnea, neg COPD, Patient abstained from smoking.Not current smoker   Pulmonary exam normal breath sounds clear to auscultation       Cardiovascular Exercise Tolerance: Good METS(-) hypertension(-) CAD and (-) Past MI negative cardio ROS (-) dysrhythmias  Rhythm:Regular Rate:Normal - Systolic murmurs    Neuro/Psych  Headaches  negative psych ROS   GI/Hepatic ,GERD  Medicated and Controlled,,(+)     (-) substance abuse    Endo/Other  diabetes, Type 2, Oral Hypoglycemic Agents    Renal/GU CRFRenal disease     Musculoskeletal   Abdominal  (+) + obese  Peds  Hematology   Anesthesia Other Findings Past Medical History: No date: Anemia No date: Arthritis No date: Chronic pain syndrome No date: Chronic sciatica No date: Diabetes mellitus without complication (HCC) No date: Family history of adverse reaction to anesthesia     Comment:  daughter hard to wake up No date: GERD (gastroesophageal reflux disease) No date: Headache No date: Lumbar radiculopathy No date: Sciatica     Comment:  Workers Comp  Reproductive/Obstetrics                              Anesthesia Physical Anesthesia Plan  ASA: 2  Anesthesia Plan: General   Post-op Pain Management:    Induction: Intravenous  PONV Risk Score and Plan: 3 and Ondansetron, Dexamethasone and Midazolam  Airway Management Planned: LMA  Additional Equipment: None  Intra-op Plan:   Post-operative Plan: Extubation  in OR  Informed Consent: I have reviewed the patients History and Physical, chart, labs and discussed the procedure including the risks, benefits and alternatives for the proposed anesthesia with the patient or authorized representative who has indicated his/her understanding and acceptance.     Dental advisory given  Plan Discussed with: CRNA and Surgeon  Anesthesia Plan Comments: (Discussed risks of anesthesia with patient, including PONV, sore throat, lip/dental/eye damage. Rare risks discussed as well, such as cardiorespiratory and neurological sequelae, and allergic reactions. Discussed the role of CRNA in patient's perioperative care. Patient understands.)         Anesthesia Quick Evaluation

## 2022-05-29 NOTE — Transfer of Care (Signed)
Immediate Anesthesia Transfer of Care Note  Patient: Ramyiah Neyland  Procedure(s) Performed: CARPAL TUNNEL RELEASE ENDOSCOPIC (Right: Wrist)  Patient Location: PACU  Anesthesia Type:General  Level of Consciousness: awake and alert   Airway & Oxygen Therapy: Patient Spontanous Breathing and Patient connected to face mask oxygen  Post-op Assessment: Report given to RN and Post -op Vital signs reviewed and stable  Post vital signs: Reviewed and stable  Last Vitals:  Vitals Value Taken Time  BP 115/70 05/29/22 1026  Temp    Pulse 94 05/29/22 1027  Resp 18 05/29/22 1027  SpO2 100 % 05/29/22 1027  Vitals shown include unvalidated device data.  Last Pain:  Vitals:   05/29/22 0901  PainSc: 2          Complications: No notable events documented.

## 2022-05-30 ENCOUNTER — Encounter: Payer: Self-pay | Admitting: Surgery

## 2022-09-17 ENCOUNTER — Emergency Department
Admission: EM | Admit: 2022-09-17 | Discharge: 2022-09-17 | Disposition: A | Discharge status: Home / Self Care | Payer: Medicare HMO | Attending: Emergency Medicine | Admitting: Emergency Medicine

## 2022-09-17 ENCOUNTER — Other Ambulatory Visit: Payer: Self-pay

## 2022-09-17 ENCOUNTER — Emergency Department: Payer: Medicare HMO

## 2022-09-17 DIAGNOSIS — E119 Type 2 diabetes mellitus without complications: Secondary | ICD-10-CM | POA: Insufficient documentation

## 2022-09-17 DIAGNOSIS — R9431 Abnormal electrocardiogram [ECG] [EKG]: Secondary | ICD-10-CM

## 2022-09-17 DIAGNOSIS — R531 Weakness: Secondary | ICD-10-CM | POA: Diagnosis present

## 2022-09-17 DIAGNOSIS — R42 Dizziness and giddiness: Secondary | ICD-10-CM | POA: Insufficient documentation

## 2022-09-17 LAB — URINALYSIS, ROUTINE W REFLEX MICROSCOPIC
Bacteria, UA: NONE SEEN
Bilirubin Urine: NEGATIVE
Glucose, UA: >=500 mg/dL — AB
Hgb urine dipstick: NEGATIVE
Ketones, ur: NEGATIVE mg/dL
Leukocytes,Ua: NEGATIVE
Nitrite: NEGATIVE
Protein, ur: NEGATIVE mg/dL
Specific Gravity, Urine: 1.021 (ref 1.005–1.030)
Squamous Epithelial / HPF: NONE SEEN /HPF (ref 0–5)
pH: 5 (ref 5.0–8.0)

## 2022-09-17 LAB — CBC WITH DIFFERENTIAL/PLATELET
Abs Immature Granulocytes: 0.03 10*3/uL (ref 0.00–0.07)
Basophils Absolute: 0 10*3/uL (ref 0.0–0.1)
Basophils Relative: 0 %
Eosinophils Absolute: 0.3 10*3/uL (ref 0.0–0.5)
Eosinophils Relative: 4 %
HCT: 34.9 % — ABNORMAL LOW (ref 36.0–46.0)
Hemoglobin: 11 g/dL — ABNORMAL LOW (ref 12.0–15.0)
Immature Granulocytes: 0 %
Lymphocytes Relative: 39 %
Lymphs Abs: 3.1 10*3/uL (ref 0.7–4.0)
MCH: 29.3 pg (ref 26.0–34.0)
MCHC: 31.5 g/dL (ref 30.0–36.0)
MCV: 92.8 fL (ref 80.0–100.0)
Monocytes Absolute: 0.6 10*3/uL (ref 0.1–1.0)
Monocytes Relative: 7 %
Neutro Abs: 3.9 10*3/uL (ref 1.7–7.7)
Neutrophils Relative %: 50 %
Platelets: 261 10*3/uL (ref 150–400)
RBC: 3.76 MIL/uL — ABNORMAL LOW (ref 3.87–5.11)
RDW: 13.8 % (ref 11.5–15.5)
WBC: 8 10*3/uL (ref 4.0–10.5)
nRBC: 0 % (ref 0.0–0.2)

## 2022-09-17 LAB — COMPREHENSIVE METABOLIC PANEL
ALT: 19 U/L (ref 0–44)
AST: 18 U/L (ref 15–41)
Albumin: 4.2 g/dL (ref 3.5–5.0)
Alkaline Phosphatase: 71 U/L (ref 38–126)
Anion gap: 8 (ref 5–15)
BUN: 23 mg/dL (ref 8–23)
CO2: 26 mmol/L (ref 22–32)
Calcium: 9.3 mg/dL (ref 8.9–10.3)
Chloride: 106 mmol/L (ref 98–111)
Creatinine, Ser: 1.42 mg/dL — ABNORMAL HIGH (ref 0.44–1.00)
GFR, Estimated: 42 mL/min — ABNORMAL LOW (ref >60)
Glucose, Bld: 146 mg/dL — ABNORMAL HIGH (ref 70–99)
Potassium: 4.3 mmol/L (ref 3.5–5.1)
Sodium: 140 mmol/L (ref 135–145)
Total Bilirubin: 0.6 mg/dL (ref 0.3–1.2)
Total Protein: 8 g/dL (ref 6.5–8.1)

## 2022-09-17 LAB — TROPONIN I (HIGH SENSITIVITY)
Troponin I (High Sensitivity): 5 ng/L (ref <18)
Troponin I (High Sensitivity): 6 ng/L (ref <18)

## 2022-09-17 MED ORDER — SODIUM CHLORIDE 0.9 % IV BOLUS
1000.0000 mL | Freq: Once | INTRAVENOUS | Status: AC
  Administered 2022-09-17: 1000 mL via INTRAVENOUS

## 2022-09-17 NOTE — ED Notes (Signed)
Patient provided box meal with apple juice.

## 2022-09-17 NOTE — ED Provider Notes (Signed)
[facility] Provider Note    Event Date/Time   First MD Initiated Contact with Patient 09/17/22 1435     (approximate)   History   Weakness and Chest Pain   HPI  Meghan Vasquez is a 62 y.o. female with history of T2DM presenting to the emergency department for evaluation of weakness.  Patient reports that this morning around 11 AM she had onset of generalized weakness with associated lightheadedness.  Around 1 PM she was at her doctor's office to get a refill on her medicine when she had worsening lightheadedness and felt like she was going to pass out.  EMS was called.  With EMS she was found to have an abnormal EKG with ST elevation in the anterior leads.  I did review this with the EMS team.  Patient has denied chest pain throughout this time, so she was not activated as a code STEMI prehospital.  On my evaluation, patient continues to deny chest pain.  She reports some mild shortness of breath.      Physical Exam   Triage Vital Signs: ED Triage Vitals  Enc Vitals Group     BP 09/17/22 1450 (!) 144/75     Pulse Rate 09/17/22 1450 79     Resp 09/17/22 1450 16     Temp 09/17/22 1450 98.1 F (36.7 C)     Temp Source 09/17/22 1450 Oral     SpO2 09/17/22 1450 100 %     Weight 09/17/22 1450 215 lb 13.3 oz (97.9 kg)     Height 09/17/22 1450 5\' 3"  (1.6 m)     Head Circumference --      Peak Flow --      Pain Score 09/17/22 1447 0     Pain Loc --      Pain Edu? --      Excl. in GC? --     Most recent vital signs: Vitals:   09/17/22 1450 09/17/22 1530  BP: (!) 144/75 (!) 151/80  Pulse: 79 81  Resp: 16 17  Temp: 98.1 F (36.7 C)   SpO2: 100% 95%     General: Awake, interactive  CV:  Regular rate, good peripheral perfusion.  Resp:  Lungs clear, unlabored respirations.  Abd:  Soft, nondistended.  Neuro:  Symmetric facial movement, fluid speech   ED Results / Procedures / Treatments   Labs (all labs ordered are listed, but only abnormal  results are displayed) Labs Reviewed  COMPREHENSIVE METABOLIC PANEL - Abnormal; Notable for the following components:      Result Value   Glucose, Bld 146 (*)    Creatinine, Ser 1.42 (*)    GFR, Estimated 42 (*)    All other components within normal limits  CBC WITH DIFFERENTIAL/PLATELET - Abnormal; Notable for the following components:   RBC 3.76 (*)    Hemoglobin 11.0 (*)    HCT 34.9 (*)    All other components within normal limits  URINALYSIS, ROUTINE W REFLEX MICROSCOPIC - Abnormal; Notable for the following components:   Color, Urine STRAW (*)    APPearance CLEAR (*)    Glucose, UA >=500 (*)    All other components within normal limits  TROPONIN I (HIGH SENSITIVITY)     EKG EKG independently reviewed interpreted by myself (ER attending) demonstrates:  EKG demonstrates sinus rhythm at a rate of 83, PR 159, QRS 73, QTc 430, RS are prime morphology noted as well as borderline ST elevation in the anterior leads.  RADIOLOGY Imaging independently reviewed and interpreted by myself demonstrates:    PROCEDURES:  Critical Care performed: No  Procedures   MEDICATIONS ORDERED IN ED: Medications  sodium chloride 0.9 % bolus 1,000 mL (1,000 mLs Intravenous New Bag/Given 09/17/22 1456)     IMPRESSION / MDM / ASSESSMENT AND PLAN / ED COURSE  I reviewed the triage vital signs and the nursing notes.  Differential diagnosis includes, but is not limited to, anemia, electrolyte abnormality,  Patient's presentation is most consistent with acute presentation with potential threat to life or bodily function.  62 year old female presenting with weakness and lightheadedness without syncope.  No chest pain.  EKG does demonstrate new ST elevation in leads V1 and V2.  Will discuss with STEMI physician but hold off on code STEMI activation for now given absence of ischemic symptoms.  Clinical Course as of 09/17/22 1612  Mon Sep 17, 2022  1505 Case discussed with Dr. Kirke Corin, STEMI on-call  physician.  He reviewed the patient's EKG.  Felt that the morphology was not typical of an acute MI and with patient not having chest pain, did not recommend STEMI activation.  Did recommend continued workup including cardiac enzymes to determine if disposition. [NR]  1611 Initial workup demonstrates stable anemia, slightly increased creatinine at 1.42.  UA without evidence of infection.  Initial troponin negative. Chest x-Romain Erion without acute findings.  With concerning EKG, will plan for repeat troponin.  Signed out to oncoming provider pending repeat troponin, reevaluation, disposition. [NR]    Clinical Course User Index [NR] Trinna Post, MD     FINAL CLINICAL IMPRESSION(S) / ED DIAGNOSES   Final diagnoses:  Lightheadedness  Generalized weakness     Rx / DC Orders   ED Discharge Orders     None        Note:  This document was prepared using Dragon voice recognition software and may include unintentional dictation errors.   Trinna Post, MD 09/17/22 [phone]

## 2022-09-17 NOTE — ED Provider Notes (Signed)
Emergency department handoff note  Care of this patient was signed out to me at the end of the previous provider shift.  All pertinent patient information was conveyed and all questions were answered.  Patient pending repeat troponin which was negative for any acute abnormalities.  Patient has not had any chest pain during her emergency department course further episodes of generalized weakness The patient has been reexamined and is ready to be discharged.  All diagnostic results have been reviewed and discussed with the patient/family.  Care plan has been outlined and the patient/family understands all current diagnoses, results, and treatment plans.  There are no new complaints, changes, or physical findings at this time.  All questions have been addressed and answered.  Patient was instructed to, and agrees to follow-up with their primary care physician as well as return to the emergency department if any new or worsening symptoms develop.   Merwyn Katos, MD 09/17/22 [phone]

## 2022-09-17 NOTE — ED Triage Notes (Signed)
Pt here via [facility] with weakness. Pt went for a follow up with her provider and began feeling weak at 11a. Pt had a near syncope episode in the office and she was not able to get her words out. Pt does not have cp but was showing STEMI on ems ECG. Pt not able to stand on arrival to ED. 324 mg asa given by ems. 18G RAC.    130-cbg 124/90 87 97% RA

## 2022-09-17 NOTE — ED Notes (Signed)
Patient ambulated to toilet in room with x1 assist.

## 2022-09-21 ENCOUNTER — Encounter: Payer: Self-pay | Admitting: *Deleted

## 2022-09-21 ENCOUNTER — Encounter: Payer: Self-pay | Admitting: Cardiovascular Disease

## 2022-09-21 ENCOUNTER — Ambulatory Visit: Payer: Medicare HMO | Attending: Cardiovascular Disease | Admitting: Cardiovascular Disease

## 2022-09-21 VITALS — BP 128/82 | HR 90 | Ht 63.0 in | Wt 217.1 lb

## 2022-09-21 DIAGNOSIS — R0609 Other forms of dyspnea: Secondary | ICD-10-CM

## 2022-09-21 DIAGNOSIS — E785 Hyperlipidemia, unspecified: Secondary | ICD-10-CM

## 2022-09-21 DIAGNOSIS — R9431 Abnormal electrocardiogram [ECG] [EKG]: Secondary | ICD-10-CM | POA: Diagnosis not present

## 2022-09-21 NOTE — Progress Notes (Signed)
Cardiology Office Note   Date:  09/21/2022   ID:  Meghan Vasquez, DOB 07-19-1960, MRN [id number]  PCP:  Emogene Morgan, MD  Cardiologist:   Lorine Bears, MD   Chief Complaint  Patient presents with   New Patient (Initial Visit)    ABN EKG finding c/o sob and left arm weakness/soreness/pain. Meds reviewed verbally with pt.      History of Present Illness: Meghan Vasquez is a 62 y.o. female who was referred from [facility] ED for evaluation of an abnormal EKG. She has known history of type 2 diabetes, GERD, hyperlipidemia and neuropathy. She recently went to her primary care physician for medications refills but became acutely ill with generalized weakness.  She was noted to have an abnormal EKG with slight ST elevation in V1 and V2 and thus she was transferred to the ED by EMS.   Her EKG was concerning for anterior STEMI but she had no chest pain and her presentation was not consistent with acute coronary syndrome.  Her troponin were found to be normal.  Labs showed slightly elevated creatinine at 1.4 with stable anemia.  Hemoglobin was 11.  She improved with IV hydration.  She reports no chest pain.  She does complain of exertional dyspnea and also left shoulder and arm discomfort that is not exertional.  She has cramps in her legs but her distal pulses are normal.    Past Medical History:  Diagnosis Date   Anemia    Arthritis    Chronic pain syndrome    Chronic sciatica    Diabetes mellitus without complication (HCC)    Family history of adverse reaction to anesthesia    daughter hard to wake up   GERD (gastroesophageal reflux disease)    Headache    Lumbar radiculopathy    Sciatica    Workers Comp    Past Surgical History:  Procedure Laterality Date   CARPAL TUNNEL RELEASE Right 05/29/2022   Procedure: CARPAL TUNNEL RELEASE ENDOSCOPIC;  Surgeon: Christena Flake, MD;  Location: [facility] ORS;  Service: Orthopedics;  Laterality: Right;   COLONOSCOPY  2013   COLONOSCOPY WITH PROPOFOL  N/A 06/18/2019   Procedure: COLONOSCOPY WITH PROPOFOL;  Surgeon: Wyline Mood, MD;  Location: [facility] ENDOSCOPY;  Service: Gastroenterology;  Laterality: N/A;   etopic pregnancy     FLEXIBLE SIGMOIDOSCOPY N/A 06/24/2019   Procedure: FLEXIBLE SIGMOIDOSCOPY;  Surgeon: Wyline Mood, MD;  Location: [facility] ENDOSCOPY;  Service: Gastroenterology;  Laterality: N/A;  NON-SEDATED     Current Outpatient Medications  Medication Sig Dispense Refill   acetaminophen (TYLENOL) 650 MG CR tablet Take 650-1,300 mg by mouth every 8 (eight) hours as needed for pain.     Ascorbic Acid (VITAMIN C PO) Take 1 tablet by mouth daily.     aspirin EC 81 MG tablet Take 81 mg by mouth daily. Swallow whole.     atorvastatin (LIPITOR) 10 MG tablet Take 10 mg by mouth daily.     Calcium-Magnesium-Vitamin D (CALCIUM MAGNESIUM PO) Take 1 tablet by mouth daily.     Cyanocobalamin (VITAMIN B-12 PO) Take 1 tablet by mouth daily.     cyclobenzaprine (FLEXERIL) 10 MG tablet Take 10 mg by mouth 2 (two) times daily.     gabapentin (NEURONTIN) 300 MG capsule Take 100 mg by mouth See admin instructions. 2x daily M-F 3x daily on weekend only     Glucose Blood (GLUCOSE METER TEST VI) as directed.     glucose blood test strip 1 each  by Other route in the morning, at noon, and at bedtime.     JARDIANCE 10 MG TABS tablet Take 10 mg by mouth daily.     Lancets 33G MISC 1 Lancet by Does not apply route daily.     lisinopril (ZESTRIL) 2.5 MG tablet Take 2.5 mg by mouth daily.     meclizine (ANTIVERT) 25 MG tablet Take 12.25 mg by mouth every 6 (six) hours as needed for dizziness.     Misc. Devices MISC by Does not apply route. Walk-in tub Massage chair     pantoprazole (PROTONIX) 40 MG tablet Take 40 mg by mouth daily as needed.     VITAMIN E PO Take 1 tablet by mouth daily.     traMADol (ULTRAM) 50 MG tablet Take 1 tablet (50 mg total) by mouth every 6 (six) hours as needed for severe pain. (Patient taking differently: Take 50 mg by mouth 2 (two)  times daily as needed for severe pain.) 120 tablet 0   No current facility-administered medications for this visit.    Allergies:   Patient has no known allergies.    Social History:  The patient  reports that she has never smoked. She has never used smokeless tobacco. She reports that she does not drink alcohol and does not use drugs.   Family History:  The patient's family history includes Aplastic anemia in her daughter; Breast cancer in her daughter; Cancer in her daughter; Diabetes in her mother; Emphysema in her brother; Kidney disease in her daughter; Other in her daughter.    ROS:  Please see the history of present illness.   Otherwise, review of systems are positive for none.   All other systems are reviewed and negative.    PHYSICAL EXAM: VS:  BP 128/82 (BP Location: Right Arm, Patient Position: Sitting, Cuff Size: Large)   Pulse 90   Ht 5\' 3"  (1.6 m)   Wt 217 lb 2 oz (98.5 kg)   LMP 06/20/2017   SpO2 98%   BMI 38.46 kg/m  , BMI Body mass index is 38.46 kg/m. GEN: Well nourished, well developed, in no acute distress  HEENT: normal  Neck: no JVD, carotid bruits, or masses Cardiac: RRR; no murmurs, rubs, or gallops,no edema  Respiratory:  clear to auscultation bilaterally, normal work of breathing GI: soft, nontender, nondistended, + BS MS: no deformity or atrophy  Skin: warm and dry, no rash Neuro:  Strength and sensation are intact Psych: euthymic mood, full affect   EKG:  EKG is ordered today. The ekg ordered today demonstrates normal sinus rhythm with no significant ST or T wave changes.  No evidence of prior infarct.   Recent Labs: 09/17/2022: ALT 19; BUN 23; Creatinine, Ser 1.42; Hemoglobin 11.0; Platelets 261; Potassium 4.3; Sodium 140    Lipid Panel    Component Value Date/Time   CHOL CANCELED 07/01/2015 0951   TRIG CANCELED 07/01/2015 0951      Wt Readings from Last 3 Encounters:  09/21/22 217 lb 2 oz (98.5 kg)  09/17/22 215 lb 13.3 oz (97.9 kg)   05/29/22 215 lb 13.3 oz (97.9 kg)          09/21/2022    9:48 AM  PAD Screen  Previous PAD dx? No  Previous surgical procedure? Yes  Pain with walking? Yes  Subsides with rest? No  Feet/toe relief with dangling? No  Painful, non-healing ulcers? No  Extremities discolored? No      ASSESSMENT AND PLAN:  1.  Exertional dyspnea without chest pain: She has multiple risk factors for ischemic heart disease including type 2 diabetes.  She had recent presentation with acute weakness and slightly abnormal EKG with borderline ST elevation in V1 and V2.  I reviewed his EKGs and I suspect that the changes were related to incomplete right bundle branch block.  Her EKG is normal today.  I recommend evaluation with a Lexiscan Myoview.  She is not able to exercise on a treadmill.  Will also obtain an echocardiogram to evaluate her systolic and diastolic function.  2.  Hyperlipidemia: Currently on atorvastatin 10 mg daily.  Recommended target LDL of less than 70 given that she is diabetic.    Disposition:   FU as needed if cardiac testing is abnormal.  Signed,  Lorine Bears, MD  09/21/2022 11:55 AM    [facility]

## 2022-09-21 NOTE — Patient Instructions (Signed)
Medication Instructions:  No changes *If you need a refill on your cardiac medications before your next appointment, please call your pharmacy*   Lab Work: None ordered If you have labs (blood work) drawn today and your tests are completely normal, you will receive your results only by: MyChart Message (if you have MyChart) OR A paper copy in the mail If you have any lab test that is abnormal or we need to change your treatment, we will call you to review the results.   Testing/Procedures: Your physician has requested that you have an echocardiogram. Echocardiography is a painless test that uses sound waves to create images of your heart. It provides your doctor with information about the size and shape of your heart and how well your heart's chambers and valves are working.   You may receive an ultrasound enhancing agent through an IV if needed to better visualize your heart during the echo. This procedure takes approximately one hour.  There are no restrictions for this procedure.  This will take place at [address] [address], [place] [address]    Follow-Up: At [facility], you and your health needs are our priority.  As part of our continuing mission to provide you with exceptional heart care, we have created designated Provider Care Teams.  These Care Teams include your primary Cardiologist (physician) and Advanced Practice Providers (APPs -  Physician Assistants and Nurse Practitioners) who all work together to provide you with the care you need, when you need it.  We recommend signing up for the patient portal called "MyChart".  Sign up information is provided on this After Visit Summary.  MyChart is used to connect with patients for Virtual Visits (Telemedicine).  Patients are able to view lab/test results, encounter notes, upcoming appointments, etc.  Non-urgent messages can be sent to your provider as well.   To learn more about what you can  do with MyChart, go to [email or web address].    Your next appointment:   Follow up as needed with Dr. Kirke Corin  Other Instructions Your provider has ordered a Lexiscan Myoview Stress test. This will take place at [facility]. Please report to the [facility] medical mall entrance. The volunteers at the first desk will direct you where to go.  [facility] MYOVIEW  Your provider has ordered a Stress Test with nuclear imaging. The purpose of this test is to evaluate the blood supply to your heart muscle. This procedure is referred to as a "Non-Invasive Stress Test." This is because other than having an IV started in your vein, nothing is inserted or "invades" your body. Cardiac stress tests are done to find areas of poor blood flow to the heart by determining the extent of coronary artery disease (CAD). Some patients exercise on a treadmill, which naturally increases the blood flow to your heart, while others who are unable to walk on a treadmill due to physical limitations will have a pharmacologic/chemical stress agent called Lexiscan . This medicine will mimic walking on a treadmill by temporarily increasing your coronary blood flow.   Please note: these test may take anywhere between 2-4 hours to complete  How to prepare for your Myoview test:  Nothing to eat for 6 hours prior to the test No caffeine for 24 hours prior to test No smoking 24 hours prior to test. Your medication may be taken with water.  If your doctor stopped a medication because of this test, do not take that medication. Ladies, please do not  wear dresses.  Skirts or pants are appropriate. Please wear a short sleeve shirt. No perfume, cologne or lotion. Wear comfortable walking shoes. No heels!   PLEASE NOTIFY THE OFFICE AT LEAST 24 HOURS IN ADVANCE IF YOU ARE UNABLE TO KEEP YOUR APPOINTMENT.  [phone] AND  PLEASE NOTIFY NUCLEAR MEDICINE AT [facility] AT LEAST 24 HOURS IN ADVANCE IF YOU ARE UNABLE TO KEEP YOUR APPOINTMENT. [phone]

## 2022-09-26 NOTE — Addendum Note (Signed)
Addended by: Iverson Alamin C on: 09/26/2022 11:10 AM   Modules accepted: Orders

## 2022-09-27 ENCOUNTER — Ambulatory Visit: Payer: Medicare HMO | Attending: Cardiovascular Disease

## 2022-09-27 DIAGNOSIS — R0609 Other forms of dyspnea: Secondary | ICD-10-CM | POA: Diagnosis not present

## 2022-09-27 LAB — ECHOCARDIOGRAM COMPLETE
Area-P 1/2: 4.39 cm2
S' Lateral: 2.7 cm

## 2022-09-28 ENCOUNTER — Other Ambulatory Visit: Payer: Medicare HMO

## 2022-09-28 ENCOUNTER — Ambulatory Visit
Admission: RE | Admit: 2022-09-28 | Discharge: 2022-09-28 | Disposition: A | Discharge status: Home / Self Care | Payer: Medicare HMO | Source: Ambulatory Visit | Attending: Cardiovascular Disease | Admitting: Cardiovascular Disease

## 2022-09-28 DIAGNOSIS — R0609 Other forms of dyspnea: Secondary | ICD-10-CM | POA: Diagnosis present

## 2022-09-28 LAB — NM MYOCAR MULTI W/SPECT W/WALL MOTION / EF
Estimated workload: 1
Exercise duration (min): 0 min
Exercise duration (sec): 0 s
LV dias vol: 67 mL (ref 46–106)
LV sys vol: 29 mL
MPHR: 159 {beats}/min
Nuc Stress EF: 57 %
Peak HR: 111 {beats}/min
Percent HR: 69 %
Rest BP: 14282 mmHg
Rest HR: 83 {beats}/min
Rest Nuclear Isotope Dose: 10.6 mCi
SDS: 0
SRS: 2
SSS: 2
ST Depression (mm): 0 mm
Stress Nuclear Isotope Dose: 31 mCi
TID: 0.91

## 2022-09-28 MED ORDER — REGADENOSON 0.4 MG/5ML IV SOLN
0.4000 mg | Freq: Once | INTRAVENOUS | Status: AC
  Administered 2022-09-28: 0.4 mg via INTRAVENOUS
  Filled 2022-09-28: qty 5

## 2022-09-28 MED ORDER — TECHNETIUM TC 99M TETROFOSMIN IV KIT
10.0000 | PACK | Freq: Once | INTRAVENOUS | Status: AC | PRN
  Administered 2022-09-28: 10.58 via INTRAVENOUS

## 2022-09-28 MED ORDER — TECHNETIUM TC 99M TETROFOSMIN IV KIT
30.0000 | PACK | Freq: Once | INTRAVENOUS | Status: AC | PRN
  Administered 2022-09-28: 31.03 via INTRAVENOUS

## 2023-01-29 ENCOUNTER — Ambulatory Visit: Payer: Medicare HMO | Admitting: Neurosurgery

## 2023-01-29 ENCOUNTER — Encounter: Payer: Self-pay | Admitting: Neurosurgery

## 2023-01-29 VITALS — BP 130/78 | Ht 63.0 in | Wt 214.0 lb

## 2023-01-29 DIAGNOSIS — M62838 Other muscle spasm: Secondary | ICD-10-CM

## 2023-01-29 DIAGNOSIS — G609 Hereditary and idiopathic neuropathy, unspecified: Secondary | ICD-10-CM

## 2023-01-29 DIAGNOSIS — M47816 Spondylosis without myelopathy or radiculopathy, lumbar region: Secondary | ICD-10-CM

## 2023-01-29 NOTE — Progress Notes (Signed)
Referring Physician:  Emogene Morgan, MD [address] [place],  [place] [address]  Primary Physician:  Emogene Morgan, MD  History of Present Illness: 01/29/2023 [name] is here today with a chief complaint of ongoing back pain with cramping in her legs bilaterally. She also notes visible muscle spasms in her bilateral lower extremities particularly at night.  She also endorses a sensation as though her legs are cold.  She reports worsening low back pain particularly with colder weather however this does not necessarily correlate with her leg symptoms. Her symptoms really are unchanged from her last visit.   Conservative measures:  Physical therapy: currently in water aerobics at the [facility]  Multimodal medical therapy including regular antiinflammatories: tramadol, Tylenol, Gabapentin  Injections:multiple in the past with little improvement.  Past Surgery: none  Meghan Vasquez has no symptoms of cervical myelopathy.  LOV 04/20/22 Dr. Myer Haff 04/20/2022 Telephone call today to discuss MRI findings.   04/02/2022 [name] is here today with a chief complaint of low back pain and difficulty with her legs feeling weak.  She has had her legs give out multiple times.  She has trouble walking more than 50 or 100 feet.  She recently was at [facility] and had her legs give out causing her to fall.  She has had 2 different EMGs 1 of which gave concern for an upper motor neuron issue.  She describes numbness throughout her legs bilaterally.   She does not have clearly radicular symptoms, but does have numbness in her hands and discomfort in her forearms.  She is having some dexterity issues.  She is also having balance issues.  She has been walking with a cane for several years.  Prolonged standing and walking make it worse.  Sitting in a massage chair help.  She is currently doing home health PT.   low back pain that radiates into the bilateral legs Bilateral leg weakness - her  legs have given out at times Review EMG results, report scanned under media tab Review of Systems:  A 10 point review of systems is negative, except for the pertinent positives and negatives detailed in the HPI.  Past Medical History: Past Medical History:  Diagnosis Date   Anemia    Arthritis    Chronic pain syndrome    Chronic sciatica    Diabetes mellitus without complication (HCC)    Family history of adverse reaction to anesthesia    daughter hard to wake up   GERD (gastroesophageal reflux disease)    Headache    Lumbar radiculopathy    Sciatica    Workers Comp    Past Surgical History: Past Surgical History:  Procedure Laterality Date   CARPAL TUNNEL RELEASE Right 05/29/2022   Procedure: CARPAL TUNNEL RELEASE ENDOSCOPIC;  Surgeon: Christena Flake, MD;  Location: [facility] ORS;  Service: Orthopedics;  Laterality: Right;   COLONOSCOPY  2013   COLONOSCOPY WITH PROPOFOL N/A 06/18/2019   Procedure: COLONOSCOPY WITH PROPOFOL;  Surgeon: Wyline Mood, MD;  Location: [facility] ENDOSCOPY;  Service: Gastroenterology;  Laterality: N/A;   etopic pregnancy     FLEXIBLE SIGMOIDOSCOPY N/A 06/24/2019   Procedure: FLEXIBLE SIGMOIDOSCOPY;  Surgeon: Wyline Mood, MD;  Location: [facility] ENDOSCOPY;  Service: Gastroenterology;  Laterality: N/A;  NON-SEDATED    Allergies: Allergies as of 01/29/2023   (No Known Allergies)    Medications: Outpatient Encounter Medications as of 01/29/2023  Medication Sig   acetaminophen (TYLENOL) 650 MG CR tablet Take 650-1,300 mg  by mouth every 8 (eight) hours as needed for pain.   Ascorbic Acid (VITAMIN C PO) Take 1 tablet by mouth daily.   aspirin EC 81 MG tablet Take 81 mg by mouth daily. Swallow whole.   atorvastatin (LIPITOR) 10 MG tablet Take 10 mg by mouth daily.   Calcium-Magnesium-Vitamin D (CALCIUM MAGNESIUM PO) Take 1 tablet by mouth daily.   Cyanocobalamin (VITAMIN B-12 PO) Take 1 tablet by mouth daily.   cyclobenzaprine (FLEXERIL) 10 MG tablet Take 10 mg by  mouth 2 (two) times daily.   gabapentin (NEURONTIN) 300 MG capsule Take 100 mg by mouth See admin instructions. 2x daily M-F 3x daily on weekend only   Glucose Blood (GLUCOSE METER TEST VI) as directed.   glucose blood test strip 1 each by Other route in the morning, at noon, and at bedtime.   JARDIANCE 10 MG TABS tablet Take 10 mg by mouth daily.   Lancets 33G MISC 1 Lancet by Does not apply route daily.   lisinopril (ZESTRIL) 2.5 MG tablet Take 2.5 mg by mouth daily.   meclizine (ANTIVERT) 25 MG tablet Take 12.25 mg by mouth every 6 (six) hours as needed for dizziness.   Misc. Devices MISC by Does not apply route. Walk-in tub Massage chair   pantoprazole (PROTONIX) 40 MG tablet Take 40 mg by mouth daily as needed.   VITAMIN E PO Take 1 tablet by mouth daily.   traMADol (ULTRAM) 50 MG tablet Take 1 tablet (50 mg total) by mouth every 6 (six) hours as needed for severe pain. (Patient taking differently: Take 50 mg by mouth 2 (two) times daily as needed for severe pain.)   No facility-administered encounter medications on file as of 01/29/2023.    Social History: Social History   Tobacco Use   Smoking status: Never   Smokeless tobacco: Never  Vaping Use   Vaping status: Never Used  Substance Use Topics   Alcohol use: No   Drug use: No    Family Medical History: Family History  Problem Relation Age of Onset   Diabetes Mother    Emphysema Brother    Other Daughter        Scarcoidosis   Aplastic anemia Daughter    Kidney disease Daughter    Cancer Daughter        Breast Bilateral Mastectomy age 69, kidney    Breast cancer Daughter     Physical Examination: Today's Vitals   01/29/23 1440  BP: 130/78  Weight: 97.1 kg  Height: 5\' 3"  (1.6 m)  PainSc: 4   PainLoc: Leg   Body mass index is 37.91 kg/m.   General: Patient is well developed, well nourished, calm, collected, and in no apparent distress. Attention to examination is appropriate.  Psychiatric: Patient is  non-anxious.  Head:  Pupils equal, round, and reactive to light.  ENT:  Oral mucosa appears well hydrated.  Neck:   Supple.   Respiratory: Patient is breathing without any difficulty.  Extremities: No edema.  Vascular: Palpable dorsal pedal pulses.  Skin:   On exposed skin, there are no abnormal skin lesions.  NEUROLOGICAL:     Awake, alert, oriented to person, place, and time.  Speech is clear and fluent. Fund of knowledge is appropriate.   Cranial Nerves: Pupils equal round and reactive to light.  Facial tone is symmetric.  Facial sensation is symmetric.  ROM of spine: full.  Palpation of spine: non tender.    Strength: Side Biceps Triceps Deltoid Interossei Grip  Wrist Ext. Wrist Flex.  R 5 5 5 5 5 5 5   L 5 5 5 5 5 5 5    Side Iliopsoas Quads Hamstring PF DF EHL  R 5 5 5 5 5 5   L 5 5 5 5 5 5    Reflexes are 1+ and symmetric at the biceps, triceps, brachioradialis, patella and achilles.   Hoffman's is absent.  Clonus is not present.  Toes are down-going.  Bilateral upper and lower extremity sensation is intact to light touch.    Ambulates with the assistance of a cane  Medical Decision Making  Imaging: MRI L spine 03/05/22 MPRESSION: 1. L2-3: Facet osteoarthritis right more than left with small joint effusion. No compressive stenosis. The facet arthritis could be painful. 2. L3-4: Mild disc bulge. Mild facet and ligamentous hypertrophy. No compressive stenosis. 3. L4-5: Facet osteoarthritis with 1-2 mm of anterolisthesis. Bulging of the disc more towards the right. Mild proximal foraminal encroachment on the right could possibly affect the right L4 nerve. The facet arthritis could be painful. 4. L5-S1: Disc bulge. Facet osteoarthritis. No compressive stenosis. The facet arthritis could be painful.     Electronically Signed   By: Paulina Fusi M.D.   On: 03/06/2022 11:15  I have personally reviewed the images and agree with the above  interpretation.  Assessment and Plan: Meghan Vasquez is a pleasant 62 y.o. female with longstanding lumbosacral complaints.  While I do think her back pain is likely related to her facet arthropathy, she has failed to improve with multiple injections.  From our discussion it sounds like her back pain is largely managed with over-the-counter medications.  My primary concern is the muscle spasms she describes in her bilateral thighs and cramping sensations in her calf.  These seem to be worse at night but she states that they are visible in nature.  There is nothing on her MRI scans that explain the symptoms.  I would like to refer her to neurology for further evaluation as she has exhausted all other treatment options from a neurosurgical standpoint.  We discussed that there may be some underlying vascular reason for symptoms however she does not have any lower extremity discoloration and her pulses seem to be intact on my exam today.  I do not think that there is any role for neurosurgical intervention at this time.  Should she continue to have symptoms and have a largely negative neurology workup, it may be worthwhile to consider evaluation of a spinal cord stimulator.  I will see her going forward on an as-needed basis.  She expressed understanding and was in agreement with this plan.  Thank you for involving me in the care of this patient.   I spent a total of 46 minutes in both face-to-face and non-face-to-face activities for this visit on the date of this encounter including review of previous records, review of symptoms, review of imaging, discussion of differential diagnosis, physical exam, documentation, and order placement.   [facility]

## 2023-02-05 ENCOUNTER — Other Ambulatory Visit: Payer: Self-pay | Admitting: Family Medicine

## 2023-02-05 DIAGNOSIS — Z1231 Encounter for screening mammogram for malignant neoplasm of breast: Secondary | ICD-10-CM

## 2023-02-07 ENCOUNTER — Other Ambulatory Visit: Payer: Self-pay | Admitting: Family Medicine

## 2023-02-07 DIAGNOSIS — N6459 Other signs and symptoms in breast: Secondary | ICD-10-CM

## 2023-02-07 DIAGNOSIS — N63 Unspecified lump in unspecified breast: Secondary | ICD-10-CM

## 2023-02-18 ENCOUNTER — Ambulatory Visit
Admission: RE | Admit: 2023-02-18 | Discharge: 2023-02-18 | Disposition: A | Discharge status: Home / Self Care | Payer: Medicare HMO | Source: Ambulatory Visit | Attending: Family Medicine | Admitting: Family Medicine

## 2023-02-18 DIAGNOSIS — N6459 Other signs and symptoms in breast: Secondary | ICD-10-CM

## 2023-02-18 DIAGNOSIS — N63 Unspecified lump in unspecified breast: Secondary | ICD-10-CM | POA: Insufficient documentation

## 2023-08-16 ENCOUNTER — Observation Stay
Admission: EM | Admit: 2023-08-16 | Discharge: 2023-08-19 | Disposition: A | Discharge status: Home / Self Care | Attending: Internal Medicine | Admitting: Internal Medicine

## 2023-08-16 DIAGNOSIS — N179 Acute kidney failure, unspecified: Secondary | ICD-10-CM | POA: Diagnosis not present

## 2023-08-16 DIAGNOSIS — Z794 Long term (current) use of insulin: Secondary | ICD-10-CM | POA: Diagnosis not present

## 2023-08-16 DIAGNOSIS — N1831 Chronic kidney disease, stage 3a: Secondary | ICD-10-CM | POA: Diagnosis not present

## 2023-08-16 DIAGNOSIS — Z6838 Body mass index (BMI) 38.0-38.9, adult: Secondary | ICD-10-CM | POA: Insufficient documentation

## 2023-08-16 DIAGNOSIS — R42 Dizziness and giddiness: Secondary | ICD-10-CM

## 2023-08-16 DIAGNOSIS — Z1152 Encounter for screening for COVID-19: Secondary | ICD-10-CM | POA: Insufficient documentation

## 2023-08-16 DIAGNOSIS — G894 Chronic pain syndrome: Secondary | ICD-10-CM | POA: Diagnosis not present

## 2023-08-16 DIAGNOSIS — E669 Obesity, unspecified: Secondary | ICD-10-CM | POA: Diagnosis not present

## 2023-08-16 DIAGNOSIS — R531 Weakness: Principal | ICD-10-CM | POA: Insufficient documentation

## 2023-08-16 DIAGNOSIS — E1122 Type 2 diabetes mellitus with diabetic chronic kidney disease: Secondary | ICD-10-CM | POA: Diagnosis not present

## 2023-08-16 DIAGNOSIS — Z7901 Long term (current) use of anticoagulants: Secondary | ICD-10-CM | POA: Insufficient documentation

## 2023-08-16 DIAGNOSIS — Z79899 Other long term (current) drug therapy: Secondary | ICD-10-CM | POA: Insufficient documentation

## 2023-08-16 DIAGNOSIS — H5500 Unspecified nystagmus: Secondary | ICD-10-CM | POA: Diagnosis not present

## 2023-08-16 DIAGNOSIS — H8111 Benign paroxysmal vertigo, right ear: Secondary | ICD-10-CM | POA: Diagnosis not present

## 2023-08-16 DIAGNOSIS — I129 Hypertensive chronic kidney disease with stage 1 through stage 4 chronic kidney disease, or unspecified chronic kidney disease: Secondary | ICD-10-CM | POA: Diagnosis not present

## 2023-08-17 ENCOUNTER — Encounter: Payer: Self-pay | Admitting: *Deleted

## 2023-08-17 ENCOUNTER — Other Ambulatory Visit: Payer: Self-pay

## 2023-08-17 ENCOUNTER — Emergency Department

## 2023-08-17 ENCOUNTER — Observation Stay

## 2023-08-17 DIAGNOSIS — R531 Weakness: Secondary | ICD-10-CM | POA: Diagnosis not present

## 2023-08-17 DIAGNOSIS — R42 Dizziness and giddiness: Secondary | ICD-10-CM

## 2023-08-17 LAB — CBC WITH DIFFERENTIAL/PLATELET
Abs Immature Granulocytes: 0.03 10*3/uL (ref 0.00–0.07)
Basophils Absolute: 0 10*3/uL (ref 0.0–0.1)
Basophils Relative: 0 %
Eosinophils Absolute: 0.3 10*3/uL (ref 0.0–0.5)
Eosinophils Relative: 4 %
HCT: 35.3 % — ABNORMAL LOW (ref 36.0–46.0)
Hemoglobin: 11.2 g/dL — ABNORMAL LOW (ref 12.0–15.0)
Immature Granulocytes: 0 %
Lymphocytes Relative: 46 %
Lymphs Abs: 4.6 10*3/uL — ABNORMAL HIGH (ref 0.7–4.0)
MCH: 29 pg (ref 26.0–34.0)
MCHC: 31.7 g/dL (ref 30.0–36.0)
MCV: 91.5 fL (ref 80.0–100.0)
Monocytes Absolute: 0.6 10*3/uL (ref 0.1–1.0)
Monocytes Relative: 7 %
Neutro Abs: 4.2 10*3/uL (ref 1.7–7.7)
Neutrophils Relative %: 43 %
Platelets: 261 10*3/uL (ref 150–400)
RBC: 3.86 MIL/uL — ABNORMAL LOW (ref 3.87–5.11)
RDW: 14.4 % (ref 11.5–15.5)
Smear Review: NORMAL
WBC: 9.8 10*3/uL (ref 4.0–10.5)
nRBC: 0 % (ref 0.0–0.2)

## 2023-08-17 LAB — COMPREHENSIVE METABOLIC PANEL WITH GFR
ALT: 21 U/L (ref 0–44)
AST: 24 U/L (ref 15–41)
Albumin: 4 g/dL (ref 3.5–5.0)
Alkaline Phosphatase: 63 U/L (ref 38–126)
Anion gap: 8 (ref 5–15)
BUN: 24 mg/dL — ABNORMAL HIGH (ref 8–23)
CO2: 26 mmol/L (ref 22–32)
Calcium: 8.9 mg/dL (ref 8.9–10.3)
Chloride: 104 mmol/L (ref 98–111)
Creatinine, Ser: 1.69 mg/dL — ABNORMAL HIGH (ref 0.44–1.00)
GFR, Estimated: 34 mL/min — ABNORMAL LOW (ref >60)
Glucose, Bld: 142 mg/dL — ABNORMAL HIGH (ref 70–99)
Potassium: 3.9 mmol/L (ref 3.5–5.1)
Sodium: 138 mmol/L (ref 135–145)
Total Bilirubin: 0.4 mg/dL (ref 0.0–1.2)
Total Protein: 7.4 g/dL (ref 6.5–8.1)

## 2023-08-17 LAB — URINALYSIS, ROUTINE W REFLEX MICROSCOPIC
Bacteria, UA: NONE SEEN
Bilirubin Urine: NEGATIVE
Glucose, UA: >=500 mg/dL — AB
Hgb urine dipstick: NEGATIVE
Ketones, ur: NEGATIVE mg/dL
Leukocytes,Ua: NEGATIVE
Nitrite: NEGATIVE
Protein, ur: NEGATIVE mg/dL
Specific Gravity, Urine: 1.016 (ref 1.005–1.030)
pH: 7 (ref 5.0–8.0)

## 2023-08-17 LAB — GLUCOSE, CAPILLARY
Glucose-Capillary: 93 mg/dL (ref 70–99)
Glucose-Capillary: 162 mg/dL — ABNORMAL HIGH (ref 70–99)

## 2023-08-17 LAB — TROPONIN I (HIGH SENSITIVITY)
Troponin I (High Sensitivity): 6 ng/L (ref <18)
Troponin I (High Sensitivity): 6 ng/L (ref <18)

## 2023-08-17 LAB — CBG MONITORING, ED
Glucose-Capillary: 138 mg/dL — ABNORMAL HIGH (ref 70–99)
Glucose-Capillary: 99 mg/dL (ref 70–99)

## 2023-08-17 LAB — RESP PANEL BY RT-PCR (RSV, FLU A&B, COVID)  RVPGX2
Influenza A by PCR: NEGATIVE
Influenza B by PCR: NEGATIVE
Resp Syncytial Virus by PCR: NEGATIVE
SARS Coronavirus 2 by RT PCR: NEGATIVE

## 2023-08-17 LAB — HIV ANTIBODY (ROUTINE TESTING W REFLEX): HIV Screen 4th Generation wRfx: NONREACTIVE

## 2023-08-17 LAB — HEMOGLOBIN A1C
Hgb A1c MFr Bld: 7.6 % — ABNORMAL HIGH (ref 4.8–5.6)
Mean Plasma Glucose: 171.42 mg/dL

## 2023-08-17 MED ORDER — ATORVASTATIN CALCIUM 10 MG PO TABS
10.0000 mg | ORAL_TABLET | Freq: Every day | ORAL | Status: DC
  Administered 2023-08-17 – 2023-08-19 (×3): 10 mg via ORAL
  Filled 2023-08-17 (×3): qty 1

## 2023-08-17 MED ORDER — STROKE: EARLY STAGES OF RECOVERY BOOK
Freq: Once | Status: AC
  Filled 2023-08-17: qty 1

## 2023-08-17 MED ORDER — CYCLOBENZAPRINE HCL 10 MG PO TABS
10.0000 mg | ORAL_TABLET | Freq: Two times a day (BID) | ORAL | Status: DC
Start: 2023-08-17 — End: 2023-08-19
  Administered 2023-08-17 – 2023-08-19 (×5): 10 mg via ORAL
  Filled 2023-08-17 (×5): qty 1

## 2023-08-17 MED ORDER — ENOXAPARIN SODIUM 40 MG/0.4ML IJ SOSY
40.0000 mg | PREFILLED_SYRINGE | INTRAMUSCULAR | Status: DC
  Administered 2023-08-17 – 2023-08-19 (×3): 40 mg via SUBCUTANEOUS
  Filled 2023-08-17 (×3): qty 0.4

## 2023-08-17 MED ORDER — SODIUM CHLORIDE 0.9% FLUSH: 3.0000 mL | INTRAVENOUS | Status: DC | PRN

## 2023-08-17 MED ORDER — ENOXAPARIN SODIUM 40 MG/0.4ML IJ SOSY: 40.0000 mg | PREFILLED_SYRINGE | INTRAMUSCULAR | Status: DC

## 2023-08-17 MED ORDER — ACETAMINOPHEN 325 MG PO TABS: 650.0000 mg | ORAL_TABLET | ORAL | Status: DC | PRN

## 2023-08-17 MED ORDER — LISINOPRIL 2.5 MG PO TABS
2.5000 mg | ORAL_TABLET | Freq: Every day | ORAL | Status: DC
  Administered 2023-08-18 – 2023-08-19 (×2): 2.5 mg via ORAL
  Filled 2023-08-17 (×3): qty 1

## 2023-08-17 MED ORDER — ACETAMINOPHEN 650 MG RE SUPP: 650.0000 mg | RECTAL | Status: DC | PRN

## 2023-08-17 MED ORDER — ACETAMINOPHEN 160 MG/5ML PO SOLN: 650.0000 mg | ORAL | Status: DC | PRN

## 2023-08-17 MED ORDER — SENNOSIDES-DOCUSATE SODIUM 8.6-50 MG PO TABS: 1.0000 | ORAL_TABLET | Freq: Every evening | ORAL | Status: DC | PRN

## 2023-08-17 MED ORDER — MECLIZINE HCL 25 MG PO TABS
12.2500 mg | ORAL_TABLET | Freq: Four times a day (QID) | ORAL | Status: DC | PRN
Start: 2023-08-17 — End: 2023-08-19
  Administered 2023-08-18 – 2023-08-19 (×3): 12.5 mg via ORAL
  Filled 2023-08-17 (×4): qty 0.5

## 2023-08-17 MED ORDER — ASPIRIN 81 MG PO TBEC
81.0000 mg | DELAYED_RELEASE_TABLET | Freq: Every day | ORAL | Status: DC
  Administered 2023-08-17 – 2023-08-19 (×3): 81 mg via ORAL
  Filled 2023-08-17 (×3): qty 1

## 2023-08-17 MED ORDER — SODIUM CHLORIDE 0.9% FLUSH
3.0000 mL | Freq: Two times a day (BID) | INTRAVENOUS | Status: DC
  Administered 2023-08-17 (×2): 5 mL via INTRAVENOUS
  Administered 2023-08-18 – 2023-08-19 (×3): 10 mL via INTRAVENOUS

## 2023-08-17 MED ORDER — ONDANSETRON HCL 4 MG/2ML IJ SOLN
4.0000 mg | Freq: Once | INTRAMUSCULAR | Status: AC
  Administered 2023-08-17: 4 mg via INTRAVENOUS
  Filled 2023-08-17: qty 2

## 2023-08-17 MED ORDER — INSULIN ASPART 100 UNIT/ML IJ SOLN
0.0000 [IU] | Freq: Three times a day (TID) | INTRAMUSCULAR | Status: DC
  Administered 2023-08-18 (×2): 1 [IU] via SUBCUTANEOUS
  Administered 2023-08-18: 2 [IU] via SUBCUTANEOUS
  Administered 2023-08-19: 1 [IU] via SUBCUTANEOUS
  Filled 2023-08-17 (×3): qty 1

## 2023-08-17 MED ORDER — LORAZEPAM 1 MG PO TABS: 1.0000 mg | ORAL_TABLET | ORAL | Status: DC | PRN

## 2023-08-17 MED ORDER — PANTOPRAZOLE SODIUM 40 MG PO TBEC
40.0000 mg | DELAYED_RELEASE_TABLET | Freq: Every day | ORAL | Status: DC
  Administered 2023-08-17 – 2023-08-19 (×3): 40 mg via ORAL
  Filled 2023-08-17 (×3): qty 1

## 2023-08-17 MED ORDER — SODIUM CHLORIDE 0.9 % IV BOLUS
1000.0000 mL | Freq: Once | INTRAVENOUS | Status: AC
  Administered 2023-08-17: 1000 mL via INTRAVENOUS

## 2023-08-17 MED ORDER — GABAPENTIN 100 MG PO CAPS
100.0000 mg | ORAL_CAPSULE | Freq: Two times a day (BID) | ORAL | Status: DC
  Administered 2023-08-17 – 2023-08-19 (×5): 100 mg via ORAL
  Filled 2023-08-17 (×5): qty 1

## 2023-08-17 MED ORDER — TRAMADOL HCL 50 MG PO TABS: 50.0000 mg | ORAL_TABLET | Freq: Two times a day (BID) | ORAL | Status: DC | PRN

## 2023-08-17 MED ORDER — MECLIZINE HCL 25 MG PO TABS
25.0000 mg | ORAL_TABLET | Freq: Once | ORAL | Status: AC
  Administered 2023-08-17: 25 mg via ORAL
  Filled 2023-08-17: qty 1

## 2023-08-17 NOTE — H&P (Signed)
 History and Physical    Meghan Vasquez ZOX:[id number] DOB: [birth year] DOA: 08/16/2023  PCP: Lorina Roosevelt, MD (Confirm with patient/family/NH records and if not entered, this has to be entered at [facility] point of entry) Patient coming from: Home  I have personally briefly reviewed patient's old medical records in [facility]  Chief Complaint: Spinning sensation, feeling weak  HPI: Meghan Vasquez is a 63 y.o. female with medical history significant of IIDM, HTN, CKD stage II, neuropathy, chronic back pain, sciatica, on multiple pain medication including gabapentin, presented with new onset of generalized weakness and vertigo.  Symptoms started yesterday morning, patient started to feel generalized weakness of both arms and legs with tingling sensation, initially she attributed her symptoms to worsening of diabetes neuropathy, she went to bed early last night.  Woke up before midnight to go to bathroom and immediately felt spinning sensation, had to hold her phone and she was unable to avoid fall.  She went back to lying on the bed however this morning, even opening eyes close significant vertigo symptoms.  Denied any new weakness numbness of any of the limbs.  She said that her glucose has been running high and she never had hypoglycemia episode before.  She also felt nauseous and vomited once with stomach content nonbloody but no abdominal pain.  Review of her medications showed that she has not been taking any new medications follow-up 3 months and no dosage adjustment needed.  ED Course: Afebrile, nontachycardic Hypotension O2 sat of 99% on room air.  CT head negative for acute findings, blood work showed hemoglobin 11.2 WBC 9.8 creatinine 1.6 compared to baseline 1.2-1.4.  Patient was given meclizine  and Zofran  in the ED  Review of Systems: As per HPI otherwise 14 point review of systems negative.    Past Medical History:  Diagnosis Date   Anemia    Arthritis    Chronic pain syndrome     Chronic sciatica    Diabetes mellitus without complication (HCC)    Family history of adverse reaction to anesthesia    daughter hard to wake up   GERD (gastroesophageal reflux disease)    Headache    Lumbar radiculopathy    Sciatica    Workers Comp    Past Surgical History:  Procedure Laterality Date   CARPAL TUNNEL RELEASE Right 05/29/2022   Procedure: CARPAL TUNNEL RELEASE ENDOSCOPIC;  Surgeon: Elner Hahn, MD;  Location: [facility] ORS;  Service: Orthopedics;  Laterality: Right;   COLONOSCOPY  2013   COLONOSCOPY WITH PROPOFOL  N/A 06/18/2019   Procedure: COLONOSCOPY WITH PROPOFOL ;  Surgeon: Luke Salaam, MD;  Location: [facility] ENDOSCOPY;  Service: Gastroenterology;  Laterality: N/A;   etopic pregnancy     FLEXIBLE SIGMOIDOSCOPY N/A 06/24/2019   Procedure: FLEXIBLE SIGMOIDOSCOPY;  Surgeon: Luke Salaam, MD;  Location: [facility] ENDOSCOPY;  Service: Gastroenterology;  Laterality: N/A;  NON-SEDATED     reports that she has never smoked. She has never used smokeless tobacco. She reports that she does not drink alcohol and does not use drugs.  No Known Allergies  Family History  Problem Relation Age of Onset   Diabetes Mother    Emphysema Brother    Other Daughter        Scarcoidosis   Aplastic anemia Daughter    Kidney disease Daughter    Cancer Daughter        Breast Bilateral Mastectomy age 60, kidney    Breast cancer Daughter      Prior to Admission medications  Medication Sig Start Date End Date Taking? Authorizing Provider  acetaminophen  (TYLENOL ) 650 MG CR tablet Take 650-1,300 mg by mouth every 8 (eight) hours as needed for pain.    [provider]  Ascorbic Acid (VITAMIN C PO) Take 1 tablet by mouth daily.    [provider]  aspirin EC 81 MG tablet Take 81 mg by mouth daily. Swallow whole.    [provider]  atorvastatin (LIPITOR) 10 MG tablet Take 10 mg by mouth daily.    [provider]  Calcium-Magnesium-Vitamin D  (CALCIUM MAGNESIUM PO)  Take 1 tablet by mouth daily.    [provider]  Cyanocobalamin (VITAMIN B-12 PO) Take 1 tablet by mouth daily.    [provider]  cyclobenzaprine  (FLEXERIL ) 10 MG tablet Take 10 mg by mouth 2 (two) times daily. 09/20/14   [provider]  gabapentin (NEURONTIN) 300 MG capsule Take 100 mg by mouth See admin instructions. 2x daily M-F 3x daily on weekend only    [provider]  Glucose Blood (GLUCOSE METER TEST VI) as directed.    [provider]  glucose blood test strip 1 each by Other route in the morning, at noon, and at bedtime.    [provider]  JARDIANCE 10 MG TABS tablet Take 10 mg by mouth daily. 07/25/22   [provider]  Lancets 33G MISC 1 Lancet by Does not apply route daily.    [provider]  lisinopril (ZESTRIL) 2.5 MG tablet Take 2.5 mg by mouth daily. 09/08/22   [provider]  meclizine  (ANTIVERT ) 25 MG tablet Take 12.25 mg by mouth every 6 (six) hours as needed for dizziness.    [provider]  Misc. Devices MISC by Does not apply route. Walk-in tub Massage chair    [provider]  pantoprazole (PROTONIX) 40 MG tablet Take 40 mg by mouth daily as needed.    [provider]  traMADol  (ULTRAM ) 50 MG tablet Take 1 tablet (50 mg total) by mouth every 6 (six) hours as needed for severe pain. Patient taking differently: Take 50 mg by mouth 2 (two) times daily as needed for severe pain. 05/13/17 09/17/22  [name], MD  VITAMIN E PO Take 1 tablet by mouth daily.    [provider]    Physical Exam: Vitals:   08/17/23 0600 08/17/23 0615 08/17/23 0700 08/17/23 0800  BP: 126/73  121/76 133/79  Pulse: 67 66 73 74  Resp: 11 11 13 17   Temp:      TempSrc:      SpO2: 99% 99% 99% 98%    Constitutional: NAD, calm, comfortable Vitals:   08/17/23 0600 08/17/23 0615 08/17/23 0700 08/17/23 0800  BP: 126/73  121/76 133/79  Pulse: 67 66 73 74  Resp: 11 11 13  17   Temp:      TempSrc:      SpO2: 99% 99% 99% 98%   Eyes: PERRL, lids and conjunctivae normal ENMT: Mucous membranes are moist. Posterior pharynx clear of any exudate or lesions.Normal dentition.  Neck: normal, supple, no masses, no thyromegaly Respiratory: clear to auscultation bilaterally, no wheezing, no crackles. Normal respiratory effort. No accessory muscle use.  Cardiovascular: Regular rate and rhythm, no murmurs / rubs / gallops. No extremity edema. 2+ pedal pulses. No carotid bruits.  Abdomen: no tenderness, no masses palpated. No hepatosplenomegaly. Bowel sounds positive.  Musculoskeletal: no clubbing / cyanosis. No joint deformity upper and lower extremities. Good ROM, no contractures. Normal muscle  tone.  Skin: no rashes, lesions, ulcers. No induration Neurologic: CN 2-12 grossly intact. Sensation intact, DTR normal. Strength 5/5 in all 4.  Positive Dix-Hallpike maneuver, with 2 beats of horizontal nystagmus Psychiatric: Normal judgment and insight. Alert and oriented x 3. Normal mood.     Labs on Admission: I have personally reviewed following labs and imaging studies  CBC: Recent Labs  Lab 08/17/23 0000  WBC 9.8  NEUTROABS 4.2  HGB 11.2*  HCT 35.3*  MCV 91.5  PLT 261   Basic Metabolic Panel: Recent Labs  Lab 08/17/23 0000  NA 138  K 3.9  CL 104  CO2 26  GLUCOSE 142*  BUN 24*  CREATININE 1.69*  CALCIUM 8.9   GFR: CrCl cannot be calculated (Unknown ideal weight.). Liver Function Tests: Recent Labs  Lab 08/17/23 0000  AST 24  ALT 21  ALKPHOS 63  BILITOT 0.4  PROT 7.4  ALBUMIN 4.0   No results for input(s): "LIPASE", "AMYLASE" in the last 168 hours. No results for input(s): "AMMONIA" in the last 168 hours. Coagulation Profile: No results for input(s): "INR", "PROTIME" in the last 168 hours. Cardiac Enzymes: No results for input(s): "CKTOTAL", "CKMB", "CKMBINDEX", "TROPONINI" in the last 168 hours. BNP (last 3 results) No results for input(s):  "PROBNP" in the last 8760 hours. HbA1C: No results for input(s): "HGBA1C" in the last 72 hours. CBG: Recent Labs  Lab 08/16/23 2357  GLUCAP 138*   Lipid Profile: No results for input(s): "CHOL", "HDL", "LDLCALC", "TRIG", "CHOLHDL", "LDLDIRECT" in the last 72 hours. Thyroid Function Tests: No results for input(s): "TSH", "T4TOTAL", "FREET4", "T3FREE", "THYROIDAB" in the last 72 hours. Anemia Panel: No results for input(s): "VITAMINB12", "FOLATE", "FERRITIN", "TIBC", "IRON", "RETICCTPCT" in the last 72 hours. Urine analysis:    Component Value Date/Time   COLORURINE STRAW (A) 08/17/2023 0049   APPEARANCEUR CLEAR (A) 08/17/2023 0049   APPEARANCEUR Clear 12/14/2015 1359   LABSPEC 1.016 08/17/2023 0049   PHURINE 7.0 08/17/2023 0049   GLUCOSEU >=500 (A) 08/17/2023 0049   HGBUR NEGATIVE 08/17/2023 0049   BILIRUBINUR NEGATIVE 08/17/2023 0049   BILIRUBINUR Negative 12/14/2015 1359   KETONESUR NEGATIVE 08/17/2023 0049   PROTEINUR NEGATIVE 08/17/2023 0049   NITRITE NEGATIVE 08/17/2023 0049   LEUKOCYTESUR NEGATIVE 08/17/2023 0049    Radiological Exams on Admission: CT Head Wo Contrast Result Date: 08/17/2023 CLINICAL DATA:  Mental status change, unknown cause. EXAM: CT HEAD WITHOUT CONTRAST TECHNIQUE: Contiguous axial images were obtained from the base of the skull through the vertex without intravenous contrast. RADIATION DOSE REDUCTION: This exam was performed according to the departmental dose-optimization program which includes automated exposure control, adjustment of the mA and/or kV according to patient size and/or use of iterative reconstruction technique. COMPARISON:  CT head Sep 09, 2017. FINDINGS: Brain: No evidence of acute infarction, hemorrhage, hydrocephalus, extra-axial collection or mass lesion/mass effect. Partially empty sella. Vascular: No hyperdense vessel. Skull: No acute fracture. Sinuses/Orbits: Clear sinuses. Remote right medial orbital wall fracture. Other: No mastoid  effusions. IMPRESSION: No evidence of acute intracranial abnormality. Electronically Signed   By: Stevenson Elbe M.D.   On: 08/17/2023 01:29   DG Chest Port 1 View Result Date: 08/17/2023 CLINICAL DATA:  Weakness EXAM: PORTABLE CHEST 1 VIEW COMPARISON:  09/17/2022 FINDINGS: Heart and mediastinal contours are within normal limits. No focal opacities or effusions. No acute bony abnormality. IMPRESSION: No active disease. Electronically Signed   By: Janeece Mechanic M.D.   On: 08/17/2023 00:41    EKG: Independently reviewed.  Sinus rhythm, no acute ST changes.  Assessment/Plan Principal Problem:   Generalized weakness Active Problems:   Vertigo  (please populate well all problems here in Problem List. (For example, if patient is on BP meds at home and you resume or decide to hold them, it is a problem that needs to be her. Same for CAD, COPD, HLD and so on)  Vertigo with nystagmus - Rule out central vertigo, MRI ordered to rule out stroke - Continue meclizine  as needed, and PT evaluation - Continue aspirin and statin - Other DDx, less likely caused by hypoglycemia, will check A1c and 3 times daily AC fingersticks.  Patient denied any history of sinusitis but does report that she has seasonal allergy, and the only symptom is sneezing but denies any nose or throat symptoms, overall low suspicion for inner ear inflammation.  Also open for patient's medication showed patient has been taking the same oral gabapentin and pain medication, polypharmacy also less likely.  Symptoms appear to be noncompatible with near syncope, telemonitoring showed normal sinus rhythm, low suspicion for arrhythmia.  IIDM - Continue Jardiance - SSI  Chronic pain syndrome - Continue as needed follow  CKD stage III - Slightly worsening of kidney function, blood.  Has baseline diabetic nephropathy, continue low-dose of lisinopril - UA negative for protein or UTI - Outpatient follow-up with nephrology   DVT  prophylaxis: Lovenox Code Status: Full code Family Communication: None at bedside Disposition Plan: Expect less than 2 midnight hospital Consults called: None Admission status: Telemetry observation   [place] MD [facility] Pager [phone]  08/17/2023, 9:26 AM

## 2023-08-17 NOTE — Progress Notes (Signed)
 SLP Cancellation Note  Patient Details Name: Meghan Vasquez MRN: [id number] DOB: 01/11/1961   Cancelled treatment:       Reason Eval/Treat Not Completed: SLP screened, no needs identified, will sign off  Chart review completed. MRI revealing, "Negative MRI of the brain. No acute or focal lesion to explain the patient's symptoms." Pt admitted for general weakness/vertigo with nystagmus. Pt denied cognitive communication concerns/variance from baseline. Pt answered questions appropriately and oriented to self, situation, and location.   No further acute SLP services indicated at this time.   [place] Eugenio Dollins Clapp, MS, CCC-SLP Speech Language Pathologist Rehab Services; [facility] [phone] (ascom)    [place] J Clapp 08/17/2023, 3:09 PM

## 2023-08-17 NOTE — ED Notes (Signed)
 Pt assisted to standing. Pt reported feeling weak and had to sit back down.

## 2023-08-17 NOTE — ED Notes (Signed)
 Pt adds that when she moves she feels dizzy and that this began at 23:15.

## 2023-08-17 NOTE — ED Notes (Signed)
 Pt assisted on bedpan.

## 2023-08-17 NOTE — ED Provider Notes (Signed)
 [facility] Provider Note    Event Date/Time   First MD Initiated Contact with Patient 08/17/23 0006     (approximate)   History   Weakness   HPI  Meghan Vasquez is a 63 y.o. female brought to the ED by her brother from home with a chief complaint of dizziness and generalized weakness.  Patient reports she took her nighttime medicines early tonight so she could go to sleep.  Awoke to use the restroom, was dizzy.  Unable to get herself off the commode after she was finished.  Staff had to help patient out of the car secondary to generalized weakness.  Endorses associated nausea.  Denies recent fever/chills, chest pain, shortness of breath, abdominal pain, vomiting, dysuria or diarrhea.     Past Medical History   Past Medical History:  Diagnosis Date   Anemia    Arthritis    Chronic pain syndrome    Chronic sciatica    Diabetes mellitus without complication (HCC)    Family history of adverse reaction to anesthesia    daughter hard to wake up   GERD (gastroesophageal reflux disease)    Headache    Lumbar radiculopathy    Sciatica    Workers Comp     Active Problem List   Patient Active Problem List   Diagnosis Date Noted   Spondylosis without myelopathy or radiculopathy, lumbar region 06/03/2017   Lumbar facet hypertrophy (Multilevel) (Bilateral) 05/23/2017   Lumbar facet syndrome (Bilateral) (R>L) 04/29/2017   Osteoarthritis of knee (Left) 04/29/2017   Tricompartment osteoarthritis of knee (Left) 04/29/2017   DDD (degenerative disc disease), lumbar 04/29/2017   Lumbar facet osteoarthritis 04/29/2017   Osteoarthritis of  Lumbar spine 04/29/2017   Chronic lumbar radicular pain (R) (L5) 04/29/2017   Chronic low back pain (Primary Area of Pain) (Bilateral) (R>L) 04/17/2017   Chronic lower extremity pain (Secondary Area of Pain) (Bilateral) (R>L) 04/17/2017   Chronic knee pain (Tertiary Area of Pain) (Left) 04/17/2017   Chronic pain syndrome  04/17/2017   Disorder of bone, unspecified 04/17/2017   Other specified health status 04/17/2017   Other long term (current) drug therapy 04/17/2017   Skin cyst 09/05/2015   Breast cyst 09/05/2015   Anemia 02/01/2015   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   Perimenopause 01/31/2015   Controlled diabetes mellitus with chronic kidney disease (HCC)      Past Surgical History   Past Surgical History:  Procedure Laterality Date   CARPAL TUNNEL RELEASE Right 05/29/2022   Procedure: CARPAL TUNNEL RELEASE ENDOSCOPIC;  Surgeon: Elner Hahn, MD;  Location: [facility] ORS;  Service: Orthopedics;  Laterality: Right;   COLONOSCOPY  2013   COLONOSCOPY WITH PROPOFOL  N/A 06/18/2019   Procedure: COLONOSCOPY WITH PROPOFOL ;  Surgeon: Luke Salaam, MD;  Location: [facility] ENDOSCOPY;  Service: Gastroenterology;  Laterality: N/A;   etopic pregnancy     FLEXIBLE SIGMOIDOSCOPY N/A 06/24/2019   Procedure: FLEXIBLE SIGMOIDOSCOPY;  Surgeon: Luke Salaam, MD;  Location: [facility] ENDOSCOPY;  Service: Gastroenterology;  Laterality: N/A;  NON-SEDATED     Home Medications   Prior to Admission medications   Medication Sig Start Date End Date Taking? Authorizing Provider  acetaminophen  (TYLENOL ) 650 MG CR tablet Take 650-1,300 mg by mouth every 8 (eight) hours as needed for pain.    [provider]  Ascorbic Acid (VITAMIN C PO) Take 1 tablet by mouth daily.    [provider]  aspirin EC 81 MG tablet Take 81 mg by mouth daily. Swallow  whole.    [provider]  atorvastatin (LIPITOR) 10 MG tablet Take 10 mg by mouth daily.    [provider]  Calcium-Magnesium-Vitamin D  (CALCIUM MAGNESIUM PO) Take 1 tablet by mouth daily.    [provider]  Cyanocobalamin (VITAMIN B-12 PO) Take 1 tablet by mouth daily.    [provider]  cyclobenzaprine  (FLEXERIL ) 10 MG tablet Take 10 mg by mouth 2 (two) times daily. 09/20/14   [provider]  gabapentin (NEURONTIN) 300 MG capsule  Take 100 mg by mouth See admin instructions. 2x daily M-F 3x daily on weekend only    [provider]  Glucose Blood (GLUCOSE METER TEST VI) as directed.    [provider]  glucose blood test strip 1 each by Other route in the morning, at noon, and at bedtime.    [provider]  JARDIANCE 10 MG TABS tablet Take 10 mg by mouth daily. 07/25/22   [provider]  Lancets 33G MISC 1 Lancet by Does not apply route daily.    [provider]  lisinopril (ZESTRIL) 2.5 MG tablet Take 2.5 mg by mouth daily. 09/08/22   [provider]  meclizine  (ANTIVERT ) 25 MG tablet Take 12.25 mg by mouth every 6 (six) hours as needed for dizziness.    [provider]  Misc. Devices MISC by Does not apply route. Walk-in tub Massage chair    [provider]  pantoprazole (PROTONIX) 40 MG tablet Take 40 mg by mouth daily as needed.    [provider]  traMADol  (ULTRAM ) 50 MG tablet Take 1 tablet (50 mg total) by mouth every 6 (six) hours as needed for severe pain. Patient taking differently: Take 50 mg by mouth 2 (two) times daily as needed for severe pain. 05/13/17 09/17/22  [name], MD  VITAMIN E PO Take 1 tablet by mouth daily.    [provider]     Allergies  Patient has no known allergies.   Family History   Family History  Problem Relation Age of Onset   Diabetes Mother    Emphysema Brother    Other Daughter        Scarcoidosis   Aplastic anemia Daughter    Kidney disease Daughter    Cancer Daughter        Breast Bilateral Mastectomy age 12, kidney    Breast cancer Daughter      Physical Exam  Triage Vital Signs: ED Triage Vitals  Encounter Vitals Group     BP 08/16/23 2359 (!) 159/88     Systolic BP Percentile --      Diastolic BP Percentile --      Pulse Rate 08/16/23 2359 70     Resp --      Temp 08/16/23 2359 98.4 F (36.9 C)     Temp Source 08/16/23 2359 Oral     SpO2 08/16/23 2359 99 %      Weight --      Height --      Head Circumference --      Peak Flow --      Pain Score 08/17/23 0005 0     Pain Loc --      Pain Education --      Exclude from Growth Chart --     Updated Vital Signs: BP (!) 145/77   Pulse 81   Temp 98.4 F (36.9 C) (Oral)   Resp 16   LMP 06/20/2017   SpO2 98%  General: Awake, mild distress.  CV:  RRR.  Good peripheral perfusion.  Resp:  Normal effort.  CTAB. Abd:  Nontender.  No distention.  Other:  Mildly dry mucous membranes.  Alert and oriented x 3.  CN II-XII grossly intact.  5/5 motor strength and sensation all extremities.  No carotid bruits.  Supple neck without meningismus.   ED Results / Procedures / Treatments  Labs (all labs ordered are listed, but only abnormal results are displayed) Labs Reviewed  COMPREHENSIVE METABOLIC PANEL WITH GFR - Abnormal; Notable for the following components:      Result Value   Glucose, Bld 142 (*)    BUN 24 (*)    Creatinine, Ser 1.69 (*)    GFR, Estimated 34 (*)    All other components within normal limits  URINALYSIS, ROUTINE W REFLEX MICROSCOPIC - Abnormal; Notable for the following components:   Color, Urine STRAW (*)    APPearance CLEAR (*)    Glucose, UA >=500 (*)    All other components within normal limits  CBC WITH DIFFERENTIAL/PLATELET - Abnormal; Notable for the following components:   RBC 3.86 (*)    Hemoglobin 11.2 (*)    HCT 35.3 (*)    Lymphs Abs 4.6 (*)    All other components within normal limits  CBG MONITORING, ED - Abnormal; Notable for the following components:   Glucose-Capillary 138 (*)    All other components within normal limits  RESP PANEL BY RT-PCR (RSV, FLU A&B, COVID)  [id number]  TROPONIN I (HIGH SENSITIVITY)  TROPONIN I (HIGH SENSITIVITY)     EKG  ED ECG REPORT I, Cyrene Gharibian J, the attending physician, personally viewed and interpreted this ECG.   Date: 08/17/2023  EKG Time: 0001  Rate: 72  Rhythm: normal sinus rhythm  Axis: Normal   Intervals:none  ST&T Change: Nonspecific    RADIOLOGY I have independently visualized and interpreted patient's imaging study as well as noted the radiology interpretation:  Chest x-ray: No acute cardiopulmonary process  Official radiology report(s): CT Head Wo Contrast Result Date: 08/17/2023 CLINICAL DATA:  Mental status change, unknown cause. EXAM: CT HEAD WITHOUT CONTRAST TECHNIQUE: Contiguous axial images were obtained from the base of the skull through the vertex without intravenous contrast. RADIATION DOSE REDUCTION: This exam was performed according to the departmental dose-optimization program which includes automated exposure control, adjustment of the mA and/or kV according to patient size and/or use of iterative reconstruction technique. COMPARISON:  CT head Sep 09, 2017. FINDINGS: Brain: No evidence of acute infarction, hemorrhage, hydrocephalus, extra-axial collection or mass lesion/mass effect. Partially empty sella. Vascular: No hyperdense vessel. Skull: No acute fracture. Sinuses/Orbits: Clear sinuses. Remote right medial orbital wall fracture. Other: No mastoid effusions. IMPRESSION: No evidence of acute intracranial abnormality. Electronically Signed   By: Stevenson Elbe M.D.   On: 08/17/2023 01:29   DG Chest Port 1 View Result Date: 08/17/2023 CLINICAL DATA:  Weakness EXAM: PORTABLE CHEST 1 VIEW COMPARISON:  09/17/2022 FINDINGS: Heart and mediastinal contours are within normal limits. No focal opacities or effusions. No acute bony abnormality. IMPRESSION: No active disease. Electronically Signed   By: Janeece Mechanic M.D.   On: 08/17/2023 00:41     PROCEDURES:  Critical Care performed: No  .1-3 Lead EKG Interpretation  Performed by: Norlene Beavers, MD Authorized by: Norlene Beavers, MD     Interpretation: normal     ECG rate:  70   ECG rate assessment: normal     Rhythm: sinus rhythm  Ectopy: none     Conduction: normal   Comments:     Patient placed on cardiac  monitor to evaluate for arrhythmias    MEDICATIONS ORDERED IN ED: Medications  meclizine  (ANTIVERT ) tablet 25 mg (has no administration in time range)  ondansetron  (ZOFRAN ) injection 4 mg (4 mg Intravenous Given 08/17/23 0110)  sodium chloride  0.9 % bolus 1,000 mL (1,000 mLs Intravenous New Bag/Given 08/17/23 0110)     IMPRESSION / MDM / ASSESSMENT AND PLAN / ED COURSE  I reviewed the triage vital signs and the nursing notes.                             63 year old female presenting with generalized weakness and dizziness.  Differential diagnosis includes but is not limited to CVA, ACS, infectious, metabolic etiologies, etc.  I personally reviewed patient's records and note a nephrology office visit from 07/23/2023 for follow-up CKD stage IIIa and hypertension.  Patient's presentation is most consistent with acute complicated illness / injury requiring diagnostic workup.  The patient is on the cardiac monitor to evaluate for evidence of arrhythmia and/or significant heart rate changes.  Laboratory results demonstrate normal WBC 9.8, AKI with creatinine 1.69, up from her baseline around 1.4.  UA negative.  Pending troponin and respiratory panel.  Chest x-ray negative.  Obtain CT head.  Initiate IV fluids, Zofran  for nausea.  Patient reports dizziness has improved at this time.  Will reassess.  Clinical Course as of 08/17/23 4098  Sat Aug 17, 2023  0213 Updated patient and brother on all laboratory and imaging results.  Patient still feeling overall weak and dizzy.  Will add meclizine .  Will consult hospitalist services for evaluation and admission. [JS]    Clinical Course User Index [JS] Norlene Beavers, MD     FINAL CLINICAL IMPRESSION(S) / ED DIAGNOSES   Final diagnoses:  Generalized weakness  Dizziness  AKI (acute kidney injury) (HCC)     Rx / DC Orders   ED Discharge Orders     None        Note:  This document was prepared using Dragon voice recognition software and may  include unintentional dictation errors.   Daimien Patmon J, MD 08/17/23 [id number]

## 2023-08-17 NOTE — ED Triage Notes (Signed)
 Pt was brought in by POV by her brother.  Pt called for her brother and he found her half on the bed after having gone to the bathroom.  She was very weak.  Staff helped her out of the car.  Pt denies any CP or sob but states that she feels very weak.  No focal weakness.

## 2023-08-17 NOTE — ED Notes (Signed)
 Pt beginning to awaken. Pt eyes open and he readjusted himself in bed.

## 2023-08-17 NOTE — Evaluation (Signed)
 Physical Therapy Evaluation Patient Details Name: Meghan Vasquez MRN: [id number] DOB: 03-13-1961 Today's Date: 08/17/2023  History of Present Illness  Pt admitted to [facility] on 08/16/23 under observation for c/o generalized weakness and dizziness. Positive Dix-hallpike findings upon admission. All imaging negative for acute abnormality. Significant PMH includes: T2DM, HTN, CKD stage II, neuropathy, chronic back pain, sciatica.   Clinical Impression  Pt is a 63 year old F admitted to hospital on 08/16/23 for generalized weakness and vertigo. At baseline, pt is mod I for limited household ambulation [facility]) and limited community ambulation (rollator), ADL's, IADL's (increased difficulty with these tasks), and medication management. She does not drive.   Pt presents with mild RLE weakness, diminished sensation RLE, decreased standing balance, decreased activity tolerance, and dizziness, resulting in impaired functional mobility from baseline. Due to deficits, pt required SBA-max assist for bed mobility, min assist for transfers, and CGA to ambulate short distance at bedside with RW. Pt is (+) for R Dix-Hallpike indicating R posterior canal BPPV; while no nystagmus was present, pt presents with severe room-spinning dizziness that lasts >30s. Pt politely declining Epley/Semont maneuver for treatment, but is agreeable for treatment follow up tomorrow.  Deficits limit the pt's ability to safely and independently perform ADL's, transfer, and ambulate. Pt will benefit from acute skilled PT services to address deficits for return to baseline function. Pt will benefit from post acute therapy services to address deficits for return to baseline function.         If plan is discharge home, recommend the following: A little help with walking and/or transfers;A little help with bathing/dressing/bathroom;Assistance with cooking/housework;Assist for transportation   Can travel by private vehicle   Yes    Equipment  Recommendations None recommended by PT     Functional Status Assessment Patient has had a recent decline in their functional status and demonstrates the ability to make significant improvements in function in a reasonable and predictable amount of time.     Precautions / Restrictions Precautions Precautions: Fall Precaution/Restrictions Comments: SpO2 >/= 94%      Mobility  Bed Mobility Overal bed mobility: Needs Assistance             General bed mobility comments: Max assist for supine>long sit for Dix-hallpike testing. Min assist for trunk support for BLE transition to EOB. SBA for safety for sit>supine transfer with HOB flat. Use of BUE for support. Verbal cues for safety, sequencing, and hand placement.    Transfers Overall transfer level: Needs assistance Equipment used: Rolling walker (2 wheels) Transfers: Sit to/from Stand             General transfer comment: Min assist for power to stand from EOB with RW, verbal cues for safety, sequencing, and hand placement. Increased time/effort to achieve full upright standing in RW. Min assist for controlled descent to sit EOB, demonstrating "fair" eccentric lowering with proper hand placement.    Ambulation/Gait Ambulation/Gait assistance: Contact guard assist Gait Distance (Feet): 2 Feet Assistive device: Rolling walker (2 wheels)         General Gait Details: CGA for safety to take multiple small lateral steps at bedside with RW. Demonstrates slowed cadence, narrow BOS, and decreased step length/foot clearance bilaterally. Further mobility limited secondary to increased c/o dizziness.     Balance Overall balance assessment: Needs assistance Sitting-balance support: Bilateral upper extremity supported, Feet supported Sitting balance-Leahy Scale: Fair Sitting balance - Comments: secondary to dizziness   Standing balance support: During functional activity, Reliant on assistive device  for balance, Bilateral upper  extremity supported Standing balance-Leahy Scale: Fair Standing balance comment: BUE support on RW                             Pertinent Vitals/Pain Pain Assessment Pain Assessment: No/denies pain    Home Living Family/patient expects to be discharged to:: Private residence Living Arrangements: Other relatives (Brother) Available Help at Discharge: Family;Available PRN/intermittently (brother unable to provide physical assist) Type of Home: House Home Access: Level entry       Home Layout: One level Home Equipment: Rollator (4 wheels);Cane - single point;BSC/3in1;Hand held shower head      Prior Function Prior Level of Function : Independent/Modified Independent             Mobility Comments: mod I with limited household ambulation (SPC) and limited community ambulation (rollator). Denies hx of falls. ADLs Comments: mod I with ADL's and medication management. Uses BSC at bedside. Normally mod I for IADL's but having increased difficulty managing these tasks. Does not drive.     Extremity/Trunk Assessment   Upper Extremity Assessment Upper Extremity Assessment: Defer to OT evaluation    Lower Extremity Assessment Lower Extremity Assessment: Overall WFL for tasks assessed (mild RLE weakness with hx of sciatica, diminished senation RLE L2-5. bilateral ROM WFL.)    Cervical / Trunk Assessment Cervical / Trunk Assessment: Normal  Communication   Communication Communication: No apparent difficulties    Cognition Arousal: Lethargic Behavior During Therapy: WFL for tasks assessed/performed   PT - Cognitive impairments: No apparent impairments                         Following commands: Intact       Cueing Cueing Techniques: Verbal cues, Tactile cues     General Comments General comments (skin integrity, edema, etc.): (+) R Dix-Hallpike, negative for nystagmus with severe dizziness that lasts >30s. (-) L Dix-Hallpike but does endorse mild  dizziness that lasts < 30s. Declining Epley/Semont maneuver to address R posterior canal BPPV but agreeable for performance at follow up session.    Exercises Other Exercises Other Exercises: Pt educated re: PT role/POC, DC recommendations, safety with functional mobility, vertigo (etiology, signs/symptoms, testing/treatment, prognosis, reoccurrence rate), call for help. She verbalized understanding.   Assessment/Plan    PT Assessment Patient needs continued PT services  PT Problem List Decreased activity tolerance;Decreased balance;Decreased mobility;Impaired sensation (Dizziness)       PT Treatment Interventions DME instruction;Gait training;Functional mobility training;Therapeutic activities;Therapeutic exercise;Balance training;Patient/family education;Neuromuscular re-education;Canalith reposition    PT Goals (Current goals can be found in the Care Plan section)  Acute Rehab PT Goals Patient Stated Goal: "feel better" PT Goal Formulation: With patient Time For Goal Achievement: 08/31/23 Potential to Achieve Goals: Good Additional Goals Additional Goal #1: Pt will be asymptomatic with Dix-Hallpike testing indicating resolution of symptoms and R posterior canal BPPV.    Frequency Min 2X/week        AM-PAC PT "6 Clicks" Mobility  Outcome Measure Help needed turning from your back to your side while in a flat bed without using bedrails?: None Help needed moving from lying on your back to sitting on the side of a flat bed without using bedrails?: A Lot Help needed moving to and from a bed to a chair (including a wheelchair)?: A Little Help needed standing up from a chair using your arms (e.g., wheelchair or bedside chair)?: A Little Help  needed to walk in hospital room?: A Little Help needed climbing 3-5 steps with a railing? : A Lot 6 Click Score: 17    End of Session Equipment Utilized During Treatment: Gait belt Activity Tolerance:  (limited secondary to  dizziness) Patient left: in bed;with call bell/phone within reach;with bed alarm set Nurse Communication: Mobility status (R posterior canal BPPV) PT Visit Diagnosis: Unsteadiness on feet (R26.81);Difficulty in walking, not elsewhere classified (R26.2);BPPV BPPV - Right/Left : Right    Time: 1500-1530 PT Time Calculation (min) (ACUTE ONLY): 30 min   Charges:   PT Evaluation $PT Eval Moderate Complexity: 1 Mod   PT General Charges $$ ACUTE PT VISIT: 1 Visit        Branda Cain, PT, DPT 4:15 PM,08/17/23 Physical Therapist - [facility]

## 2023-08-18 DIAGNOSIS — H8111 Benign paroxysmal vertigo, right ear: Secondary | ICD-10-CM | POA: Diagnosis present

## 2023-08-18 DIAGNOSIS — R531 Weakness: Secondary | ICD-10-CM | POA: Diagnosis not present

## 2023-08-18 LAB — BASIC METABOLIC PANEL WITH GFR
Anion gap: 10 (ref 5–15)
BUN: 21 mg/dL (ref 8–23)
CO2: 26 mmol/L (ref 22–32)
Calcium: 9.5 mg/dL (ref 8.9–10.3)
Chloride: 103 mmol/L (ref 98–111)
Creatinine, Ser: 1.39 mg/dL — ABNORMAL HIGH (ref 0.44–1.00)
GFR, Estimated: 43 mL/min — ABNORMAL LOW (ref >60)
Glucose, Bld: 179 mg/dL — ABNORMAL HIGH (ref 70–99)
Potassium: 4.3 mmol/L (ref 3.5–5.1)
Sodium: 139 mmol/L (ref 135–145)

## 2023-08-18 LAB — GLUCOSE, CAPILLARY
Glucose-Capillary: 151 mg/dL — ABNORMAL HIGH (ref 70–99)
Glucose-Capillary: 136 mg/dL — ABNORMAL HIGH (ref 70–99)
Glucose-Capillary: 147 mg/dL — ABNORMAL HIGH (ref 70–99)
Glucose-Capillary: 212 mg/dL — ABNORMAL HIGH (ref 70–99)

## 2023-08-18 LAB — LIPID PANEL
Cholesterol: 162 mg/dL (ref 0–200)
HDL: 37 mg/dL — ABNORMAL LOW (ref >40)
LDL Cholesterol: 96 mg/dL (ref 0–99)
Total CHOL/HDL Ratio: 4.4 ratio
Triglycerides: 147 mg/dL (ref <150)
VLDL: 29 mg/dL (ref 0–40)

## 2023-08-18 NOTE — Progress Notes (Signed)
  Progress Note   Patient: Meghan Vasquez RUE:[id number] DOB: 1961-01-29 DOA: 08/16/2023     0 DOS: the patient was seen and examined on 08/18/2023   Brief hospital course:  HPI on admission 08/17/23: "Christel Bielefeldt is a 63 y.o. female with medical history significant of IIDM, HTN, CKD stage II, neuropathy, chronic back pain, sciatica, on multiple pain medication including gabapentin, presented with new onset of generalized weakness and vertigo. " See H&P for full HPI on admission & ED course.  Patient was admitted for further evaluation and management as outlined below.  Assessment and Plan:  Benign Paroxysmal Positional Vertigo, Right MRI brain was negative, central cause ruled out. 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 --Monitor BMP --Renally dose meds & avoid nephrotoxins --Outpatient follow-up with nephrology      Subjective: Pt seen up in recliner today, feeling overall better but continues to have room spinning dizziness.  She is eager to attempt ambulating today.  She describes onset of symptoms today when in recliner and looking down to her right to the handle to lower her legs in the chair.  When talking with me, pt has to close her eyes to prevent symptoms. She reports symptoms will come on while looking at staff moving around in the room and has to close her eyes.    Physical Exam: Vitals:   08/18/23 0343 08/18/23 0752 08/18/23 0851 08/18/23 1726  BP: 128/73 132/76 (!) 170/90 121/68  Pulse: 79 76  85  Resp: 20     Temp: 98.5 F  (36.9 C) 97.8 F (36.6 C)  98.2 F (36.8 C)  TempSrc:  Oral  Oral  SpO2: 96% 100%  98%  Weight:      Height:       .General exam: awake, alert, no acute distress HEENT: atraumatic, clear conjunctiva, anicteric sclera, moist mucus membranes, hearing grossly normal  Respiratory system: CTAB, no wheezes, rales or rhonchi, normal respiratory effort. Cardiovascular system: normal S1/S2,  RRR, no JVD, murmurs, rubs, gallops, no pedal edema.   Gastrointestinal system: soft, NT, ND, no HSM felt, +bowel sounds. Central nervous system: A&O x3. no gross focal neurologic deficits, normal speech Extremities: moves all , no edema, normal tone Skin: dry, intact, normal temperature, normal color, No rashes, lesions or ulcers Psychiatry: normal mood, congruent affect, judgement and insight appear normal   Data Reviewed:  Notable labs --   Glucose 179 Cr 1.39  HDL 37 otherwise normal lipid panel   Family Communication: none present. Pt is able to update.  Disposition: Status is: Observation Patient has persistent vertigo symptoms that currently prevent her from ambulating, requires further improvement for safe d/c   Planned Discharge Destination: Home    Time spent: 42 minutes  Author: Montey Apa, DO 08/18/2023 6:15 PM  For on call review [email or web address].

## 2023-08-18 NOTE — Evaluation (Signed)
 Occupational Therapy Evaluation Patient Details Name: Meghan Vasquez MRN: [id number] DOB: December 10, 1960 Today's Date: 08/18/2023   History of Present Illness   Pt admitted to [facility] on 08/16/23 under observation for c/o generalized weakness and dizziness. Positive Dix-hallpike findings upon admission. All imaging negative for acute abnormality. Significant PMH includes: T2DM, HTN, CKD stage II, neuropathy, chronic back pain, sciatica.     Clinical Impressions Pt was seen for OT evaluation this date. PTA, pt was residing at home with her brother who is not able to provide any physical assist to her. Pt reports amb limited distances in the home with a SPC and use of rollator for short community distances. Mod I/IND with ADL/IADL performance. Has a walk in tub.  Pt presents to acute OT demonstrating impaired ADL performance and functional mobility 2/2 dizziness and weakness affecting her balance and safety with performance of all tasks. Pt currently requires MIN A for bed mobility to reach EOB with increased time. Reports 6/10 dizziness. BP monitored:   BP- Sitting BP- Standing at 0 minutes BP-standing at 3 minutes   08/18/23 0851 (!) 185/91 (!) 157/96 170/95  Min A for STS from EOB and taking a few small steps to the recliner. Pt set up assist for breakfast tray. Anticipate Max A for LB ADLs and +2 to assist to progress mobility d/t dizziness at this time. If that improves, pt may progress well. Pt would benefit from skilled OT services to address noted impairments and functional limitations to maximize safety and independence while minimizing falls risk and caregiver burden. Do anticipate the need for follow up OT services upon acute hospital DC.      If plan is discharge home, recommend the following:   A lot of help with bathing/dressing/bathroom;A lot of help with walking and/or transfers;Assistance with cooking/housework;Help with stairs or ramp for entrance;Assist for transportation      Functional Status Assessment   Patient has had a recent decline in their functional status and demonstrates the ability to make significant improvements in function in a reasonable and predictable amount of time.     Equipment Recommendations         Recommendations for Other Services         Precautions/Restrictions   Precautions Precautions: Fall Restrictions Weight Bearing Restrictions Per Provider Order: No     Mobility Bed Mobility Overal bed mobility: Needs Assistance Bed Mobility: Supine to Sit     Supine to sit: Min assist, HOB elevated, Used rails     General bed mobility comments: for trunkal elevation to reach EOB    Transfers Overall transfer level: Needs assistance Equipment used: Rolling walker (2 wheels) Transfers: Sit to/from Stand Sit to Stand: Min assist           General transfer comment: from EOB to RW with increased time and cueing for hand placement; reports 6/10 dizziness with orthostatic vitals taken and positive from sit to stand with improvement for 3 min stand; shakey with constant Min/CGA provided for safety; kept eyes closed most of session; anticipate Max A for LB ADLs      Balance Overall balance assessment: Needs assistance Sitting-balance support: Bilateral upper extremity supported, Feet supported Sitting balance-Leahy Scale: Fair Sitting balance - Comments: secondary to dizziness   Standing balance support: During functional activity, Reliant on assistive device for balance, Bilateral upper extremity supported Standing balance-Leahy Scale: Fair Standing balance comment: BUE support on RW  ADL either performed or assessed with clinical judgement   ADL Overall ADL's : Needs assistance/impaired Eating/Feeding: Set up;Sitting Eating/Feeding Details (indicate cue type and reason): in recliner                     Toilet Transfer: Minimal assistance;Rolling walker (2  wheels);Cueing for Chief of Staff Details (indicate cue type and reason): simulated to recliner                 Vision         Perception         Praxis         Pertinent Vitals/Pain Pain Assessment Pain Assessment: No/denies pain     Extremity/Trunk Assessment Upper Extremity Assessment Upper Extremity Assessment: Overall WFL for tasks assessed   Lower Extremity Assessment Lower Extremity Assessment: Generalized weakness (became weak with extended standing time)   Cervical / Trunk Assessment Cervical / Trunk Assessment: Normal   Communication Communication Communication: No apparent difficulties   Cognition Arousal: Lethargic Behavior During Therapy: WFL for tasks assessed/performed                                 Following commands: Intact       Cueing  General Comments   Cueing Techniques: Verbal cues;Tactile cues      Exercises Other Exercises Other Exercises: Edu on role of OT in acute setting, safety with all movement/activity, etc.   Shoulder Instructions      Home Living Family/patient expects to be discharged to:: Private residence Living Arrangements: Other relatives (Brother) Available Help at Discharge: Family;Available PRN/intermittently (brother unable to provide physical assist) Type of Home: House Home Access: Level entry     Home Layout: One level     Bathroom Shower/Tub:  (walk in tub)   Bathroom Toilet: Handicapped height Bathroom Accessibility: Yes   Home Equipment: Rollator (4 wheels);Cane - single point;BSC/3in1;Hand held shower head          Prior Functioning/Environment Prior Level of Function : Independent/Modified Independent             Mobility Comments: mod I with limited household ambulation (SPC) and limited community ambulation (rollator). Denies hx of falls. ADLs Comments: mod I with ADL's and medication management. Uses BSC at bedside. Normally mod I for IADL's but having  increased difficulty managing these tasks. Does not drive.    OT Problem List: Impaired balance (sitting and/or standing);Decreased safety awareness;Decreased activity tolerance   OT Treatment/Interventions: Self-care/ADL training;Balance training;Therapeutic activities;Therapeutic exercise;Patient/family education;DME and/or AE instruction      OT Goals(Current goals can be found in the care plan section)   Acute Rehab OT Goals Patient Stated Goal: ipmrove dizziness OT Goal Formulation: With patient Time For Goal Achievement: 09/01/23 Potential to Achieve Goals: Fair ADL Goals Pt Will Perform Lower Body Bathing: with contact guard assist;sitting/lateral leans;sit to/from stand Pt Will Perform Lower Body Dressing: with contact guard assist;sit to/from stand;sitting/lateral leans Pt Will Transfer to Toilet: with contact guard assist;regular height toilet;ambulating   OT Frequency:  Min 2X/week    Co-evaluation              AM-PAC OT "6 Clicks" Daily Activity     Outcome Measure Help from another person eating meals?: None Help from another person taking care of personal grooming?: A Little Help from another person toileting, which includes using toliet, bedpan, or urinal?: A Lot Help from another  person bathing (including washing, rinsing, drying)?: A Lot Help from another person to put on and taking off regular upper body clothing?: A Little Help from another person to put on and taking off regular lower body clothing?: A Lot 6 Click Score: 16   End of Session Equipment Utilized During Treatment: Gait belt;Rolling walker (2 wheels) Nurse Communication: Mobility status  Activity Tolerance: Patient tolerated treatment well Patient left: in chair;with call bell/phone within reach;with chair alarm set  OT Visit Diagnosis: Other abnormalities of gait and mobility (R26.89);Unsteadiness on feet (R26.81);Dizziness and giddiness (R42)                Time: 1914-7829 OT Time  Calculation (min): 27 min Charges:  OT General Charges $OT Visit: 1 Visit OT Evaluation $OT Eval Moderate Complexity: 1 Mod OT Treatments $Therapeutic Activity: 8-22 mins Sehaj Kolden, OTR/L 08/18/23, 9:04 AM  Lebaron Bautch E Dvonte Gatliff 08/18/2023, 9:00 AM

## 2023-08-18 NOTE — Care Management Obs Status (Signed)
 MEDICARE OBSERVATION STATUS NOTIFICATION   Patient Details  Name: Meghan Vasquez MRN: [id number] Date of Birth: Aug 10, 1960   Medicare Observation Status Notification Given:  Yes    Anise Kerns 08/18/2023, 11:32 AM

## 2023-08-19 DIAGNOSIS — R531 Weakness: Secondary | ICD-10-CM | POA: Diagnosis not present

## 2023-08-19 LAB — GLUCOSE, CAPILLARY: Glucose-Capillary: 132 mg/dL — ABNORMAL HIGH (ref 70–99)

## 2023-08-19 MED ORDER — MECLIZINE HCL 12.5 MG PO TABS: 12.2500 mg | ORAL_TABLET | Freq: Four times a day (QID) | ORAL | 0 refills | Status: AC | PRN

## 2023-08-19 MED ORDER — MECLIZINE HCL 25 MG PO TABS: 12.5000 mg | ORAL_TABLET | Freq: Four times a day (QID) | ORAL | Status: DC | PRN

## 2023-08-19 NOTE — TOC Transition Note (Signed)
 Transition of Care [facility]) - Discharge Note   Patient Details  Name: Meghan Vasquez MRN: [id number] Date of Birth: 03-08-1961  [facility] CM/SW Contact:  Odilia Bennett, LCSW Phone Number: 08/19/2023, 11:21 AM   Clinical Narrative:  Patient has orders to discharge home today. CSW met with patient. No family at bedside. CSW introduced role and explained that therapy recommendations would be discussed. She is agreeable to home health and prefers [facility] as they have worked with her in the past. They have accepted referral for PT and OT. Patient is agreeable to DME recommendation for RW. CSW ordered through [employer]. No further concerns. CSW signing off.  Final next level of care: Home w Home Health Services Barriers to Discharge: No Barriers Identified   Patient Goals and CMS Choice     Choice offered to / list presented to : Patient      Discharge Placement                Patient to be transferred to facility by: Brother   Patient and family notified of of transfer: 08/19/23  Discharge Plan and Services Additional resources added to the After Visit Summary for                  DME Arranged: Walker rolling DME Agency: [facility] Date DME Agency Contacted: 08/19/23   Representative spoke with at DME Agency: [facility] HH Arranged: PT, OT [facility] Agency: [facility] Date [facility] Agency Contacted: 08/19/23   Representative spoke with at [facility] Agency: [place]   Social Drivers of Health (SDOH) Interventions SDOH Screenings   Food Insecurity: Patient Declined (08/17/2023)  Housing: Patient Declined (08/17/2023)  Transportation Needs: Patient Declined (08/17/2023)  Utilities: Patient Declined (08/17/2023)  Tobacco Use: Low Risk  (08/17/2023)     Readmission Risk Interventions     No data to display

## 2023-08-19 NOTE — Plan of Care (Signed)
 Problem: Education: Goal: Knowledge of disease or condition will improve Outcome: Adequate for Discharge Goal: Knowledge of secondary prevention will improve (MUST DOCUMENT ALL) Outcome: Adequate for Discharge Goal: Knowledge of patient specific risk factors will improve (DELETE if not current risk factor) Outcome: Adequate for Discharge   Problem: Ischemic Stroke/TIA Tissue Perfusion: Goal: Complications of ischemic stroke/TIA will be minimized Outcome: Adequate for Discharge   Problem: Coping: Goal: Will verbalize positive feelings about self Outcome: Adequate for Discharge Goal: Will identify appropriate support needs Outcome: Adequate for Discharge   Problem: Health Behavior/Discharge Planning: Goal: Ability to manage health-related needs will improve Outcome: Adequate for Discharge Goal: Goals will be collaboratively established with patient/family Outcome: Adequate for Discharge   Problem: Self-Care: Goal: Ability to participate in self-care as condition permits will improve Outcome: Adequate for Discharge Goal: Verbalization of feelings and concerns over difficulty with self-care will improve Outcome: Adequate for Discharge Goal: Ability to communicate needs accurately will improve Outcome: Adequate for Discharge   Problem: Nutrition: Goal: Risk of aspiration will decrease Outcome: Adequate for Discharge Goal: Dietary intake will improve Outcome: Adequate for Discharge   Problem: Education: Goal: Ability to describe self-care measures that may prevent or decrease complications (Diabetes Survival Skills Education) will improve Outcome: Adequate for Discharge Goal: Individualized Educational Video(s) Outcome: Adequate for Discharge   Problem: Coping: Goal: Ability to adjust to condition or change in health will improve Outcome: Adequate for Discharge   Problem: Fluid Volume: Goal: Ability to maintain a balanced intake and output will improve Outcome: Adequate  for Discharge   Problem: Health Behavior/Discharge Planning: Goal: Ability to identify and utilize available resources and services will improve Outcome: Adequate for Discharge Goal: Ability to manage health-related needs will improve Outcome: Adequate for Discharge   Problem: Metabolic: Goal: Ability to maintain appropriate glucose levels will improve Outcome: Adequate for Discharge   Problem: Nutritional: Goal: Maintenance of adequate nutrition will improve Outcome: Adequate for Discharge Goal: Progress toward achieving an optimal weight will improve Outcome: Adequate for Discharge   Problem: Skin Integrity: Goal: Risk for impaired skin integrity will decrease Outcome: Adequate for Discharge   Problem: Tissue Perfusion: Goal: Adequacy of tissue perfusion will improve Outcome: Adequate for Discharge   Problem: Education: Goal: Knowledge of General Education information will improve Description: Including pain rating scale, medication(s)/side effects and non-pharmacologic comfort measures Outcome: Adequate for Discharge   Problem: Health Behavior/Discharge Planning: Goal: Ability to manage health-related needs will improve Outcome: Adequate for Discharge   Problem: Clinical Measurements: Goal: Ability to maintain clinical measurements within normal limits will improve Outcome: Adequate for Discharge Goal: Will remain free from infection Outcome: Adequate for Discharge Goal: Diagnostic test results will improve Outcome: Adequate for Discharge Goal: Respiratory complications will improve Outcome: Adequate for Discharge Goal: Cardiovascular complication will be avoided Outcome: Adequate for Discharge   Problem: Activity: Goal: Risk for activity intolerance will decrease Outcome: Adequate for Discharge   Problem: Nutrition: Goal: Adequate nutrition will be maintained Outcome: Adequate for Discharge   Problem: Coping: Goal: Level of anxiety will decrease Outcome:  Adequate for Discharge   Problem: Elimination: Goal: Will not experience complications related to bowel motility Outcome: Adequate for Discharge Goal: Will not experience complications related to urinary retention Outcome: Adequate for Discharge   Problem: Pain Managment: Goal: General experience of comfort will improve and/or be controlled Outcome: Adequate for Discharge   Problem: Safety: Goal: Ability to remain free from injury will improve Outcome: Adequate for Discharge   Problem: Skin Integrity: Goal: Risk for  impaired skin integrity will decrease Outcome: Adequate for Discharge

## 2023-08-19 NOTE — Discharge Summary (Signed)
 Physician Discharge Summary   Patient: Meghan Vasquez MRN: [id number] DOB: October 28, 1960  Admit date:     08/16/2023  Discharge date: 08/19/2023  Discharge Physician: Montey Apa   PCP: Lorina Roosevelt, MD   Recommendations at discharge:   Follow up with Primary Care in 1-2 weeks Repeat CBC, BMP at follow up Follow up on BPPV symptoms and consider referral for outpatient Vestibular Rehab if needed Consider ENT referral if BPPV symptoms persistent  Discharge Diagnoses: Principal Problem:   Generalized weakness Active Problems:   Vertigo   BPPV (benign paroxysmal positional vertigo), right  Resolved Problems:   * No resolved hospital problems. [facility] Course:  HPI on admission 08/17/23: "Meghan Vasquez is a 63 y.o. female with medical history significant of IIDM, HTN, CKD stage II, neuropathy, chronic back pain, sciatica, on multiple pain medication including gabapentin , presented with new onset of generalized weakness and vertigo. " See H&P for full HPI on admission & ED course.   Patient was admitted for further evaluation and management as outlined below  5/5 - pt feels much better today. Dizziness resolved with meclizine .  Pt ambulating without issues. Medically stable and requesting discharge home this morning.   Assessment and Plan:  Benign Paroxysmal Positional Vertigo, Right MRI brain was negative, central cause ruled out. 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 --Monitor BMP --Renally dose meds & avoid  nephrotoxins --Outpatient follow-up with nephrology  Obesity - Body mass index is 38.09 kg/m. Complicates overall care and prognosis.  Recommend lifestyle modifications including physical activity and diet for weight loss and overall long-term health.      Consultants: none Procedures performed: none  Disposition: Home Diet recommendation:  Carb modified  DISCHARGE MEDICATION: Allergies as of 08/19/2023   No Known Allergies      Medication List     TAKE these medications    acetaminophen  650 MG CR tablet Commonly known as: TYLENOL  Take 650-1,300 mg by mouth every 8 (eight) hours as needed for pain.   aspirin  EC 81 MG tablet Take 81 mg by mouth daily. Swallow whole.   atorvastatin  10 MG tablet Commonly known as: LIPITOR Take 10 mg by mouth daily.   CALCIUM  MAGNESIUM PO Take 1 tablet by mouth daily.   cyclobenzaprine  10 MG tablet Commonly known as: FLEXERIL  Take 10 mg by mouth 2 (two) times daily.   gabapentin  300 MG capsule Commonly known as: NEURONTIN  Take 300 mg by mouth See admin instructions. 2x daily M-F 3x daily on weekend only   glucose blood test strip 1 each by Other route in the morning, at noon, and at bedtime.   GLUCOSE METER TEST VI as directed.   Jardiance 25 MG Tabs tablet Generic drug: empagliflozin Take 25 mg by mouth daily.   Lancets 33G Misc 1 Lancet by Does not apply route daily.   lisinopril  2.5 MG tablet Commonly known as: ZESTRIL  Take 2.5 mg by mouth daily.   meclizine  12.5 MG tablet Commonly known as: ANTIVERT  Take 1 tablet (12.5 mg total) by mouth every 6 (six) hours as  needed for dizziness. What changed:  medication strength how much to take   Misc. Devices Misc by Does not apply route. Walk-in tub Massage chair   pantoprazole  40 MG tablet Commonly known as: PROTONIX  Take 40 mg by mouth daily as needed.   traMADol  50 MG tablet Commonly known as: ULTRAM  Take 1 tablet (50 mg total) by mouth every 6 (six) hours as  needed for severe pain. What changed: when to take this   VITAMIN B-12 PO Take 1 tablet by mouth daily.   VITAMIN C PO Take 1 tablet by mouth daily.   VITAMIN E PO Take 1 tablet by mouth daily.        Follow-up Information     [facility].   Specialty: Home Health Services Why: They will follow up with you for your home health therapy needs. Contact information: [address] [place] [address] [phone]                Discharge Exam: Meghan Vasquez Weights   08/17/23 1124  Weight: 97.5 kg   General exam: awake, alert, no acute distress HEENT: atraumatic, clear conjunctiva, anicteric sclera, moist mucus membranes, hearing grossly normal  Respiratory system: CTAB, no wheezes, rales or rhonchi, normal respiratory effort. Cardiovascular system: normal S1/S2,  RRR, no JVD, murmurs, rubs, gallops,  no pedal edema.   Gastrointestinal system: soft, NT, ND, no HSM felt, +bowel sounds. Central nervous system: A&O x3. no gross focal neurologic deficits, normal speech Extremities: moves all , no edema, normal tone Skin: dry, intact, normal temperature, normal color, No rashes, lesions or ulcers Psychiatry: normal mood, congruent affect, judgement and insight appear normal   Condition at discharge: stable  The results of significant diagnostics from this hospitalization (including imaging, microbiology, ancillary and laboratory) are listed below for reference.   Imaging Studies: MR BRAIN WO CONTRAST Result Date: 08/17/2023 CLINICAL DATA:  Neuro deficit, acute, stroke suspected. Dizziness and weakness. EXAM: MRI HEAD WITHOUT CONTRAST TECHNIQUE: Multiplanar, multiecho pulse sequences of the brain and surrounding structures were obtained without intravenous contrast. COMPARISON:  CT head without contrast 08/17/2023 FINDINGS: Brain: No acute infarct, hemorrhage, or mass lesion is present. No significant white matter lesions are present. Deep brain  nuclei are within normal limits. The ventricles are of normal size. No significant extraaxial fluid collection is present. The brainstem and cerebellum are within normal limits. Midline structures are within normal limits. Vascular: Flow is present in the major intracranial arteries. Skull and upper cervical spine: The craniocervical junction is normal. Upper cervical spine is within normal limits. Marrow signal is unremarkable. Sinuses/Orbits: The paranasal sinuses and mastoid air cells are clear. The globes and orbits are within normal limits. IMPRESSION: Negative MRI of the brain. No acute or focal lesion to explain the patient's symptoms. Electronically Signed   By: Audree Leas M.D.   On: 08/17/2023 14:51   CT Head Wo Contrast Result Date: 08/17/2023 CLINICAL DATA:  Mental status change, unknown cause. EXAM: CT HEAD WITHOUT CONTRAST TECHNIQUE: Contiguous axial images were obtained from the base of the skull through the vertex without intravenous contrast. RADIATION DOSE REDUCTION: This exam was performed according to the departmental dose-optimization program which includes automated exposure control, adjustment of the mA and/or kV according to patient size and/or use of iterative reconstruction technique. COMPARISON:  CT head Sep 09, 2017. FINDINGS: Brain: No evidence of acute infarction, hemorrhage, hydrocephalus, extra-axial collection or mass lesion/mass effect. Partially empty sella. Vascular: No hyperdense vessel. Skull:  No acute fracture. Sinuses/Orbits: Clear sinuses. Remote right medial orbital wall fracture. Other: No mastoid effusions. IMPRESSION: No evidence of acute intracranial abnormality. Electronically Signed   By: Stevenson Elbe M.D.   On: 08/17/2023 01:29   DG Chest Port 1 View Result Date: 08/17/2023 CLINICAL DATA:  Weakness EXAM: PORTABLE CHEST 1 VIEW COMPARISON:  09/17/2022 FINDINGS: Heart and mediastinal contours are within normal limits. No focal opacities or effusions. No  acute bony abnormality. IMPRESSION: No active disease. Electronically Signed   By: Janeece Mechanic M.D.   On: 08/17/2023 00:41    Microbiology: Results for orders placed or performed during the hospital encounter of 08/16/23  Resp panel by RT-PCR (RSV, Flu A&B, Covid) Anterior Nasal Swab     Status: None   Collection Time: 08/17/23 12:00 AM   Specimen: Anterior Nasal Swab  Result Value Ref Range Status   SARS Coronavirus 2 by RT PCR NEGATIVE NEGATIVE Final    Comment: (NOTE) SARS-CoV-2 target nucleic acids are NOT DETECTED.  The SARS-CoV-2 RNA is generally detectable in upper respiratory specimens during the acute phase of infection. The lowest concentration of SARS-CoV-2 viral copies this assay can detect is 138 copies/mL. A negative result does not preclude SARS-Cov-2 infection and should not be used as the sole basis for treatment or other patient management decisions. A negative result may occur with  improper specimen collection/handling, submission of specimen other than nasopharyngeal swab, presence of viral mutation(s) within the areas targeted by this assay, and inadequate number of viral copies(<138 copies/mL). A negative result must be combined with clinical observations, patient history, and epidemiological information. The expected result is Negative.  Fact Sheet for Patients:  BloggerCourse.com  Fact Sheet for Healthcare Providers:  SeriousBroker.it  This test is no t yet approved or cleared by the [place]  FDA and  has been authorized for detection and/or diagnosis of SARS-CoV-2 by FDA under an Emergency Use Authorization (EUA). This EUA will remain  in effect (meaning this test can be used) for the duration of the COVID-19 declaration under Section 564(b)(1) of the Act, 21 U.S.C.section 360bbb-3(b)(1), unless the authorization is terminated  or revoked sooner.       Influenza A by PCR NEGATIVE NEGATIVE Final    Influenza B by PCR NEGATIVE NEGATIVE Final    Comment: (NOTE) The Xpert Xpress SARS-CoV-2/FLU/RSV plus assay is intended as an aid in the diagnosis of influenza from Nasopharyngeal swab specimens and should not be used as a sole basis for treatment. Nasal washings and aspirates are unacceptable for Xpert Xpress SARS-CoV-2/FLU/RSV testing.  Fact Sheet for Patients: BloggerCourse.com  Fact Sheet for Healthcare Providers: SeriousBroker.it  This test is not yet approved or cleared by the [place]  FDA and has been authorized for detection and/or diagnosis of SARS-CoV-2 by FDA under an Emergency Use Authorization (EUA). This EUA will remain in effect (meaning this test can be used) for the duration of the COVID-19 declaration under Section 564(b)(1) of the Act, 21 U.S.C. section 360bbb-3(b)(1), unless the authorization is terminated or revoked.     Resp Syncytial Virus by PCR NEGATIVE NEGATIVE Final    Comment: (NOTE) Fact Sheet for Patients: BloggerCourse.com  Fact Sheet for Healthcare Providers: SeriousBroker.it  This test is not yet approved or cleared by the [place]  FDA and has been authorized for detection and/or diagnosis of SARS-CoV-2 by FDA under an Emergency Use Authorization (EUA). This EUA will remain in effect (meaning this test can be used) for the duration of the COVID-19 declaration under  Section 564(b)(1) of the Act, 21 U.S.C. section 360bbb-3(b)(1), unless the authorization is terminated or revoked.  Performed at [facility], [address] [place], [place] [address]     Labs: CBC: Recent Labs  Lab 08/17/23 0000  WBC 9.8  NEUTROABS 4.2  HGB 11.2*  HCT 35.3*  MCV 91.5  PLT 261   Basic Metabolic Panel: Recent Labs  Lab 08/17/23 0000 08/18/23 0441  NA 138 139  K 3.9 4.3  CL 104 103  CO2 26 26  GLUCOSE 142* 179*  BUN 24*  21  CREATININE 1.69* 1.39*  CALCIUM  8.9 9.5   Liver Function Tests: Recent Labs  Lab 08/17/23 0000  AST 24  ALT 21  ALKPHOS 63  BILITOT 0.4  PROT 7.4  ALBUMIN 4.0   CBG: Recent Labs  Lab 08/18/23 0749 08/18/23 1145 08/18/23 1724 08/18/23 2129 08/19/23 0737  GLUCAP 151* 136* 147* 212* 132*    Discharge time spent: less than 30 minutes.  Signed: Montey Apa, DO [facility] 08/23/2023

## 2023-08-19 NOTE — Progress Notes (Signed)
 Physical Therapy Treatment Patient Details Name: Meghan Vasquez MRN: [id number] DOB: [birth year] Today's Date: 08/19/2023   History of Present Illness Pt admitted to [facility] on 08/16/23 under observation for c/o generalized weakness and dizziness. Positive Dix-hallpike findings upon admission. All imaging negative for acute abnormality. Significant PMH includes: T2DM, HTN, CKD stage II, neuropathy, chronic back pain, sciatica.    PT Comments  Pt was pleasant and motivated to participate during the session and put forth good effort throughout. Pt reported feeling "much better" this date with no reported dizziness or other adverse symptoms with change of position during sup to/from sit.  Pt education provided on BPPV signs/symptoms with recommendations to report concerns to HHPT for treatment if symptoms reoccur.  Pt presented with good eccentric and concentric control and stability with transfers and was able to amb 150 feet with slow cadence but with no overt LOB.  Cues given for general safe sequencing with the RW with pt stating that she would like a RW for improved confidence/safety with amb upon discharge.  Pt will benefit from continued PT services upon discharge to safely address deficits listed in patient problem list for decreased caregiver assistance and eventual return to PLOF.         If plan is discharge home, recommend the following: A little help with walking and/or transfers;A little help with bathing/dressing/bathroom;Assistance with cooking/housework;Assist for transportation   Can travel by private vehicle     Yes  Equipment Recommendations  Rolling walker (2 wheels)    Recommendations for Other Services       Precautions / Restrictions Precautions Precautions: Fall Restrictions Weight Bearing Restrictions Per Provider Order: No     Mobility  Bed Mobility               General bed mobility comments: NT, pt in recliner pre-post session    Transfers Overall transfer  level: Needs assistance Equipment used: Rolling walker (2 wheels) Transfers: Sit to/from Stand Sit to Stand: Supervision           General transfer comment: Good eccentric and concentric control and stability with BUE assist from armrests    Ambulation/Gait Ambulation/Gait assistance: Supervision Gait Distance (Feet): 150 Feet Assistive device: Rolling walker (2 wheels) Gait Pattern/deviations: Decreased step length - right, Step-through pattern, Decreased step length - left Gait velocity: decreased     General Gait Details: Min verbal and visual cues for amb closer to the RW with upright posture for general safety; slow cadence with gait with min lean on the RW for support but generally steady with no overt LOB   Stairs             Wheelchair Mobility     Tilt Bed    Modified Rankin (Stroke Patients Only)       Balance Overall balance assessment: Needs assistance Sitting-balance support: Bilateral upper extremity supported, Feet supported Sitting balance-Leahy Scale: Good     Standing balance support: Bilateral upper extremity supported, During functional activity Standing balance-Leahy Scale: Good                              Communication Communication Communication: No apparent difficulties  Cognition Arousal: Alert Behavior During Therapy: WFL for tasks assessed/performed   PT - Cognitive impairments: No apparent impairments                         Following commands: Intact  Cueing Cueing Techniques: Verbal cues, Visual cues  Exercises      General Comments        Pertinent Vitals/Pain Pain Assessment Pain Assessment: No/denies pain    Home Living                          Prior Function            PT Goals (current goals can now be found in the care plan section) Progress towards PT goals: Progressing toward goals    Frequency    Min 2X/week      PT Plan      Co-evaluation               AM-PAC PT "6 Clicks" Mobility   Outcome Measure  Help needed turning from your back to your side while in a flat bed without using bedrails?: None Help needed moving from lying on your back to sitting on the side of a flat bed without using bedrails?: None Help needed moving to and from a bed to a chair (including a wheelchair)?: A Little Help needed standing up from a chair using your arms (e.g., wheelchair or bedside chair)?: A Little Help needed to walk in hospital room?: A Little Help needed climbing 3-5 steps with a railing? : A Little 6 Click Score: 20    End of Session Equipment Utilized During Treatment: Gait belt Activity Tolerance: Patient tolerated treatment well Patient left: in chair;with call bell/phone within reach;with chair alarm set Nurse Communication: Mobility status PT Visit Diagnosis: Unsteadiness on feet (R26.81);Difficulty in walking, not elsewhere classified (R26.2);BPPV     Time: 9604-5409 PT Time Calculation (min) (ACUTE ONLY): 23 min  Charges:    $Gait Training: 8-22 mins $Therapeutic Activity: 8-22 mins PT General Charges $$ ACUTE PT VISIT: 1 Visit                     D. Scott Nayshawn Mesta PT, DPT 08/19/23, 10:46 AM

## 2024-02-03 ENCOUNTER — Encounter: Payer: Self-pay | Admitting: Family Medicine

## 2024-02-06 ENCOUNTER — Other Ambulatory Visit: Payer: Self-pay | Admitting: Family Medicine

## 2024-02-06 DIAGNOSIS — N644 Mastodynia: Secondary | ICD-10-CM

## 2024-02-06 DIAGNOSIS — Z1231 Encounter for screening mammogram for malignant neoplasm of breast: Secondary | ICD-10-CM

## 2024-02-12 ENCOUNTER — Ambulatory Visit
Admission: RE | Admit: 2024-02-12 | Discharge: 2024-02-12 | Disposition: A | Discharge status: Home / Self Care | Source: Ambulatory Visit | Attending: Family Medicine | Admitting: Family Medicine

## 2024-02-12 DIAGNOSIS — Z1231 Encounter for screening mammogram for malignant neoplasm of breast: Secondary | ICD-10-CM

## 2024-02-12 DIAGNOSIS — N644 Mastodynia: Secondary | ICD-10-CM | POA: Diagnosis present
